# Patient Record
Sex: Male | Born: 1965 | Race: Black or African American | Hispanic: No | Marital: Single | Smoking: Never smoker
Health system: Southern US, Community
[De-identification: ages and names within clinical notes are randomized; demographics above are authoritative.]

## PROBLEM LIST (undated history)

## (undated) DIAGNOSIS — I5031 Acute diastolic (congestive) heart failure: Secondary | ICD-10-CM

## (undated) DIAGNOSIS — J449 Chronic obstructive pulmonary disease, unspecified: Secondary | ICD-10-CM

## (undated) DIAGNOSIS — J9601 Acute respiratory failure with hypoxia: Secondary | ICD-10-CM

## (undated) DIAGNOSIS — I509 Heart failure, unspecified: Secondary | ICD-10-CM

## (undated) DIAGNOSIS — I10 Essential (primary) hypertension: Secondary | ICD-10-CM

## (undated) HISTORY — PX: OTHER SURGICAL HISTORY: SHX169

---
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
## 2017-06-15 DIAGNOSIS — Z0000 Encounter for general adult medical examination without abnormal findings: Secondary | ICD-10-CM | POA: Diagnosis not present

## 2017-06-15 DIAGNOSIS — I10 Essential (primary) hypertension: Secondary | ICD-10-CM | POA: Diagnosis not present

## 2017-06-15 DIAGNOSIS — Z01118 Encounter for examination of ears and hearing with other abnormal findings: Secondary | ICD-10-CM | POA: Diagnosis not present

## 2017-06-15 DIAGNOSIS — Z9189 Other specified personal risk factors, not elsewhere classified: Secondary | ICD-10-CM | POA: Diagnosis not present

## 2017-06-15 DIAGNOSIS — Z131 Encounter for screening for diabetes mellitus: Secondary | ICD-10-CM | POA: Diagnosis not present

## 2017-06-15 DIAGNOSIS — Z136 Encounter for screening for cardiovascular disorders: Secondary | ICD-10-CM | POA: Diagnosis not present

## 2017-09-22 ENCOUNTER — Institutional Professional Consult (permissible substitution): Payer: Self-pay | Admitting: Internal Medicine

## 2018-05-17 DIAGNOSIS — R05 Cough: Secondary | ICD-10-CM | POA: Diagnosis not present

## 2018-05-17 DIAGNOSIS — R0689 Other abnormalities of breathing: Secondary | ICD-10-CM | POA: Diagnosis not present

## 2018-05-17 DIAGNOSIS — J189 Pneumonia, unspecified organism: Secondary | ICD-10-CM | POA: Diagnosis not present

## 2018-08-11 ENCOUNTER — Encounter: Payer: Self-pay | Admitting: Podiatry

## 2018-08-11 ENCOUNTER — Ambulatory Visit (INDEPENDENT_AMBULATORY_CARE_PROVIDER_SITE_OTHER): Payer: 59 | Admitting: Podiatry

## 2018-08-11 VITALS — BP 145/82 | HR 67

## 2018-08-11 DIAGNOSIS — L600 Ingrowing nail: Secondary | ICD-10-CM | POA: Diagnosis not present

## 2018-08-11 NOTE — Patient Instructions (Signed)
Soak Instructions    THE DAY AFTER THE PROCEDURE  Place 1/4 cup of epsom salts in a quart of warm tap water.  Submerge your foot or feet with outer bandage intact for the initial soak; this will allow the bandage to become moist and wet for easy lift off.  Once you remove your bandage, continue to soak in the solution for 20 minutes.  This soak should be done twice a day.  Next, remove your foot or feet from solution, blot dry the affected area and cover.  You may use a band aid large enough to cover the area or use gauze and tape.  Apply other medications to the area as directed by the doctor such as polysporin neosporin.  IF YOUR SKIN BECOMES IRRITATED WHILE USING THESE INSTRUCTIONS, IT IS OKAY TO SWITCH TO  Sapien VINEGAR AND WATER. Or you may use antibacterial soap and water to keep the toe clean  Monitor for any signs/symptoms of infection. Call the office immediately if any occur or go directly to the emergency room. Call with any questions/concerns.    Long Term Care Instructions-Post Nail Surgery  You have had your ingrown toenail and root treated with a chemical.  This chemical causes a burn that will drain and ooze like a blister.  This can drain for 6-8 weeks or longer.  It is important to keep this area clean, covered, and follow the soaking instructions dispensed at the time of your surgery.  This area will eventually dry and form a scab.  Once the scab forms you no longer need to soak or apply a dressing.  If at any time you experience an increase in pain, redness, swelling, or drainage, you should contact the office as soon as possible.  

## 2018-08-11 NOTE — Progress Notes (Signed)
Subjective:   Patient ID: Edwin Mcclain, male   DOB: 52 y.o.   MRN: [id number]   HPI Patient presents with a severely thickened painful right hallux nail that he cannot cut and is increasingly hard to wear shoe gear with.  States is been going on for a long time and is worsened over the last 6 months with gradual increase in pain and deformity.  Patient does not smoke likes to be active and is obese but does well with that with no medical problems currently   Review of Systems  All other systems reviewed and are negative.       Objective:  Physical Exam  Constitutional: He appears well-developed and well-nourished.  Cardiovascular: Intact distal pulses.  Pulmonary/Chest: Effort normal.  Musculoskeletal: Normal range of motion.  Neurological: He is alert.  Skin: Skin is warm.  Nursing note and vitals reviewed.   Neurovascular status intact muscle strength is adequate range of motion within normal limits with thickened dystrophic deformed hallux nail right that is painful when pressed with shoe gear difficult with no drainage noted or other structural pathology.  Good digital perfusion and well oriented x3     Assessment:  Damaged thickened hallux nail right with pain upon palpation     Plan:  H&P condition reviewed and recommended nail removal of a permanent variety due to the long-standing problems he is having.  Patient wants surgery on this understanding risk and I allowed him to sign a consent form.  Patient at this time had a right hallux and foot using sterile instrumentation the hallux nail was removed entirely matrix was exposed and phenol applied 5 applications 30 seconds followed by alcohol lavage sterile dressing.  Gave instructions on soaks and encouraged to call with any questions concerns he may have.  Dispensed surgical shoe with a graphite cover in order to allow him to work without having to wear closed in shoes

## 2018-08-12 ENCOUNTER — Other Ambulatory Visit: Payer: Self-pay

## 2018-08-12 ENCOUNTER — Encounter (HOSPITAL_COMMUNITY): Payer: Self-pay | Admitting: Emergency Medicine

## 2018-08-12 ENCOUNTER — Emergency Department (HOSPITAL_COMMUNITY)
Admission: EM | Admit: 2018-08-12 | Discharge: 2018-08-12 | Disposition: A | Discharge status: Home / Self Care | Payer: 59 | Attending: Emergency Medicine | Admitting: Emergency Medicine

## 2018-08-12 DIAGNOSIS — G8918 Other acute postprocedural pain: Secondary | ICD-10-CM | POA: Insufficient documentation

## 2018-08-12 DIAGNOSIS — M79674 Pain in right toe(s): Secondary | ICD-10-CM

## 2018-08-12 NOTE — ED Provider Notes (Signed)
[facility] EMERGENCY [facility]PARTMENT Provider Note  CSN: [id number] Arrival date & time: 08/12/18 [id number]  Chief Complaint(s) Foot Pain  HPI Edwin Mcclain is a 52 y.o. male who had a right great toe nail removed less than 12 hours ago presents to the emergency department with throbbing pain of the right toe following the procedure.  He denies any associated trauma.  Pain is exacerbated with ambulation and palpation.  Has tried taking over-the-counter Motrin and Tylenol which initially provided relief however over the past 6 hours the medication is not helped.  He denies any notable swelling.  No fevers or chills.  No redness or discharge.  HPI  Past Medical History History reviewed. No pertinent past medical history. There are no active problems to display for this patient.  Home Medication(s) Prior to Admission medications   Not on File                                                                                                                                    Past Surgical History History reviewed. No pertinent surgical history. Family History No family history on file.  Social History Social History   Tobacco Use  . Smoking status: Never Smoker  . Smokeless tobacco: Never Used  Substance Use Topics  . Alcohol use: Never    Frequency: Never  . Drug use: Never   Allergies Patient has no known allergies.  Review of Systems Review of Systems As noted in HPI Physical Exam Vital Signs  I have reviewed the triage vital signs BP (!) 175/73 (BP Location: Right Arm)   Pulse 94   Temp 98.5 F (36.9 C) (Oral)   Resp 18   Ht 5\' 11"  (1.803 m)   Wt (!) 158.8 kg   SpO2 95%   BMI 48.82 kg/m   Physical Exam  Constitutional: He is oriented to person, place, and time. He appears well-developed and well-nourished. No distress.  Morbidly obese.  HENT:  Head: Normocephalic and atraumatic.  Right Ear: External ear normal.  Left Ear: External ear normal.    Nose: Nose normal.  Mouth/Throat: Mucous membranes are normal. No trismus in the jaw.  Eyes: Conjunctivae and EOM are normal. No scleral icterus.  Neck: Normal range of motion and phonation normal.  Cardiovascular: Normal rate and regular rhythm.  Pulmonary/Chest: Effort normal. No stridor. No respiratory distress.  Abdominal: He exhibits no distension.  Musculoskeletal: Normal range of motion. He exhibits no edema.       Feet:  Neurological: He is alert and oriented to person, place, and time.  Skin: He is not diaphoretic.  Psychiatric: He has a normal mood and affect. His behavior is normal.  Vitals reviewed.   ED Results and Treatments Labs (all labs ordered are listed, but only abnormal results are displayed) Labs Reviewed - No data to display  EKG  EKG Interpretation  Date/Time:    Ventricular Rate:    PR Interval:    QRS Duration:   QT Interval:    QTC Calculation:   R Axis:     Text Interpretation:        Radiology No results found. Pertinent labs & imaging results that were available during my care of the patient were reviewed by me and considered in my medical decision making (see chart for details).  Medications Ordered in ED Medications - No data to display                                                                                                                                  Procedures Procedures  (including critical care time)  Medical Decision Making / ED Course I have reviewed the nursing notes for this encounter and the patient's prior records (if available in EHR or on provided paperwork).    Right great toe pain following nail plate removal.  No evidence of superimposed infection.  Patient reported that pain improved after pressure dressing was removed.  Dressing reapplied.  Recommended continued over-the-counter medication  and elevation of the right lower extremity.   The patient is safe for discharge with strict return precautions.   Final Clinical Impression(s) / ED Diagnoses Final diagnoses:  Great toe pain, right   Disposition: Discharge  Condition: Good  I have discussed the results, Dx and Tx plan with the patient who expressed understanding and agree(s) with the plan. Discharge instructions discussed at great length. The patient was given strict return precautions who verbalized understanding of the instructions. No further questions at time of discharge.    ED Discharge Orders    None       Follow Up: Podiatrist   as scheduled      This chart was dictated using voice recognition software.  Despite best efforts to proofread,  errors can occur which can change the documentation meaning.   Nira Conn, MD 08/12/18 [phone]

## 2018-08-12 NOTE — ED Triage Notes (Addendum)
Pt reports 9/10 right foot pain that got worse today after big toe nail removal. Denies any injuries. Hx of HTN and has not taken medications yet today.

## 2018-08-12 NOTE — Discharge Instructions (Signed)
Elevate your right foot above your heart as this will help decrease foot swelling and pain.

## 2018-08-15 ENCOUNTER — Encounter: Payer: Self-pay | Admitting: *Deleted

## 2018-08-15 ENCOUNTER — Telehealth: Payer: Self-pay | Admitting: Podiatry

## 2018-08-15 NOTE — Telephone Encounter (Signed)
This is [employer] in regards to a disability claim filed by Edwin Mcclain. In order to make a benefits decision we need to verify some medical information. If you could please return our call to [phone] claim# [id number].

## 2018-08-15 NOTE — Telephone Encounter (Signed)
I told pt that the choice to go back into regular work shoes was a pt's decision and was generally based on the pt's comfort in his work shoe. Pt then asked if had comfortable work shoes, then he could go back into them. I told him yes. Pt asked if he could get a note. I told him I would have it ready for his pick up today before 5:00pm.

## 2018-08-15 NOTE — Telephone Encounter (Signed)
Pts job will not let him wear the boot. They would like to know how long he will be in boot.

## 2018-08-16 ENCOUNTER — Telehealth: Payer: Self-pay | Admitting: Podiatry

## 2018-08-16 NOTE — Telephone Encounter (Signed)
Called and spoke with Thayer Ohmhris at [employer] in regards to the voicemail in regards to a disability claim for Mr. Ruppe. I gave him my phone number and the number of [phone] for Marylu LundJanet who deals with our disability paperwork. I told him she is just here on Wednesday's and Thursday's but I received the fax and will pass it along to [facility] for her to take care of it.

## 2018-08-16 NOTE — Telephone Encounter (Signed)
This is [employer] calling in regards to a disability claim that was reported by Edwin Mcclain. In order to make a benefits decision, we need to verify some medical information that was provided by the pt. Please return our call to [phone] with claim# [id number].

## 2019-02-07 ENCOUNTER — Encounter (HOSPITAL_COMMUNITY): Payer: Self-pay

## 2019-02-07 ENCOUNTER — Ambulatory Visit (INDEPENDENT_AMBULATORY_CARE_PROVIDER_SITE_OTHER)
Admission: EM | Admit: 2019-02-07 | Discharge: 2019-02-07 | Disposition: A | Discharge status: Home / Self Care | Payer: 59 | Source: Home / Self Care | Attending: Family Medicine | Admitting: Family Medicine

## 2019-02-07 ENCOUNTER — Inpatient Hospital Stay (HOSPITAL_COMMUNITY)
Admission: EM | Admit: 2019-02-07 | Discharge: 2019-02-21 | DRG: 291 | Disposition: A | Discharge status: Home / Self Care | Payer: 59 | Attending: Family Medicine | Admitting: Family Medicine

## 2019-02-07 ENCOUNTER — Ambulatory Visit (INDEPENDENT_AMBULATORY_CARE_PROVIDER_SITE_OTHER): Payer: 59

## 2019-02-07 DIAGNOSIS — I110 Hypertensive heart disease with heart failure: Principal | ICD-10-CM | POA: Diagnosis present

## 2019-02-07 DIAGNOSIS — Z9114 Patient's other noncompliance with medication regimen: Secondary | ICD-10-CM

## 2019-02-07 DIAGNOSIS — J9602 Acute respiratory failure with hypercapnia: Secondary | ICD-10-CM | POA: Diagnosis present

## 2019-02-07 DIAGNOSIS — G4733 Obstructive sleep apnea (adult) (pediatric): Secondary | ICD-10-CM | POA: Diagnosis present

## 2019-02-07 DIAGNOSIS — R05 Cough: Secondary | ICD-10-CM | POA: Diagnosis not present

## 2019-02-07 DIAGNOSIS — I10 Essential (primary) hypertension: Secondary | ICD-10-CM | POA: Diagnosis present

## 2019-02-07 DIAGNOSIS — I509 Heart failure, unspecified: Secondary | ICD-10-CM

## 2019-02-07 DIAGNOSIS — I272 Pulmonary hypertension, unspecified: Secondary | ICD-10-CM | POA: Diagnosis present

## 2019-02-07 DIAGNOSIS — J9601 Acute respiratory failure with hypoxia: Secondary | ICD-10-CM | POA: Diagnosis present

## 2019-02-07 DIAGNOSIS — E66813 Obesity, class 3: Secondary | ICD-10-CM | POA: Diagnosis present

## 2019-02-07 DIAGNOSIS — Z23 Encounter for immunization: Secondary | ICD-10-CM

## 2019-02-07 DIAGNOSIS — R059 Cough, unspecified: Secondary | ICD-10-CM

## 2019-02-07 DIAGNOSIS — E611 Iron deficiency: Secondary | ICD-10-CM | POA: Diagnosis present

## 2019-02-07 DIAGNOSIS — I5031 Acute diastolic (congestive) heart failure: Secondary | ICD-10-CM | POA: Diagnosis present

## 2019-02-07 DIAGNOSIS — I959 Hypotension, unspecified: Secondary | ICD-10-CM | POA: Diagnosis present

## 2019-02-07 DIAGNOSIS — R0602 Shortness of breath: Secondary | ICD-10-CM

## 2019-02-07 DIAGNOSIS — F17210 Nicotine dependence, cigarettes, uncomplicated: Secondary | ICD-10-CM | POA: Diagnosis present

## 2019-02-07 DIAGNOSIS — Z6841 Body Mass Index (BMI) 40.0 and over, adult: Secondary | ICD-10-CM

## 2019-02-07 DIAGNOSIS — N179 Acute kidney failure, unspecified: Secondary | ICD-10-CM | POA: Diagnosis present

## 2019-02-07 DIAGNOSIS — R079 Chest pain, unspecified: Secondary | ICD-10-CM | POA: Diagnosis not present

## 2019-02-07 DIAGNOSIS — J810 Acute pulmonary edema: Secondary | ICD-10-CM

## 2019-02-07 HISTORY — DX: Essential (primary) hypertension: I10

## 2019-02-07 HISTORY — DX: Morbid (severe) obesity due to excess calories: E66.01

## 2019-02-07 LAB — BASIC METABOLIC PANEL
ANION GAP: 7 (ref 5–15)
BUN: 8 mg/dL (ref 6–20)
CO2: 34 mmol/L — ABNORMAL HIGH (ref 22–32)
Calcium: 8.5 mg/dL — ABNORMAL LOW (ref 8.9–10.3)
Chloride: 100 mmol/L (ref 98–111)
Creatinine, Ser: 1.04 mg/dL (ref 0.61–1.24)
GFR calc Af Amer: >60 mL/min (ref >60)
GFR calc non Af Amer: >60 mL/min (ref >60)
GLUCOSE: 100 mg/dL — AB (ref 70–99)
Potassium: 4.4 mmol/L (ref 3.5–5.1)
Sodium: 141 mmol/L (ref 135–145)

## 2019-02-07 LAB — CBC
HCT: 44.1 % (ref 39.0–52.0)
Hemoglobin: 12 g/dL — ABNORMAL LOW (ref 13.0–17.0)
MCH: 19.7 pg — ABNORMAL LOW (ref 26.0–34.0)
MCHC: 27.2 g/dL — ABNORMAL LOW (ref 30.0–36.0)
MCV: 72.3 fL — ABNORMAL LOW (ref 80.0–100.0)
Platelets: 241 10*3/uL (ref 150–400)
RBC: 6.1 MIL/uL — AB (ref 4.22–5.81)
RDW: 21.8 % — ABNORMAL HIGH (ref 11.5–15.5)
WBC: 6.1 10*3/uL (ref 4.0–10.5)
nRBC: 0 % (ref 0.0–0.2)

## 2019-02-07 LAB — BRAIN NATRIURETIC PEPTIDE: B Natriuretic Peptide: 199.7 pg/mL — ABNORMAL HIGH (ref 0.0–100.0)

## 2019-02-07 MED ORDER — ALBUTEROL SULFATE (2.5 MG/3ML) 0.083% IN NEBU
5.0000 mg | INHALATION_SOLUTION | Freq: Once | RESPIRATORY_TRACT | Status: AC
  Administered 2019-02-07: 5 mg via RESPIRATORY_TRACT
  Filled 2019-02-07: qty 6

## 2019-02-07 NOTE — Discharge Instructions (Addendum)
Please go to the ER for further evaluation and management.

## 2019-02-07 NOTE — ED Provider Notes (Signed)
[facility]    CSN: [id number] Arrival date & time: 02/07/19  1424     History   Chief Complaint Chief Complaint  Patient presents with  . Facial Swelling    Since Yesterday  . URI    2 Weeks    HPI Ezreal Marshburn is a 53 y.o. male.   Patient is a 53 year old male with past medical history of hypertension.  He presents with approximate 2 weeks of congestion, cough.  He has been coughing up Gettel, clear sputum.  No associated fever, chills, night sweats.  No recent traveling or sick contacts.  He has become more short of breath with exertion and had some orthopnea.  He has had some lower extremity edema which is been mostly chronic for him.  He has not been on blood pressure medication for a long time and does not currently have a PCP.  He denies any history of CHF, PE, asthma.  Denies any current chest pain, palpitations, dizziness.   ROS per HPI      History reviewed. No pertinent past medical history.  There are no active problems to display for this patient.   History reviewed. No pertinent surgical history.     Home Medications    Prior to Admission medications   Not on File    Family History Family History  Family history unknown: Yes    Social History Social History   Tobacco Use  . Smoking status: Never Smoker  . Smokeless tobacco: Never Used  Substance Use Topics  . Alcohol use: Never    Frequency: Never  . Drug use: Never     Allergies   Patient has no known allergies.   Review of Systems Review of Systems   Physical Exam Triage Vital Signs ED Triage Vitals  Enc Vitals Group     BP 02/07/19 1505 (!) 145/93     Pulse Rate 02/07/19 1505 74     Resp 02/07/19 1505 16     Temp 02/07/19 1505 98.9 F (37.2 C)     Temp Source 02/07/19 1505 Oral     SpO2 02/07/19 1505 94 %     Weight --      Height --      Head Circumference --      Peak Flow --      Pain Score 02/07/19 1504 0     Pain Loc --      Pain Edu? --    Excl. in GC? --    No data found.  Updated Vital Signs BP (!) 145/93 (BP Location: Right Arm)   Pulse 74   Temp 98.9 F (37.2 C) (Oral)   Resp 16   SpO2 94%   Visual Acuity Right Eye Distance:   Left Eye Distance:   Bilateral Distance:    Right Eye Near:   Left Eye Near:    Bilateral Near:     Physical Exam Constitutional:      General: He is not in acute distress.    Appearance: He is obese. He is not ill-appearing, toxic-appearing or diaphoretic.  HENT:     Head: Normocephalic and atraumatic.     Right Ear: Tympanic membrane and ear canal normal.     Left Ear: Tympanic membrane and ear canal normal.     Nose: Nose normal.     Mouth/Throat:     Pharynx: Oropharynx is clear.  Eyes:     Conjunctiva/sclera: Conjunctivae normal.  Neck:     Musculoskeletal:  Normal range of motion.  Cardiovascular:     Rate and Rhythm: Normal rate and regular rhythm.  Pulmonary:     Effort: Pulmonary effort is normal.     Comments: Decreased lung sounds in all fields Musculoskeletal: Normal range of motion.     Right lower leg: Edema present.     Left lower leg: Edema present.  Skin:    General: Skin is warm and dry.  Neurological:     Mental Status: He is alert.  Psychiatric:        Mood and Affect: Mood normal.      UC Treatments / Results  Labs (all labs ordered are listed, but only abnormal results are displayed) Labs Reviewed - No data to display  EKG None  Radiology Dg Chest 2 View  Result Date: 02/07/2019 CLINICAL DATA:  Patient states that he has cough x 2 weeks, sob, center chest pain. Current social smoker. Hx of htn EXAM: CHEST - 2 VIEW COMPARISON:  None. FINDINGS: The heart is enlarged. There is marked pulmonary vascular congestion and perihilar edema. Small bilateral pleural effusions are present. No consolidations. IMPRESSION: Cardiomegaly and pulmonary edema. Electronically Signed   By: Norva Pavlov M.D.   On: 02/07/2019 16:13    Procedures Procedures  (including critical care time)  Medications Ordered in UC Medications - No data to display  Initial Impression / Assessment and Plan / UC Course  I have reviewed the triage vital signs and the nursing notes.  Pertinent labs & imaging results that were available during my care of the patient were reviewed by me and considered in my medical decision making (see chart for details).     Patient is a 53 year old male with approximate 2 weeks of cough, congestion He has had increased shortness of breath with exertion and orthopnea. He has had bilateral lower extremity edema Denies any history of heart failure, PE, asthma or COPD. He does admit to a history of hypertension but is not currently taking any medication for that He is morbidly obese X-ray revealed cardiomegaly with bilateral pleural effusions Which is consistent with congestive heart failure.  He is symptomatic. Stable, nontoxic or ill-appearing. We will go ahead and send patient to the ER for further evaluation and management with diuresis and possible cardiology consult Patient understanding and agree to plan Final Clinical Impressions(s) / UC Diagnoses   Final diagnoses:  Cough  Acute pulmonary edema [facility])     Discharge Instructions     Please go to the ER for further evaluation and management.    ED Prescriptions    None     Controlled Substance Prescriptions [place] Controlled Substance Registry consulted? Not Applicable   Janace Aris, NP 02/07/19 [phone]

## 2019-02-07 NOTE — ED Triage Notes (Signed)
Pt here from UC with XRAY showing bilateral plural effusions. Hypoxic in triage at 72% on room air.  Diminished lungs bilaterally.  No lung or heart hx.  Pt states hes had a cough for last 2 weeks.  A&Ox4. 95% on 4L O2

## 2019-02-07 NOTE — ED Triage Notes (Signed)
Pt presents with cold symptoms; congestion, nasal drainage, and cough X 2 weeks.  Pt also has complaints of facial swelling since last night that he believes is from something he ate.

## 2019-02-08 ENCOUNTER — Inpatient Hospital Stay (HOSPITAL_COMMUNITY): Payer: 59

## 2019-02-08 ENCOUNTER — Other Ambulatory Visit: Payer: Self-pay

## 2019-02-08 ENCOUNTER — Encounter (HOSPITAL_COMMUNITY): Payer: Self-pay | Admitting: Internal Medicine

## 2019-02-08 DIAGNOSIS — J9601 Acute respiratory failure with hypoxia: Secondary | ICD-10-CM

## 2019-02-08 DIAGNOSIS — I509 Heart failure, unspecified: Secondary | ICD-10-CM

## 2019-02-08 DIAGNOSIS — I110 Hypertensive heart disease with heart failure: Secondary | ICD-10-CM | POA: Diagnosis not present

## 2019-02-08 DIAGNOSIS — I10 Essential (primary) hypertension: Secondary | ICD-10-CM | POA: Diagnosis present

## 2019-02-08 DIAGNOSIS — E611 Iron deficiency: Secondary | ICD-10-CM | POA: Diagnosis present

## 2019-02-08 DIAGNOSIS — G4733 Obstructive sleep apnea (adult) (pediatric): Secondary | ICD-10-CM | POA: Diagnosis not present

## 2019-02-08 DIAGNOSIS — I272 Pulmonary hypertension, unspecified: Secondary | ICD-10-CM | POA: Diagnosis not present

## 2019-02-08 DIAGNOSIS — I5031 Acute diastolic (congestive) heart failure: Secondary | ICD-10-CM

## 2019-02-08 DIAGNOSIS — I959 Hypotension, unspecified: Secondary | ICD-10-CM | POA: Diagnosis present

## 2019-02-08 DIAGNOSIS — R0602 Shortness of breath: Secondary | ICD-10-CM

## 2019-02-08 DIAGNOSIS — Z6841 Body Mass Index (BMI) 40.0 and over, adult: Secondary | ICD-10-CM | POA: Diagnosis not present

## 2019-02-08 DIAGNOSIS — J810 Acute pulmonary edema: Secondary | ICD-10-CM | POA: Diagnosis not present

## 2019-02-08 DIAGNOSIS — Z9114 Patient's other noncompliance with medication regimen: Secondary | ICD-10-CM | POA: Diagnosis not present

## 2019-02-08 DIAGNOSIS — J9602 Acute respiratory failure with hypercapnia: Secondary | ICD-10-CM | POA: Diagnosis not present

## 2019-02-08 DIAGNOSIS — F17210 Nicotine dependence, cigarettes, uncomplicated: Secondary | ICD-10-CM | POA: Diagnosis present

## 2019-02-08 DIAGNOSIS — Z23 Encounter for immunization: Secondary | ICD-10-CM | POA: Diagnosis not present

## 2019-02-08 DIAGNOSIS — N179 Acute kidney failure, unspecified: Secondary | ICD-10-CM | POA: Diagnosis present

## 2019-02-08 HISTORY — DX: Acute respiratory failure with hypoxia: J96.01

## 2019-02-08 HISTORY — DX: Acute diastolic (congestive) heart failure: I50.31

## 2019-02-08 LAB — HIV ANTIBODY (ROUTINE TESTING W REFLEX): HIV Screen 4th Generation wRfx: NONREACTIVE

## 2019-02-08 LAB — POCT I-STAT 7, (LYTES, BLD GAS, ICA,H+H)
Acid-Base Excess: 7 mmol/L — ABNORMAL HIGH (ref 0.0–2.0)
BICARBONATE: 37.5 mmol/L — AB (ref 20.0–28.0)
Calcium, Ion: 1.19 mmol/L (ref 1.15–1.40)
HCT: 41 % (ref 39.0–52.0)
Hemoglobin: 13.9 g/dL (ref 13.0–17.0)
O2 Saturation: 91 %
PH ART: 7.238 — AB (ref 7.350–7.450)
Potassium: 4.2 mmol/L (ref 3.5–5.1)
Sodium: 139 mmol/L (ref 135–145)
TCO2: 40 mmol/L — ABNORMAL HIGH (ref 22–32)
pCO2 arterial: 87.9 mmHg (ref 32.0–48.0)
pO2, Arterial: 77 mmHg — ABNORMAL LOW (ref 83.0–108.0)

## 2019-02-08 LAB — ECHOCARDIOGRAM COMPLETE
HEIGHTINCHES: 71 in
Weight: 6480 oz

## 2019-02-08 LAB — TROPONIN I
Troponin I: <0.03 ng/mL (ref <0.03)
Troponin I: <0.03 ng/mL (ref <0.03)
Troponin I: <0.03 ng/mL (ref <0.03)

## 2019-02-08 LAB — MAGNESIUM: MAGNESIUM: 2.1 mg/dL (ref 1.7–2.4)

## 2019-02-08 MED ORDER — LISINOPRIL 5 MG PO TABS
5.0000 mg | ORAL_TABLET | Freq: Every day | ORAL | Status: DC
  Administered 2019-02-09 – 2019-02-16 (×8): 5 mg via ORAL
  Filled 2019-02-08 (×8): qty 1

## 2019-02-08 MED ORDER — ALBUTEROL SULFATE (2.5 MG/3ML) 0.083% IN NEBU
5.0000 mg | INHALATION_SOLUTION | Freq: Once | RESPIRATORY_TRACT | Status: AC
  Administered 2019-02-08: 5 mg via RESPIRATORY_TRACT
  Filled 2019-02-08: qty 6

## 2019-02-08 MED ORDER — FUROSEMIDE 10 MG/ML IJ SOLN
40.0000 mg | Freq: Once | INTRAMUSCULAR | Status: AC
  Administered 2019-02-08: 40 mg via INTRAMUSCULAR

## 2019-02-08 MED ORDER — SODIUM CHLORIDE 0.9% FLUSH
10.0000 mL | INTRAVENOUS | Status: DC | PRN
  Administered 2019-02-17: 10 mL
  Filled 2019-02-08: qty 40

## 2019-02-08 MED ORDER — ALBUTEROL SULFATE (2.5 MG/3ML) 0.083% IN NEBU: 2.5000 mg | INHALATION_SOLUTION | RESPIRATORY_TRACT | Status: DC | PRN

## 2019-02-08 MED ORDER — PERFLUTREN LIPID MICROSPHERE
1.0000 mL | INTRAVENOUS | Status: AC | PRN
  Administered 2019-02-08: 3 mL via INTRAVENOUS
  Filled 2019-02-08: qty 10

## 2019-02-08 MED ORDER — ONDANSETRON HCL 4 MG/2ML IJ SOLN: 4.0000 mg | Freq: Four times a day (QID) | INTRAMUSCULAR | Status: DC | PRN

## 2019-02-08 MED ORDER — HYDRALAZINE HCL 20 MG/ML IJ SOLN: 5.0000 mg | INTRAMUSCULAR | Status: DC | PRN

## 2019-02-08 MED ORDER — FUROSEMIDE 10 MG/ML IJ SOLN
40.0000 mg | Freq: Once | INTRAMUSCULAR | Status: DC
  Filled 2019-02-08: qty 4

## 2019-02-08 MED ORDER — DM-GUAIFENESIN ER 30-600 MG PO TB12: 1.0000 | ORAL_TABLET | Freq: Two times a day (BID) | ORAL | Status: DC | PRN

## 2019-02-08 MED ORDER — ASPIRIN EC 81 MG PO TBEC
81.0000 mg | DELAYED_RELEASE_TABLET | Freq: Every day | ORAL | Status: DC
  Administered 2019-02-09 – 2019-02-21 (×13): 81 mg via ORAL
  Filled 2019-02-08 (×13): qty 1

## 2019-02-08 MED ORDER — FUROSEMIDE 10 MG/ML IJ SOLN
40.0000 mg | Freq: Every day | INTRAMUSCULAR | Status: DC
  Administered 2019-02-08 – 2019-02-12 (×5): 40 mg via INTRAVENOUS
  Filled 2019-02-08 (×5): qty 4

## 2019-02-08 MED ORDER — SODIUM CHLORIDE 0.9% FLUSH
3.0000 mL | Freq: Two times a day (BID) | INTRAVENOUS | Status: DC
  Administered 2019-02-08 – 2019-02-13 (×12): 3 mL via INTRAVENOUS
  Administered 2019-02-13: 10 mL via INTRAVENOUS
  Administered 2019-02-14 – 2019-02-15 (×4): 3 mL via INTRAVENOUS
  Administered 2019-02-16: 10 mL via INTRAVENOUS
  Administered 2019-02-16 – 2019-02-21 (×5): 3 mL via INTRAVENOUS

## 2019-02-08 MED ORDER — ACETAMINOPHEN 325 MG PO TABS
650.0000 mg | ORAL_TABLET | ORAL | Status: DC | PRN
  Administered 2019-02-12 – 2019-02-20 (×4): 650 mg via ORAL
  Filled 2019-02-08 (×4): qty 2

## 2019-02-08 MED ORDER — SODIUM CHLORIDE 0.9 % IV SOLN: 250.0000 mL | INTRAVENOUS | Status: DC | PRN

## 2019-02-08 MED ORDER — SODIUM CHLORIDE 0.9% FLUSH: 3.0000 mL | INTRAVENOUS | Status: DC | PRN

## 2019-02-08 MED ORDER — ENOXAPARIN SODIUM 40 MG/0.4ML ~~LOC~~ SOLN
40.0000 mg | SUBCUTANEOUS | Status: DC
  Administered 2019-02-08: 40 mg via SUBCUTANEOUS
  Filled 2019-02-08 (×3): qty 0.4

## 2019-02-08 NOTE — Progress Notes (Signed)
  RT called to bedside by RN for low spo2. Pt sleeping and placed on Bipap at this time. VS within normal limits. No distress noted. RT will continue to monitor

## 2019-02-08 NOTE — ED Notes (Signed)
Patient woke up and didn't realize where he was. He removed midline catheter and took off BiPAP. Patient A&O x 4 at this time. States "I guess I just woke up and didn't realize where I was. I'm so sorry".   IV team consulted again to replace midline catheter.

## 2019-02-08 NOTE — ED Notes (Signed)
Breakfast Tray Ordered. 

## 2019-02-08 NOTE — ED Notes (Addendum)
ED TO INPATIENT HANDOFF REPORT  ED Nurse Name and Phone #:   Arline AspCindy RN [phone]  S Name/Age/Gender Edwin Mcclain 53 y.o. male Room/Bed: 025C/025C  Code Status   Code Status: Full Code  Home/SNF/Other Home Patient oriented to: Person place time situation Is this baseline? Yes   Triage Complete: Triage complete  Chief Complaint Fluid on lungs - Sent by UC  Triage Note Pt here from UC with XRAY showing bilateral plural effusions. Hypoxic in triage at 72% on room air.  Diminished lungs bilaterally.  No lung or heart hx.  Pt states hes had a cough for last 2 weeks.  A&Ox4. 95% on 4L O2   Allergies No Known Allergies  Level of Care/Admitting Diagnosis ED Disposition    ED Disposition Condition Comment   Admit  [facility] [[id number]]  Level of Care: Progressive [102]  Diagnosis: Acute CHF (congestive heart failure) [facility](HCC) [[id number]]) [[id number]]  Admitting Physician: Lorretta HarpNIU, XILIN [[id number]]  Attending Physician: Lorretta HarpNIU, XILIN [phone][[id number]]  Estimated length of stay: past midnight tomorrow  Certification:: I certify this patient will need inpatient services for at least 2 midnights  PT Class (Do Not Modify): Inpatient [101]  PT Acc Code (Do Not Modify): Private [1]       B Medical/Surgery History Past Medical History:  Diagnosis Date  . HTN (hypertension)   . Morbid obesity (HCC)    Past Surgical History:  Procedure Laterality Date  . C-spine surgery       A IV Location/Drains/Wounds Patient Lines/Drains/Airways Status   Active Line/Drains/Airways    Name:   Placement date:   Placement time:   Site:   Days:   Midline Single Lumen 02/08/19 Midline Left Cephalic 8 cm 0 cm   02/08/19    0851    Cephalic   less than 1          Intake/Output Last 24 hours  Intake/Output Summary (Last 24 hours) at 02/08/2019 1152 Last data filed at 02/08/2019 1024 Gross per 24 hour  Intake -  Output 2200 ml  Net -2200 ml    Labs/Imaging Results for orders placed or performed  during the hospital encounter of 02/07/19 (from the past 48 hour(s))  Basic metabolic panel     Status: Abnormal   Collection Time: 02/07/19  5:30 PM  Result Value Ref Range   Sodium 141 135 - 145 mmol/L   Potassium 4.4 3.5 - 5.1 mmol/L   Chloride 100 98 - 111 mmol/L   CO2 34 (H) 22 - 32 mmol/L   Glucose, Bld 100 (H) 70 - 99 mg/dL   BUN 8 6 - 20 mg/dL   Creatinine, Ser 2.721.04 0.61 - 1.24 mg/dL   Calcium 8.5 (L) 8.9 - 10.3 mg/dL   GFR calc non Af Amer >60 >60 mL/min   GFR calc Af Amer >60 >60 mL/min   Anion gap 7 5 - 15    Comment: Performed at [facility], [address] [place], [place] [address]  CBC     Status: Abnormal   Collection Time: 02/07/19  5:30 PM  Result Value Ref Range   WBC 6.1 4.0 - 10.5 K/uL   RBC 6.10 (H) 4.22 - 5.81 MIL/uL   Hemoglobin 12.0 (L) 13.0 - 17.0 g/dL   HCT 40.344.1 47.439.0 - 25.952.0 %   MCV 72.3 (L) 80.0 - 100.0 fL   MCH 19.7 (L) 26.0 - 34.0 pg   MCHC 27.2 (L) 30.0 - 36.0 g/dL   RDW  21.8 (H) 11.5 - 15.5 %   Platelets 241 150 - 400 K/uL   nRBC 0.0 0.0 - 0.2 %    Comment: Performed at [facility], [address] [place], [place] [address]  Brain natriuretic peptide     Status: Abnormal   Collection Time: 02/07/19 11:12 PM  Result Value Ref Range   B Natriuretic Peptide 199.7 (H) 0.0 - 100.0 pg/mL    Comment: Performed at [facility], [address] [place], [place] [address]  I-STAT 7, (LYTES, BLD GAS, ICA, H+H)     Status: Abnormal   Collection Time: 02/08/19  2:30 AM  Result Value Ref Range   pH, Arterial 7.238 (L) 7.350 - 7.450   pCO2 arterial 87.9 (HH) 32.0 - 48.0 mmHg   pO2, Arterial 77.0 (L) 83.0 - 108.0 mmHg   Bicarbonate 37.5 (H) 20.0 - 28.0 mmol/L   TCO2 40 (H) 22 - 32 mmol/L   O2 Saturation 91.0 %   Acid-Base Excess 7.0 (H) 0.0 - 2.0 mmol/L   Sodium 139 135 - 145 mmol/L   Potassium 4.2 3.5 - 5.1 mmol/L   Calcium, Ion 1.19 1.15 - 1.40 mmol/L   HCT 41.0 39.0 - 52.0 %   Hemoglobin 13.9 13.0 - 17.0 g/dL   Patient  temperature HIDE    Collection site RADIAL, ALLEN'S TEST ACCEPTABLE    Drawn by RT    Sample type ARTERIAL    Comment NOTIFIED PHYSICIAN   Magnesium     Status: None   Collection Time: 02/08/19  5:02 AM  Result Value Ref Range   Magnesium 2.1 1.7 - 2.4 mg/dL    Comment: Performed at [facility], [address] [place], [place] [address]  Troponin I - Now Then Q6H     Status: None   Collection Time: 02/08/19  5:02 AM  Result Value Ref Range   Troponin I <0.03 <0.03 ng/mL    Comment: Performed at [facility], [address], [place], [place] [address]  Troponin I - Now Then Q6H     Status: None   Collection Time: 02/08/19 10:36 AM  Result Value Ref Range   Troponin I <0.03 <0.03 ng/mL    Comment: Performed at [facility], [address] [place], [place] [address]   Dg Chest 2 View  Result Date: 02/07/2019 CLINICAL DATA:  Patient states that he has cough x 2 weeks, sob, center chest pain. Current social smoker. Hx of htn EXAM: CHEST - 2 VIEW COMPARISON:  None. FINDINGS: The heart is enlarged. There is marked pulmonary vascular congestion and perihilar edema. Small bilateral pleural effusions are present. No consolidations. IMPRESSION: Cardiomegaly and pulmonary edema. Electronically Signed   By: Norva Pavlov M.D.   On: 02/07/2019 16:13    Pending Labs Unresulted Labs (From admission, onward)    Start     Ordered   02/09/19 0500  Basic metabolic panel  Daily,   R     02/08/19 0331   02/08/19 0334  Troponin I - Now Then Q6H  Now then every 6 hours,   R     02/08/19 0333   02/08/19 0329  HIV antibody (Routine Testing)  Once,   R     02/08/19 0331          Vitals/Pain Today's Vitals   02/08/19 0906 02/08/19 0915 02/08/19 1026 02/08/19 1109  BP: 133/78   125/80  Pulse: 94 90  90  Resp: Marland Kitchen)  22 17  16   Temp:      TempSrc:      SpO2: 99% 96%  94%  Weight:      Height:      PainSc:   Asleep     Isolation Precautions No active  isolations  Medications Medications  sodium chloride flush (NS) 0.9 % injection 3 mL (3 mLs Intravenous Given 02/08/19 1023)  sodium chloride flush (NS) 0.9 % injection 3 mL (has no administration in time range)  0.9 %  sodium chloride infusion (has no administration in time range)  acetaminophen (TYLENOL) tablet 650 mg (has no administration in time range)  ondansetron (ZOFRAN) injection 4 mg (has no administration in time range)  enoxaparin (LOVENOX) injection 40 mg (has no administration in time range)  lisinopril (PRINIVIL,ZESTRIL) tablet 5 mg (5 mg Oral Not Given 02/08/19 1016)  aspirin EC tablet 81 mg (81 mg Oral Not Given 02/08/19 1016)  hydrALAZINE (APRESOLINE) injection 5 mg (has no administration in time range)  furosemide (LASIX) injection 40 mg (40 mg Intravenous Given 02/08/19 1023)  albuterol (PROVENTIL) (2.5 MG/3ML) 0.083% nebulizer solution 2.5 mg (has no administration in time range)  dextromethorphan-guaiFENesin (MUCINEX DM) 30-600 MG per 12 hr tablet 1 tablet (has no administration in time range)  sodium chloride flush (NS) 0.9 % injection 10-40 mL (has no administration in time range)  albuterol (PROVENTIL) (2.5 MG/3ML) 0.083% nebulizer solution 5 mg (5 mg Nebulization Given 02/07/19 1737)  albuterol (PROVENTIL) (2.5 MG/3ML) 0.083% nebulizer solution 5 mg (5 mg Nebulization Given 02/08/19 0251)  furosemide (LASIX) injection 40 mg (40 mg Intramuscular Given 02/08/19 0425)    Mobility walks with device Low fall risk   Focused Assessments Cardiac Assessment Handoff:  Cardiac Rhythm: Normal sinus rhythm Lab Results  Component Value Date   TROPONINI <0.03 02/08/2019   No results found for: DDIMER Does the Patient currently have chest pain? No     R Recommendations: See Admitting Provider Note  Report given to:   Additional Notes: Cycling Troponins IV Lasix  Possibly ECHO planned  Strict I and O Pt states he has gained 100 plus pounds in past year.

## 2019-02-08 NOTE — ED Notes (Signed)
Edwin Mcclain(SR) Lunch Tray Ordered @ 0948-per RN-called by Dickson Kostelnik 

## 2019-02-08 NOTE — Progress Notes (Signed)
  Echocardiogram 2D Echocardiogram has been performed.  Delcie Roch 02/08/2019, 3:40 PM

## 2019-02-08 NOTE — ED Provider Notes (Signed)
[facility] EMERGENCY DEPARTMENT Provider Note   CSN: [id number] Arrival date & time: 02/07/19  1647    History   Chief Complaint Chief Complaint  Patient presents with  . Shortness of Breath    HPI Edwin Mcclain is a 53 y.o. male with a history of hypertension who presents to the emergency department from UC with a chief complaint of shortness of breath.  The patient endorses constant, worsening shortness of breath over the last 3 weeks.  He reports he can barely walk 15 to 20 feet without needing to stop and rest.  He reports prior to onset of symptoms he could walk as far as needed without getting short of breath.  Shortness of breath is worse with exertion and improved with rest.  He also reports worsening bilateral lower extremity edema and swelling in his abdomen and notes that his pants have been fitting more tightly.  He does not weigh himself at home.  He sleeps with 3 pillows at night, no recent change.  He reports increased drowsiness and feeling more sleepy.  He reports that almost anytime he sits down for more than 5 minutes that he falls asleep, even when he is sitting up right, which is new over the last few weeks.  He reports he was initially seen at urgent care had a chest x-ray and was advised to come to the ER for further work-up and evaluation.  On arrival to the ER, the patient was found to be satting at 73% and was placed on 4 L of oxygen via nasal cannula.  He reports associated productive cough with clear sputum, and chest congestion.  He reports earlier he was having subjective fever and chills, but the symptoms have since resolved.  He reports that he thought he had a cold and has been taking Mucinex DM, Robitussin, and eating oranges.   He denies chest pain, abdominal pain, nausea, vomiting, diarrhea, palpitations, dizziness, lightheadedness, headache, or body aches.  He reports he recently enrolled in an exercise program through work and has been  participating in stretching for 3 hours a week.  He has also recently changed his diet, but previously with mostly only eating fast food.  He is unsure of his family's cardiac history.  He reports he was previously diagnosed with hypertension and was taking medication, but has been off the medication for a long time and does not recall which medication he was taking.  He reports that he smokes socially and may smoke 1 to 2 cigarettes/month.     The history is provided by the patient. No language interpreter was used.    Past Medical History:  Diagnosis Date  . HTN (hypertension)     Patient Active Problem List   Diagnosis Date Noted  . Acute CHF (congestive heart failure) (HCC) 02/08/2019  . Acute respiratory failure with hypoxia (HCC) 02/08/2019  . HTN (hypertension)     History reviewed. No pertinent surgical history.      Home Medications    Prior to Admission medications   Not on File    Family History Family History  Family history unknown: Yes    Social History Social History   Tobacco Use  . Smoking status: Never Smoker  . Smokeless tobacco: Never Used  Substance Use Topics  . Alcohol use: Never    Frequency: Never  . Drug use: Never     Allergies   Patient has no known allergies.   Review of Systems Review of Systems  Constitutional: Positive for chills (resolved) and fever (resolved). Negative for appetite change.  HENT: Positive for congestion. Negative for rhinorrhea and sore throat.   Eyes: Negative for visual disturbance.  Respiratory: Positive for cough, shortness of breath and wheezing. Negative for chest tightness.   Cardiovascular: Positive for leg swelling. Negative for chest pain and palpitations.  Gastrointestinal: Positive for abdominal distention. Negative for abdominal pain, diarrhea, nausea and vomiting.  Genitourinary: Negative for dysuria, hematuria and urgency.  Musculoskeletal: Negative for back pain, neck pain and neck  stiffness.  Skin: Negative for rash.  Allergic/Immunologic: Negative for immunocompromised state.  Neurological: Negative for dizziness, syncope, weakness, numbness and headaches.  Psychiatric/Behavioral: Negative for confusion.     Physical Exam Updated Vital Signs BP 111/65   Pulse 84   Temp 99.2 F (37.3 C) (Oral)   Resp 14   Ht 5\' 11"  (1.803 m)   Wt (!) 196.9 kg   SpO2 98%   BMI 60.53 kg/m   Physical Exam Vitals signs and nursing note reviewed.  Constitutional:      Appearance: He is well-developed.     Comments: Morbidly obese.  Nasal cannula is in place.  HENT:     Head: Normocephalic.  Eyes:     Conjunctiva/sclera: Conjunctivae normal.  Neck:     Musculoskeletal: Neck supple.  Cardiovascular:     Rate and Rhythm: Normal rate and regular rhythm.     Heart sounds: No murmur.  Pulmonary:     Effort: Pulmonary effort is normal.     Breath sounds: Wheezing present.     Comments: Scattered wheezes in the mid and upper lung fields bilaterally.  Lungs diminished bilaterally in all fields.    Exam is somewhat limited secondary to body habitus.  No retractions or accessory muscle use. Chest:     Chest wall: No tenderness, crepitus or edema. There is no dullness to percussion.  Abdominal:     General: There is no distension.     Palpations: Abdomen is soft.     Comments: Abdomen is distended, but soft and nontender.  Musculoskeletal:     Comments: Edema noted to the bilateral lower extremities, which appears chronic.  Skin:    General: Skin is warm and dry.     Coloration: Skin is not cyanotic.  Neurological:     Mental Status: He is alert.  Psychiatric:        Behavior: Behavior normal.      ED Treatments / Results  Labs (all labs ordered are listed, but only abnormal results are displayed) Labs Reviewed  BASIC METABOLIC PANEL - Abnormal; Notable for the following components:      Result Value   CO2 34 (*)    Glucose, Bld 100 (*)    Calcium 8.5 (*)     All other components within normal limits  CBC - Abnormal; Notable for the following components:   RBC 6.10 (*)    Hemoglobin 12.0 (*)    MCV 72.3 (*)    MCH 19.7 (*)    MCHC 27.2 (*)    RDW 21.8 (*)    All other components within normal limits  BRAIN NATRIURETIC PEPTIDE - Abnormal; Notable for the following components:   B Natriuretic Peptide 199.7 (*)    All other components within normal limits  POCT I-STAT 7, (LYTES, BLD GAS, ICA,H+H) - Abnormal; Notable for the following components:   pH, Arterial 7.238 (*)    pCO2 arterial 87.9 (*)    pO2, Arterial 77.0 (*)  Bicarbonate 37.5 (*)    TCO2 40 (*)    Acid-Base Excess 7.0 (*)    All other components within normal limits  I-STAT ARTERIAL BLOOD GAS, ED    EKG EKG Interpretation  Date/Time:  Tuesday February 07 2019 17:19:03 EST Ventricular Rate:  94 PR Interval:  140 QRS Duration: 78 QT Interval:  342 QTC Calculation: 427 R Axis:   46 Text Interpretation:  Normal sinus rhythm Anterolateral infarct , age undetermined Abnormal ECG No old tracing to compare Confirmed by Ward, Baxter Hire [phone]) on 02/08/2019 1:32:57 AM   Radiology Dg Chest 2 View  Result Date: 02/07/2019 CLINICAL DATA:  Patient states that he has cough x 2 weeks, sob, center chest pain. Current social smoker. Hx of htn EXAM: CHEST - 2 VIEW COMPARISON:  None. FINDINGS: The heart is enlarged. There is marked pulmonary vascular congestion and perihilar edema. Small bilateral pleural effusions are present. No consolidations. IMPRESSION: Cardiomegaly and pulmonary edema. Electronically Signed   By: Norva Pavlov M.D.   On: 02/07/2019 16:13    Procedures .Critical Care Performed by: Barkley Boards, PA-C Authorized by: Barkley Boards, PA-C   Critical care provider statement:    Critical care time (minutes):  50   Critical care time was exclusive of:  Separately billable procedures and treating other patients and teaching time   Critical care was necessary to  treat or prevent imminent or life-threatening deterioration of the following conditions:  Respiratory failure   Critical care was time spent personally by me on the following activities:  Obtaining history from patient or surrogate, examination of patient, evaluation of patient's response to treatment, ordering and review of laboratory studies, ordering and review of radiographic studies, ordering and performing treatments and interventions, pulse oximetry, re-evaluation of patient's condition, review of old charts and development of treatment plan with patient or surrogate   (including critical care time)  Medications Ordered in ED Medications  furosemide (LASIX) injection 40 mg (has no administration in time range)  albuterol (PROVENTIL) (2.5 MG/3ML) 0.083% nebulizer solution 5 mg (5 mg Nebulization Given 02/07/19 1737)  albuterol (PROVENTIL) (2.5 MG/3ML) 0.083% nebulizer solution 5 mg (5 mg Nebulization Given 02/08/19 0251)     Initial Impression / Assessment and Plan / ED Course  I have reviewed the triage vital signs and the nursing notes.  Pertinent labs & imaging results that were available during my care of the patient were reviewed by me and considered in my medical decision making (see chart for details).  53 year old male with a history of hypertension presenting with shortness of breath, lower extremity edema, mild orthopnea, and abdominal swelling for more than 3 weeks.  He initially had viral URI symptoms, but fever and chills resolved.  He still has a productive cough with clear sputum and chest congestion.  He was found to be hypoxic in the low 70s on arrival to the ER.  He was placed on 4 L nasal cannula and SaO2 improved to 98%.  EKG with normal sinus rhythm and questionable age-indeterminate anterolateral infarct.  Chest x-ray with cardiomegaly and pulmonary edema.  BNP is 200.  Bicarb is elevated at 34.  Hemoglobin is 12.  No previous labs for comparison.  ABG is pending as I am  concerned the patient also has hypercarbia due to worsening drowsiness in addition to hypoxia.  40 mg of Lasix given in the ED.  He has been having no chest pain despite his symptoms.  Low suspicion for ACS, pericarditis, or myocarditis.  Consult to the hospitalist team and spoke with Dr. Clyde Lundborg who will accept the patient for admission.  Clinical Course as of Feb 09 256  Wed Feb 08, 2019  0236 ABG was obtained.  pH of 7.238 and PCO2 of 87.9 mmhg; PO2 77 mmhg. Bipap ordered. Update provided to Dr. Clyde Lundborg   [MM]    Clinical Course User Index [MM] Florette Thai A, PA-C    The patient appears reasonably stabilized for admission considering the current resources, flow, and capabilities available in the ED at this time, and I doubt any other [facility] requiring further screening and/or treatment in the ED prior to admission.      Final Clinical Impressions(s) / ED Diagnoses   Final diagnoses:  Congestive heart failure, unspecified HF chronicity, unspecified heart failure type (HCC)  Acute respiratory failure with hypoxia and hypercapnia [facility])    ED Discharge Orders    None       Quantay Zaremba A, PA-C 02/08/19 0257    Ward, Layla Maw, DO 02/08/19 [phone]

## 2019-02-08 NOTE — H&P (Signed)
History and Physical    Mcihael Bach YIA:[id number] DOB: [birth year] DOA: 02/07/2019  Referring MD/NP/PA:   PCP: Patient, No Pcp Per   Patient coming from:  The patient is coming from home.  At baseline, pt is independent for most of ADL.        Chief Complaint: Shortness of breath, leg edema  HPI: Hadley Handrich is a 53 y.o. male with medical history significant of hypertension, morbid obesity, medication noncompliance, who presents with shortness breath and leg edema.  Patient states that he has been having shortness of breath for more than 2 weeks, which has been progressively getting worse.  Has dry cough, chills, but no fever.  No chest pain.  He also has bilateral lower leg edema.  Patient states that he has gained more than 100 pounds in the last year.  No nausea, vomiting, diarrhea, abdominal pain, symptoms of UTI or unilateral weakness.  Patient is not taking any blood pressure medications.  ED Course: pt was found to have BNP 199.7, WBC 6.1, electrolytes renal function okay, temperature 99.2, heart rate 80s, oxygen saturation 72% on room air, 92% on the 42 nasal cannula oxygen.  ABG (pH 7.238, CO2 87, PO2 79).  BiPAP was started.  Patient is admitted to stepdown as inpatient.  Review of Systems:   General: no fevers, has chills, has body weight gain, fatigue HEENT: no blurry vision, hearing changes or sore throat Respiratory: has dyspnea, coughing, no wheezing CV: no chest pain, no palpitations GI: no nausea, vomiting, abdominal pain, diarrhea, constipation GU: no dysuria, burning on urination, increased urinary frequency, hematuria  Ext: has leg edema Neuro: no unilateral weakness, numbness, or tingling, no vision change or hearing loss Skin: no rash, no skin tear. MSK: No muscle spasm, no deformity, no limitation of range of movement in spin Heme: No easy bruising.  Travel history: No recent long distant travel.  Allergy: No Known Allergies  Past Medical History:    Diagnosis Date  . HTN (hypertension)   . Morbid obesity (HCC)     Past Surgical History:  Procedure Laterality Date  . C-spine surgery      Social History:  reports that he has never smoked. He has never used smokeless tobacco. He reports that he does not drink alcohol or use drugs.  Family History:  Family History  Problem Relation Age of Onset  . Cancer Mother        Patient is not sure which type of cancer  . Leukemia Father      Prior to Admission medications   Not on File    Physical Exam: Vitals:   02/08/19 0145 02/08/19 0306 02/08/19 0315 02/08/19 0508  BP: 111/65   126/67  Pulse:   91 93  Resp: 14  18 16   Temp:      TempSrc:      SpO2:   97% 97%  Weight:  (!) 183.7 kg    Height:       General: Not in acute distress HEENT:       Eyes: PERRL, EOMI, no scleral icterus.       ENT: No discharge from the ears and nose, no pharynx injection, no tonsillar enlargement.        Neck: Difficult to assess JVD due to morbid obesity. no bruit, no mass felt. Heme: No neck lymph node enlargement. Cardiac: S1/S2, RRR, No murmurs, No gallops or rubs. Respiratory: No rales, wheezing, rhonchi or rubs. GI: Soft, nondistended, nontender, no rebound pain, no  organomegaly, BS present. GU: No hematuria Ext: 2+ pitting leg edema bilaterally. 2+DP/PT pulse bilaterally. Musculoskeletal: No joint deformities, No joint redness or warmth, no limitation of ROM in spin. Skin: No rashes.  Neuro: Alert, oriented X3, cranial nerves II-XII grossly intact, moves all extremities normally.  Psych: Patient is not psychotic, no suicidal or hemocidal ideation.  Labs on Admission: I have personally reviewed following labs and imaging studies  CBC: Recent Labs  Lab 02/07/19 1730 02/08/19 0230  WBC 6.1  --   HGB 12.0* 13.9  HCT 44.1 41.0  MCV 72.3*  --   PLT 241  --    Basic Metabolic Panel: Recent Labs  Lab 02/07/19 1730 02/08/19 0230  NA 141 139  K 4.4 4.2  CL 100  --   CO2 34*   --   GLUCOSE 100*  --   BUN 8  --   CREATININE 1.04  --   CALCIUM 8.5*  --    GFR: Estimated Creatinine Clearance: 141.1 mL/min (by C-G formula based on SCr of 1.04 mg/dL). Liver Function Tests: No results for input(s): AST, ALT, ALKPHOS, BILITOT, PROT, ALBUMIN in the last 168 hours. No results for input(s): LIPASE, AMYLASE in the last 168 hours. No results for input(s): AMMONIA in the last 168 hours. Coagulation Profile: No results for input(s): INR, PROTIME in the last 168 hours. Cardiac Enzymes: No results for input(s): CKTOTAL, CKMB, CKMBINDEX, TROPONINI in the last 168 hours. BNP (last 3 results) No results for input(s): PROBNP in the last 8760 hours. HbA1C: No results for input(s): HGBA1C in the last 72 hours. CBG: No results for input(s): GLUCAP in the last 168 hours. Lipid Profile: No results for input(s): CHOL, HDL, LDLCALC, TRIG, CHOLHDL, LDLDIRECT in the last 72 hours. Thyroid Function Tests: No results for input(s): TSH, T4TOTAL, FREET4, T3FREE, THYROIDAB in the last 72 hours. Anemia Panel: No results for input(s): VITAMINB12, FOLATE, FERRITIN, TIBC, IRON, RETICCTPCT in the last 72 hours. Urine analysis: No results found for: COLORURINE, APPEARANCEUR, LABSPEC, PHURINE, GLUCOSEU, HGBUR, BILIRUBINUR, KETONESUR, PROTEINUR, UROBILINOGEN, NITRITE, LEUKOCYTESUR Sepsis Labs: @LABRCNTIP (procalcitonin:4,lacticidven:4) )No results found for this or any previous visit (from the past 240 hour(s)).   Radiological Exams on Admission: Dg Chest 2 View  Result Date: 02/07/2019 CLINICAL DATA:  Patient states that he has cough x 2 weeks, sob, center chest pain. Current social smoker. Hx of htn EXAM: CHEST - 2 VIEW COMPARISON:  None. FINDINGS: The heart is enlarged. There is marked pulmonary vascular congestion and perihilar edema. Small bilateral pleural effusions are present. No consolidations. IMPRESSION: Cardiomegaly and pulmonary edema. Electronically Signed   By: Norva Pavlov  M.D.   On: 02/07/2019 16:13     EKG: Independently reviewed.  Sinus rhythm, QTC 427, low voltage, LAE, poor R wave progression, nonspecific T wave change.   Assessment/Plan Principal Problem:   Acute CHF (congestive heart failure) (HCC) Active Problems:   HTN (hypertension)   Acute respiratory failure with hypoxia (HCC)   Morbid obesity (HCC)  Acute respiratory failure with hypoxia due to acute CHF (congestive heart failure) [facility]): Patient has elevated BNP 199.7, 2+ leg edema, cardiomegaly and pulmonary edema on chest x-ray, consistent with acute CHF.  Patient has history of high blood pressure, not taking medications, likely to have diastolic CHF.  -will admit to SDU as inpt. - continue BiPAp -Lasix 40 mg daily by IV (pt is lasix nave) -trop x 3 -2d echo -start lisinopril 5 mg daily -Daily weights -strict I/O's  HTN; -started lisinopril -IV  hydralazine as needed  Morbid obesity:  -Diet and exercise.   -Encouraged to lose weight.    Inpatient status:  # Patient requires inpatient status due to high intensity of service, high risk for further deterioration and high frequency of surveillance required.  I certify that at the point of admission it is my clinical judgment that the patient will require inpatient hospital care spanning beyond 2 midnights from the point of admission.  . This patient has multiple chronic comorbidities including HTN and medication noncompliance . Now patient has presenting with acute new onset CHF . The worrisome physical exam findings include respiratory distress, bilateral leg edema . The initial radiographic and laboratory data are worrisome because of elevated BNP, cardiomegaly and vascular congestion and pulmonary edema chest x-ray . Current medical needs: please see my assessment and plan . Predictability of an adverse outcome (risk): Patient has acute onset CHF.  Patient has more than 100 pounds of weight gain in the last year, has 2+ leg  edema. Patient will need to be treated with IV Lasix for at least 2 days.      DVT ppx: SQ Lovenox Code Status: Full code Family Communication: None at bed side.     Disposition Plan:  Anticipate discharge back to previous home environment Consults called:  none Admission status: SDU/inpation       Date of Service 02/08/2019    Lorretta HarpXilin Ceanna Wareing [facility]   If 7PM-7AM, please contact night-coverage [email or web address] Password [email or web address] 02/08/2019, 6:17 AM

## 2019-02-08 NOTE — ED Notes (Signed)
IV unsuccessful at this time. MD [facility] made aware.   Called for second IV nurse.

## 2019-02-08 NOTE — Progress Notes (Signed)
Critical ABG values hand delivered to Marshia Ly, RN at 0230 on 02/08/2019

## 2019-02-08 NOTE — Progress Notes (Signed)
O2 sats dropped to 50% on 4lpm via [place].  Pt asymptomatic.  O2 increased to 5lpm via [place].  Pt sats 95% at this time.

## 2019-02-08 NOTE — ED Notes (Signed)
IV attempt x 3. I team consulted.

## 2019-02-08 NOTE — ED Notes (Signed)
Patient given cheese, crackers, and PO fluids before  BiPAP, OK per PA Mia.

## 2019-02-09 LAB — BASIC METABOLIC PANEL
Anion gap: 10 (ref 5–15)
BUN: 7 mg/dL (ref 6–20)
CO2: 37 mmol/L — AB (ref 22–32)
Calcium: 8.6 mg/dL — ABNORMAL LOW (ref 8.9–10.3)
Chloride: 95 mmol/L — ABNORMAL LOW (ref 98–111)
Creatinine, Ser: 1.01 mg/dL (ref 0.61–1.24)
GFR calc Af Amer: >60 mL/min (ref >60)
GFR calc non Af Amer: >60 mL/min (ref >60)
Glucose, Bld: 92 mg/dL (ref 70–99)
Potassium: 3.9 mmol/L (ref 3.5–5.1)
Sodium: 142 mmol/L (ref 135–145)

## 2019-02-09 MED ORDER — ENOXAPARIN SODIUM 80 MG/0.8ML ~~LOC~~ SOLN
80.0000 mg | SUBCUTANEOUS | Status: DC
  Administered 2019-02-09 – 2019-02-21 (×13): 80 mg via SUBCUTANEOUS
  Filled 2019-02-09 (×13): qty 0.8

## 2019-02-09 MED ORDER — ALUM & MAG HYDROXIDE-SIMETH 200-200-20 MG/5ML PO SUSP
30.0000 mL | Freq: Four times a day (QID) | ORAL | Status: DC | PRN
  Administered 2019-02-09: 30 mL via ORAL
  Filled 2019-02-09: qty 30

## 2019-02-09 NOTE — Progress Notes (Signed)
PROGRESS NOTE  Edwin Mcclain PHX:[id number] DOB: [birth year] DOA: 02/07/2019 PCP: Patient, No Pcp Per  Brief History   Edwin Mcclain is a 53 y.o. male with medical history significant of hypertension, morbid obesity, medication noncompliance, who presents with shortness breath and leg edema.  Patient states that he has been having shortness of breath for more than 2 weeks, which has been progressively getting worse.  Has dry cough, chills, but no fever.  No chest pain.  He also has bilateral lower leg edema.  Patient states that he has gained more than 100 pounds in the last year.  No nausea, vomiting, diarrhea, abdominal pain, symptoms of UTI or unilateral weakness.  Patient is not taking any blood pressure medications.  In the ED the pt was found to have BNP 199.7, WBC 6.1, electrolytes renal function okay, temperature 99.2, heart rate 80s, oxygen saturation 72% on room air, 92% on the 42 nasal cannula oxygen.  ABG (pH 7.238, CO2 87, PO2 79).  BiPAP was started. Patient is admitted to stepdown as inpatient.  He is receiving diuresis, supplemental O2. He is being monitored on telemetry. He is on a fluid restriction. He is on the heart failure clinical pathway. His volume status and electrolytes are being carefully monitored.  A & P   Assessment/Plan Principal Problem:   Acute CHF (congestive heart failure) (HCC) Active Problems:   HTN (hypertension)   Acute respiratory failure with hypoxia (HCC)   Morbid obesity (HCC)  Acute respiratory failure with hypoxia due to acute CHF (congestive heart failure) (HCC): Blood pressures are much improved. The patient has not been compliant with his medications at home. He has a negative fluid balance. BIPAP has been removed and he is currently saturating 97% on 1L by nasal cannula. 2D echocardiogram demonstrates an EF of 60 - 65% with normal diastolic function of the left ventricle. He has been started on lisinopril 5 mg daily.   HTN: Improved with  diuresis, lisinopril, and as needed hydralazine.  Morbid obesity: Complicates all cares. Recommend sensible weight loss with diet and exercise as managed by the patient's PCP.  DVT prophylaxis: Lovenox Code Status: Full Code Family Communication: No emergency contact listed. No family at bedside. Disposition Plan: Home  Pa Tennant, DO [facility] Direct contact: see [email or web address]  7PM-7AM contact night coverage as above 02/09/2019, 3:30 PM  LOS: 1 day   Interval History/Subjective  The patient is awake, alert, and oriented x 3. No acute distress. He is feeling better.  Objective   Vitals:  Vitals:   02/09/19 1033 02/09/19 1141  BP: 120/61 127/74  Pulse:  83  Resp:  (!) 22  Temp:  99.4 F (37.4 C)  SpO2:  97%    Exam:  Constitutional:  . The patient is awake, alert, and oriented x 3. No acute distress. Respiratory:  . No wheezes, rales, or rhonchi. . No increased work of breathing. No wheezes, rales, or rhonchi.  Cardiovascular:  . Regular rate and rhythm. No murmurs, ectopy, or gallups. . 2-3+ pitting edema of lower extremities bilaterally. . Normal pedal pulses Abdomen:  . Abdomen is soft, non-tender, non-distended.  . Normoactive bowel sounds. . No hernias, masses, or organomegaly. Musculoskeletal:  . No cyanosis or clubbing. 2-3+ pitting edema of lower extremities bilaterally. Skin:  . No rashes, lesions, ulcers . palpation of skin: no induration or nodules Neurologic:  . CN 2-12 intact . Patient is moving all extremities. Psychiatric:  . Mental status o Mood, affect appropriate o Orientation to person,  place, time  . judgment and insight appear intact   I have personally reviewed the following:   Today's Data  . Vitals, CBC, Chemistry, I's and O's, and echocardiogram.   Scheduled Meds: . aspirin EC  81 mg Oral Daily  . enoxaparin (LOVENOX) injection  80 mg Subcutaneous Q24H  . furosemide  40 mg Intravenous Daily  . lisinopril  5 mg Oral  Daily  . sodium chloride flush  3 mL Intravenous Q12H   Continuous Infusions: . sodium chloride      Principal Problem:   Acute CHF (congestive heart failure) (HCC) Active Problems:   HTN (hypertension)   Acute respiratory failure with hypoxia (HCC)   Morbid obesity (HCC)   LOS: 1 day

## 2019-02-09 NOTE — Evaluation (Signed)
Occupational Therapy Evaluation Patient Details Name: Edwin Mcclain MRN: [id number] DOB: Apr 10, 1966 Today's Date: 02/09/2019    History of Present Illness Patient is 53 y/o male presenting to hospital with SOB and bilateral leg edema. Patient with hypoxic respiratory failure secondary to acute CHF. PMH includes HTN and morbid obesity    Clinical Impression   Pt PTA: living alone and independent with ADL and mobility prior. Pt currently wearing 2L O2 and desatting to 89% with exertion, but quickly recovers. Pt currently ambulating with no AD supervisionA for wires to modified independence. Pt sitting EOB for LB ADL and standing at sink for grooming. Pt with no weakness or LOB episodes noted. Pt does not require continued OT skilled services. OT signing off. Thank you.  O2 89%-96% on 2L O2; HR 90s upon exertion.     Follow Up Recommendations  No OT follow up;Supervision - Intermittent    Equipment Recommendations       Recommendations for Other Services       Precautions / Restrictions Precautions Precautions: Fall Restrictions Weight Bearing Restrictions: No      Mobility Bed Mobility Overal bed mobility: Modified Independent Bed Mobility: Supine to Sit     Supine to sit: Supervision     General bed mobility comments: rail for mobility  Transfers Overall transfer level: Modified independent Equipment used: None Transfers: Sit to/from Stand Sit to Stand: Supervision         General transfer comment: Modified independent    Balance Overall balance assessment: Needs assistance Sitting-balance support: Feet supported;No upper extremity supported Sitting balance-Leahy Scale: Good     Standing balance support: No upper extremity supported Standing balance-Leahy Scale: Good Standing balance comment: No LOB during gait tasks                 Standardized Balance Assessment Standardized Balance Assessment : Dynamic Gait Index   Dynamic Gait Index Level  Surface: Normal Gait with Horizontal Head Turns: Normal Gait with Vertical Head Turns: Normal Step Over Obstacle: Normal     ADL either performed or assessed with clinical judgement   ADL Overall ADL's : At baseline                                       General ADL Comments: Pt requires increased time for tasks, but able to perform UB and LB ADL with set-upA     Vision Baseline Vision/History: No visual deficits       Perception     Praxis      Pertinent Vitals/Pain Pain Assessment: 0-10 Pain Score: 2  Pain Location: discomfort in belly     Hand Dominance     Extremity/Trunk Assessment Upper Extremity Assessment Upper Extremity Assessment: Overall WFL for tasks assessed   Lower Extremity Assessment Lower Extremity Assessment: Generalized weakness   Cervical / Trunk Assessment Cervical / Trunk Assessment: Normal   Communication Communication Communication: No difficulties   Cognition Arousal/Alertness: Awake/alert Behavior During Therapy: WFL for tasks assessed/performed Overall Cognitive Status: Within Functional Limits for tasks assessed                                     General Comments  O2 levels desat to 89% on 2L O2 after activity. pt quickly recovers >90% within 10 sesc of rest.    Exercises  Shoulder Instructions      Home Living Family/patient expects to be discharged to:: Private residence Living Arrangements: Other relatives Available Help at Discharge: Friend(s);Available 24 hours/day Type of Home: House Home Access: Stairs to enter [employer] of Steps: 5 Entrance Stairs-Rails: Left Home Layout: One level     Bathroom Shower/Tub: Chief Strategy Officer: Standard     Home Equipment: None          Prior Functioning/Environment Level of Independence: Independent                 OT Problem List:        OT Treatment/Interventions:      OT Goals(Current goals  can be found in the care plan section) Acute Rehab OT Goals Patient Stated Goal: go home  OT Frequency:     Barriers to D/C:            Co-evaluation              AM-PAC OT "6 Clicks" Daily Activity     Outcome Measure Help from another person eating meals?: None Help from another person taking care of personal grooming?: None Help from another person toileting, which includes using toliet, bedpan, or urinal?: None Help from another person bathing (including washing, rinsing, drying)?: None Help from another person to put on and taking off regular upper body clothing?: None Help from another person to put on and taking off regular lower body clothing?: None 6 Click Score: 24   End of Session Nurse Communication: Mobility status  Activity Tolerance: Patient tolerated treatment well Patient left: in bed;with call bell/phone within reach  OT Visit Diagnosis: Other abnormalities of gait and mobility (R26.89)                Time: 1510-1540 OT Time Calculation (min): 30 min Charges:  OT General Charges $OT Visit: 1 Visit OT Evaluation $OT Eval Moderate Complexity: 1 Mod OT Treatments $Self Care/Home Management : 8-22 mins  Edwin Mcclain) Glendell Docker OTR/L Acute Rehabilitation Services Pager: [phone] Office: [phone]  Sandrea Hughs 02/09/2019, 4:07 PM

## 2019-02-09 NOTE — Progress Notes (Signed)
PROGRESS NOTE  Edwin Mcclain SVX:[id number] DOB: [birth year] DOA: 02/07/2019 PCP: Patient, No Pcp Per  Brief History   Edwin Mcclain is a 53 y.o. male with medical history significant of hypertension, morbid obesity, medication noncompliance, who presents with shortness breath and leg edema.  Patient states that he has been having shortness of breath for more than 2 weeks, which has been progressively getting worse.  Has dry cough, chills, but no fever.  No chest pain.  He also has bilateral lower leg edema.  Patient states that he has gained more than 100 pounds in the last year.  No nausea, vomiting, diarrhea, abdominal pain, symptoms of UTI or unilateral weakness.  Patient is not taking any blood pressure medications.  In the ED the pt was found to have BNP 199.7, WBC 6.1, electrolytes renal function okay, temperature 99.2, heart rate 80s, oxygen saturation 72% on room air, 92% on the 42 nasal cannula oxygen.  ABG (pH 7.238, CO2 87, PO2 79).  BiPAP was started. Patient is admitted to stepdown as inpatient.  He is receiving diuresis, supplemental O2. He is being monitored on telemetry. He is on a fluid restriction. He is on the heart failure clinical pathway. His volume status and electrolytes are being carefully monitored.  A & P   Assessment/Plan Principal Problem:   Acute CHF (congestive heart failure) (HCC) Active Problems:   HTN (hypertension)   Acute respiratory failure with hypoxia (HCC)   Morbid obesity (HCC)  Acute respiratory failure with hypoxia due to acute CHF (congestive heart failure) (HCC): Blood pressures are much improved. The patient has not been compliant with his medications at home. He has a negative fluid balance. He is currently saturating in th elow to mid 90's on BIPAP 2D echocardiogram demonstrates an EF of 60 - 65% with normal diastolic function of the left ventricle. He has been started on lisinopril 5 mg daily.   HTN: Improved with diuresis, lisinopril, and  as needed hydralazine.  Morbid obesity: Complicates all cares. Recommend sensible weight loss with diet and exercise as managed by the patient's PCP.   DVT prophylaxis: Lovenox Code Status: Full Code Family Communication: No emergency contact listed. No family at bedside. Disposition Plan: Home   Edwin Doria, DO [facility] Direct contact: see [email or web address]  7PM-7AM contact night coverage as above 02/09/2019, 3:14 PM  LOS: 1 day   Interval History/Subjective  The patient is awake, alert, and oriented x 3. No acute distress.  Objective   Vitals:  Vitals:   02/09/19 1033 02/09/19 1141  BP: 120/61 127/74  Pulse:  83  Resp:  (!) 22  Temp:  99.4 F (37.4 C)  SpO2:  97%    Exam:  Constitutional:  . The patient is awake, alert, and oriented x 3. No acute distress. Respiratory:  . No wheezes, rales, or rhonchi. . No increased work of breathing. No wheezes, rales, or rhonchi.  Cardiovascular:  . Regular rate and rhythm. No murmurs, ectopy, or gallups. . 3+ pitting edema of lower extremities bilaterally. . Normal pedal pulses Abdomen:  . Abdomen is soft, non-tender, non-distended.  . Normoactive bowel sounds. . No hernias, masses, or organomegaly. Musculoskeletal:  . No cyanosis or clubbing. 2-3+ pitting edema of lower extremities bilaterally. Skin:  . No rashes, lesions, ulcers . palpation of skin: no induration or nodules Neurologic:  . CN 2-12 intact . Patient is moving all extremities. Psychiatric:  . Mental status o Mood, affect appropriate o Orientation to person, place, time  . judgment  and insight appear intact   I have personally reviewed the following:   Today's Data  . Vitals, CBC, Chemistry, I's and O's, and echocardiogram.   Scheduled Meds: . aspirin EC  81 mg Oral Daily  . enoxaparin (LOVENOX) injection  80 mg Subcutaneous Q24H  . furosemide  40 mg Intravenous Daily  . lisinopril  5 mg Oral Daily  . sodium chloride flush  3 mL  Intravenous Q12H   Continuous Infusions: . sodium chloride      Principal Problem:   Acute CHF (congestive heart failure) (HCC) Active Problems:   HTN (hypertension)   Acute respiratory failure with hypoxia (HCC)   Morbid obesity (HCC)   LOS: 1 day

## 2019-02-09 NOTE — Progress Notes (Signed)
Physical Therapy Evaluation Patient Details Name: Edwin Mcclain MRN: [id number] DOB: 1966/02/24 Today's Date: 02/09/2019   History of Present Illness  Patient is 53 y/o male presenting to hospital with SOB and bilateral leg edema. Patient with hypoxic respiratory failure secondary to acute CHF. PMH includes HTN and morbid obesity   Clinical Impression  Patient admitted to hospital secondary to problems above and with deficits below. Patient required supervision to min guard to ambulate without AD. Patient ambulates with decreased gait speed however reports this is his baseline. Patient will benefit from acute physical therapy to maximize independence and safety with functional mobility.     Follow Up Recommendations No PT follow up    Equipment Recommendations  None recommended by PT    Recommendations for Other Services       Precautions / Restrictions Precautions Precautions: Fall Restrictions Weight Bearing Restrictions: No      Mobility  Bed Mobility Overal bed mobility: Needs Assistance Bed Mobility: Supine to Sit     Supine to sit: Supervision     General bed mobility comments: Patient required supervision for bed mobility for safety  Transfers Overall transfer level: Needs assistance Equipment used: None Transfers: Sit to/from Stand Sit to Stand: Supervision         General transfer comment: Patient required supervision to stand for safety. Verbal cues to push up from bed when standing.   Ambulation/Gait Ambulation/Gait assistance: Min guard;Supervision Gait Distance (Feet): 150 Feet Assistive device: None Gait Pattern/deviations: Step-through pattern;Decreased step length - right;Decreased step length - left;Decreased stride length Gait velocity: decreased Gait velocity interpretation: 1.31 - 2.62 ft/sec, indicative of limited community ambulator General Gait Details: Patient ambulated with supervision to min guard for safety. Patient ambulating with  decreased gait speed but states he always takes his time. Oxygen decreased to 84% on 1L briefly however unsure of accuracy due to oxygen quickly returned to [facility] with standing rest.   Stairs            Wheelchair Mobility    Modified Rankin (Stroke Patients Only)       Balance Overall balance assessment: Needs assistance Sitting-balance support: Feet supported;No upper extremity supported Sitting balance-Leahy Scale: Good     Standing balance support: No upper extremity supported Standing balance-Leahy Scale: Good Standing balance comment: No LOB during gait tasks                 Standardized Balance Assessment Standardized Balance Assessment : Dynamic Gait Index   Dynamic Gait Index Level Surface: Normal Gait with Horizontal Head Turns: Normal Gait with Vertical Head Turns: Normal Step Over Obstacle: Normal       Pertinent Vitals/Pain Pain Assessment: No/denies pain    Home Living Family/patient expects to be discharged to:: Private residence Living Arrangements: Other relatives(cousin) Available Help at Discharge: Friend(s);Available 24 hours/day Type of Home: House Home Access: Stairs to enter Entrance Stairs-Rails: Left Entrance Stairs-Number of Steps: 5 Home Layout: One level Home Equipment: None      Prior Function Level of Independence: Independent               Hand Dominance        Extremity/Trunk Assessment   Upper Extremity Assessment Upper Extremity Assessment: Overall WFL for tasks assessed    Lower Extremity Assessment Lower Extremity Assessment: Generalized weakness    Cervical / Trunk Assessment Cervical / Trunk Assessment: Normal  Communication   Communication: No difficulties  Cognition Arousal/Alertness: Awake/alert Behavior During Therapy: WFL for tasks assessed/performed Overall  Cognitive Status: Within Functional Limits for tasks assessed                                        General  Comments      Exercises     Assessment/Plan    PT Assessment Patient needs continued PT services  PT Problem List Decreased strength;Decreased activity tolerance;Decreased balance;Decreased mobility;Cardiopulmonary status limiting activity       PT Treatment Interventions Gait training;Stair training;Functional mobility training;Therapeutic activities;Therapeutic exercise;Balance training;Patient/family education    PT Goals (Current goals can be found in the Care Plan section)  Acute Rehab PT Goals Patient Stated Goal: go home PT Goal Formulation: With patient Time For Goal Achievement: 02/23/19 Potential to Achieve Goals: Good    Frequency Min 3X/week   Barriers to discharge        Co-evaluation               AM-PAC PT "6 Clicks" Mobility  Outcome Measure Help needed turning from your back to your side while in a flat bed without using bedrails?: None Help needed moving from lying on your back to sitting on the side of a flat bed without using bedrails?: None Help needed moving to and from a bed to a chair (including a wheelchair)?: None Help needed standing up from a chair using your arms (e.g., wheelchair or bedside chair)?: None Help needed to walk in hospital room?: A Little Help needed climbing 3-5 steps with a railing? : A Little 6 Click Score: 22    End of Session Equipment Utilized During Treatment: Gait belt;Oxygen(1L) Activity Tolerance: Patient tolerated treatment well Patient left: in chair;with call bell/phone within reach;with chair alarm set Nurse Communication: Mobility status PT Visit Diagnosis: Other abnormalities of gait and mobility (R26.89);Muscle weakness (generalized) (M62.81)    Time: 2248-2500 PT Time Calculation (min) (ACUTE ONLY): 25 min   Charges:   PT Evaluation $PT Eval Low Complexity: 1 Low PT Treatments $Gait Training: 8-22 mins        Vanessa Ralphs, SPT  Vanessa Ralphs 02/09/2019, 3:04 PM

## 2019-02-09 NOTE — Progress Notes (Signed)
RT NOTE: RT removed patient from bipap and placed on 4L [place]. Patient is alert and oriented to time and place. BBS are clear and diminished. Vitals are stable. RT will continue to monitor.

## 2019-02-10 LAB — CBC WITH DIFFERENTIAL/PLATELET
Abs Immature Granulocytes: 0.02 10*3/uL (ref 0.00–0.07)
Basophils Absolute: 0 10*3/uL (ref 0.0–0.1)
Basophils Relative: 0 %
Eosinophils Absolute: 0.1 10*3/uL (ref 0.0–0.5)
Eosinophils Relative: 1 %
HCT: 44.7 % (ref 39.0–52.0)
Hemoglobin: 12.5 g/dL — ABNORMAL LOW (ref 13.0–17.0)
Immature Granulocytes: 0 %
LYMPHS PCT: 21 %
Lymphs Abs: 1.2 10*3/uL (ref 0.7–4.0)
MCH: 20.1 pg — ABNORMAL LOW (ref 26.0–34.0)
MCHC: 28 g/dL — ABNORMAL LOW (ref 30.0–36.0)
MCV: 71.7 fL — ABNORMAL LOW (ref 80.0–100.0)
MONOS PCT: 10 %
Monocytes Absolute: 0.6 10*3/uL (ref 0.1–1.0)
Neutro Abs: 3.8 10*3/uL (ref 1.7–7.7)
Neutrophils Relative %: 68 %
Platelets: 245 10*3/uL (ref 150–400)
RBC: 6.23 MIL/uL — ABNORMAL HIGH (ref 4.22–5.81)
RDW: 21.7 % — AB (ref 11.5–15.5)
WBC: 5.6 10*3/uL (ref 4.0–10.5)
nRBC: 0 % (ref 0.0–0.2)

## 2019-02-10 LAB — COMPREHENSIVE METABOLIC PANEL
ALT: 40 U/L (ref 0–44)
AST: 27 U/L (ref 15–41)
Albumin: 2.8 g/dL — ABNORMAL LOW (ref 3.5–5.0)
Alkaline Phosphatase: 72 U/L (ref 38–126)
Anion gap: 9 (ref 5–15)
BUN: 9 mg/dL (ref 6–20)
CO2: 36 mmol/L — ABNORMAL HIGH (ref 22–32)
Calcium: 8.7 mg/dL — ABNORMAL LOW (ref 8.9–10.3)
Chloride: 95 mmol/L — ABNORMAL LOW (ref 98–111)
Creatinine, Ser: 0.99 mg/dL (ref 0.61–1.24)
GFR calc non Af Amer: >60 mL/min (ref >60)
Glucose, Bld: 100 mg/dL — ABNORMAL HIGH (ref 70–99)
POTASSIUM: 3.4 mmol/L — AB (ref 3.5–5.1)
Sodium: 140 mmol/L (ref 135–145)
Total Bilirubin: 0.9 mg/dL (ref 0.3–1.2)
Total Protein: 7 g/dL (ref 6.5–8.1)

## 2019-02-10 MED ORDER — POTASSIUM CHLORIDE CRYS ER 20 MEQ PO TBCR
20.0000 meq | EXTENDED_RELEASE_TABLET | Freq: Once | ORAL | Status: AC
  Administered 2019-02-10: 20 meq via ORAL
  Filled 2019-02-10: qty 1

## 2019-02-10 NOTE — Progress Notes (Signed)
PROGRESS NOTE  Edwin Mcclain LTR:[id number] DOB: [birth year] DOA: 02/07/2019 PCP: Patient, No Pcp Per  Brief History   Edwin Mcclain is a 53 y.o. male with medical history significant of hypertension, morbid obesity, medication noncompliance, who presents with shortness breath and leg edema.  Patient states that he has been having shortness of breath for more than 2 weeks, which has been progressively getting worse.  Has dry cough, chills, but no fever.  No chest pain.  He also has bilateral lower leg edema.  Patient states that he has gained more than 100 pounds in the last year.  No nausea, vomiting, diarrhea, abdominal pain, symptoms of UTI or unilateral weakness.  Patient is not taking any blood pressure medications.  In the ED the pt was found to have BNP 199.7, WBC 6.1, electrolytes renal function okay, temperature 99.2, heart rate 80s, oxygen saturation 72% on room air, 92% on the 42 nasal cannula oxygen.  ABG (pH 7.238, CO2 87, PO2 79).  BiPAP was started. Patient is admitted to stepdown as inpatient.  He is receiving diuresis, supplemental O2. He is being monitored on telemetry. He is on a fluid restriction. He is on the heart failure clinical pathway. His volume status and electrolytes are being carefully monitored.  A & P   Assessment/Plan Principal Problem:   Acute CHF (congestive heart failure) (HCC) Active Problems:   HTN (hypertension)   Acute respiratory failure with hypoxia (HCC)   Morbid obesity (HCC)  Acute respiratory failure with hypoxia due to acute CHF (congestive heart failure) (HCC): Blood pressures are much improved. The patient has not been compliant with his medications at home. He has a negative fluid balance. BIPAP has been removed and he is currently saturating 97% on 1L by nasal cannula. 2D echocardiogram demonstrates an EF of 60 - 65% with normal diastolic function of the left ventricle. He has been started on lisinopril 5 mg daily.   HTN: Improved with  diuresis, lisinopril, and as needed hydralazine.  Morbid obesity: Complicates all cares. Recommend sensible weight loss with diet and exercise as managed by the patient's PCP.  DVT prophylaxis: Lovenox Code Status: Full Code Family Communication: No emergency contact listed. No family at bedside. Disposition Plan: Home  Edwin Stalling, DO [facility] Direct contact: see [email or web address]  7PM-7AM contact night coverage as above 02/10/2019, 4:13 PM  LOS: 2 days   Interval History/Subjective  The patient is awake, alert, and oriented x 3. No acute distress. He is feeling better.  Objective   Vitals:  Vitals:   02/10/19 0751 02/10/19 1547  BP: 110/71 117/64  Pulse: 87 84  Resp: 16 (!) 21  Temp: 98.1 F (36.7 C) 98.8 F (37.1 C)  SpO2: 97% 94%    Exam:  Constitutional:  . The patient is awake, alert, and oriented x 3. No acute distress. Respiratory:  . No wheezes, rales, or rhonchi. . No increased work of breathing. No wheezes, rales, or rhonchi.  Cardiovascular:  . Regular rate and rhythm. No murmurs, ectopy, or gallups. . 2-3+ pitting edema of lower extremities bilaterally. . Normal pedal pulses Abdomen:  . Abdomen is soft, non-tender, non-distended.  . Normoactive bowel sounds. . No hernias, masses, or organomegaly. Musculoskeletal:  . No cyanosis or clubbing. 2-3+ pitting edema of lower extremities bilaterally. Skin:  . No rashes, lesions, ulcers . palpation of skin: no induration or nodules Neurologic:  . CN 2-12 intact . Patient is moving all extremities. Psychiatric:  . Mental status o Mood, affect appropriate o  Orientation to person, place, time  . judgment and insight appear intact   I have personally reviewed the following:   Today's Data  . Vitals, CBC, Chemistry, I's and O's, and echocardiogram.   Scheduled Meds: . aspirin EC  81 mg Oral Daily  . enoxaparin (LOVENOX) injection  80 mg Subcutaneous Q24H  . furosemide  40 mg Intravenous Daily    . lisinopril  5 mg Oral Daily  . sodium chloride flush  3 mL Intravenous Q12H   Continuous Infusions: . sodium chloride      Principal Problem:   Acute CHF (congestive heart failure) (HCC) Active Problems:   HTN (hypertension)   Acute respiratory failure with hypoxia (HCC)   Morbid obesity (HCC)   LOS: 2 days

## 2019-02-10 NOTE — Progress Notes (Signed)
Called by RN due to patient dropping sats into the 70's due to periods of apnea. Placed patient on BiPAP at this time. Vitals stable.

## 2019-02-10 NOTE — Progress Notes (Signed)
Physical Therapy Treatment Patient Details Name: Edwin Mcclain MRN: [id number] DOB: 06-13-1966 Today's Date: 02/10/2019    History of Present Illness Patient is 53 y/o male presenting to hospital with SOB and bilateral leg edema. Patient with hypoxic respiratory failure secondary to acute CHF. PMH includes HTN and morbid obesity     PT Comments    Pt was able to walk with PT on stairs with 2L today and able to control his balance.  Follow up with acute therapy and will progress his mobility as tolerated.  Stairs went well, with O2 sats not below 93% on 2L.  Follow acutely for progression of stretches and strengthening, balance and endurance as able.  Follow Up Recommendations  No PT follow up     Equipment Recommendations  None recommended by PT    Recommendations for Other Services       Precautions / Restrictions Precautions Precautions: Fall Restrictions Weight Bearing Restrictions: No    Mobility  Bed Mobility Overal bed mobility: Modified Independent Bed Mobility: Supine to Sit;Sit to Supine              Transfers Overall transfer level: Modified independent Equipment used: None Transfers: Sit to/from Stand           General transfer comment: remembers to use hand placement on the bed  Ambulation/Gait Ambulation/Gait assistance: Min guard Gait Distance (Feet): 150 Feet Assistive device: None Gait Pattern/deviations: Step-through pattern;Decreased step length - right;Decreased step length - left;Decreased stride length Gait velocity: decreased Gait velocity interpretation: <1.31 ft/sec, indicative of household ambulator General Gait Details: widens base to manage steps   Stairs Stairs: Yes Stairs assistance: Min guard Stair Management: One rail Left Number of Stairs: 4 General stair comments: step through pattern up and step to down   Wheelchair Mobility    Modified Rankin (Stroke Patients Only)       Balance Overall balance assessment:  Needs assistance Sitting-balance support: Feet supported Sitting balance-Leahy Scale: Good     Standing balance support: No upper extremity supported Standing balance-Leahy Scale: Good Standing balance comment: monitored his balance on the hall with wide base and no LOB                            Cognition Arousal/Alertness: Awake/alert Behavior During Therapy: WFL for tasks assessed/performed Overall Cognitive Status: Within Functional Limits for tasks assessed                                        Exercises General Exercises - Lower Extremity Ankle Circles/Pumps: AROM;Both;5 reps Gluteal Sets: AROM;Both;5 reps Hip ABduction/ADduction: AROM;Both;10 reps    General Comments General comments (skin integrity, edema, etc.): O2 was 93% briefly on 2L but 97% by end of gait      Pertinent Vitals/Pain Pain Assessment: No/denies pain    Home Living                      Prior Function            PT Goals (current goals can now be found in the care plan section) Acute Rehab PT Goals Patient Stated Goal: get home Progress towards PT goals: Progressing toward goals    Frequency    Min 3X/week      PT Plan Current plan remains appropriate    Co-evaluation  AM-PAC PT "6 Clicks" Mobility   Outcome Measure  Help needed turning from your back to your side while in a flat bed without using bedrails?: None Help needed moving from lying on your back to sitting on the side of a flat bed without using bedrails?: None Help needed moving to and from a bed to a chair (including a wheelchair)?: None Help needed standing up from a chair using your arms (e.g., wheelchair or bedside chair)?: None Help needed to walk in hospital room?: A Little Help needed climbing 3-5 steps with a railing? : A Little 6 Click Score: 22    End of Session Equipment Utilized During Treatment: Gait belt;Oxygen(2L O2) Activity Tolerance: Patient  tolerated treatment well Patient left: in chair;with call bell/phone within reach;with chair alarm set Nurse Communication: Mobility status PT Visit Diagnosis: Other abnormalities of gait and mobility (R26.89);Muscle weakness (generalized) (M62.81)     Time: 7096-2836 PT Time Calculation (min) (ACUTE ONLY): 25 min  Charges:  $Gait Training: 8-22 mins $Therapeutic Exercise: 8-22 mins                     Ivar Drape 02/10/2019, 1:49 PM  Samul Dada, PT MS Acute Rehab Dept. Number: [facility] [id number] and [facility] [phone]

## 2019-02-10 NOTE — Progress Notes (Signed)
Patient refusing BiPAP for the night.  RT encouraged patient to at least wear for a couple of hours.  Patient still declined.  RT will continue to monitor.  Patient vitals stable.

## 2019-02-11 DIAGNOSIS — G4733 Obstructive sleep apnea (adult) (pediatric): Secondary | ICD-10-CM

## 2019-02-11 LAB — BASIC METABOLIC PANEL
Anion gap: 12 (ref 5–15)
Anion gap: 11 (ref 5–15)
BUN: 10 mg/dL (ref 6–20)
BUN: 10 mg/dL (ref 6–20)
CO2: 38 mmol/L — ABNORMAL HIGH (ref 22–32)
CO2: 37 mmol/L — ABNORMAL HIGH (ref 22–32)
CREATININE: 0.99 mg/dL (ref 0.61–1.24)
CREATININE: 0.98 mg/dL (ref 0.61–1.24)
Calcium: 8.6 mg/dL — ABNORMAL LOW (ref 8.9–10.3)
Calcium: 8.4 mg/dL — ABNORMAL LOW (ref 8.9–10.3)
Chloride: 92 mmol/L — ABNORMAL LOW (ref 98–111)
Chloride: 93 mmol/L — ABNORMAL LOW (ref 98–111)
GFR calc Af Amer: >60 mL/min (ref >60)
GFR calc Af Amer: >60 mL/min (ref >60)
GFR calc non Af Amer: >60 mL/min (ref >60)
GFR calc non Af Amer: >60 mL/min (ref >60)
Glucose, Bld: 85 mg/dL (ref 70–99)
Glucose, Bld: 93 mg/dL (ref 70–99)
Potassium: 3.9 mmol/L (ref 3.5–5.1)
Potassium: 4 mmol/L (ref 3.5–5.1)
Sodium: 142 mmol/L (ref 135–145)
Sodium: 141 mmol/L (ref 135–145)

## 2019-02-11 LAB — CBC WITH DIFFERENTIAL/PLATELET
Abs Immature Granulocytes: 0.01 10*3/uL (ref 0.00–0.07)
Basophils Absolute: 0 10*3/uL (ref 0.0–0.1)
Basophils Relative: 0 %
EOS PCT: 3 %
Eosinophils Absolute: 0.1 10*3/uL (ref 0.0–0.5)
HCT: 42.2 % (ref 39.0–52.0)
Hemoglobin: 11.8 g/dL — ABNORMAL LOW (ref 13.0–17.0)
Immature Granulocytes: 0 %
LYMPHS PCT: 33 %
Lymphs Abs: 1.2 10*3/uL (ref 0.7–4.0)
MCH: 19.8 pg — ABNORMAL LOW (ref 26.0–34.0)
MCHC: 28 g/dL — ABNORMAL LOW (ref 30.0–36.0)
MCV: 70.9 fL — ABNORMAL LOW (ref 80.0–100.0)
Monocytes Absolute: 0.4 10*3/uL (ref 0.1–1.0)
Monocytes Relative: 11 %
Neutro Abs: 2 10*3/uL (ref 1.7–7.7)
Neutrophils Relative %: 53 %
Platelets: 235 10*3/uL (ref 150–400)
RBC: 5.95 MIL/uL — ABNORMAL HIGH (ref 4.22–5.81)
RDW: 21.1 % — ABNORMAL HIGH (ref 11.5–15.5)
WBC: 3.7 10*3/uL — ABNORMAL LOW (ref 4.0–10.5)
nRBC: 0 % (ref 0.0–0.2)

## 2019-02-11 NOTE — Plan of Care (Signed)
  Problem: Health Behavior/Discharge Planning: Goal: Ability to manage health-related needs will improve Outcome: Progressing   Problem: Clinical Measurements: Goal: Ability to maintain clinical measurements within normal limits will improve Outcome: Progressing   Problem: Clinical Measurements: Goal: Respiratory complications will improve Outcome: Progressing   

## 2019-02-11 NOTE — Progress Notes (Signed)
RN tried to wean pt of oxygen, pt desat in the 49s. Currently on 2L [place]

## 2019-02-11 NOTE — Progress Notes (Signed)
PROGRESS NOTE  Edwin Mcclain FAO:[id number] DOB: 04-23-1966 DOA: 02/07/2019 PCP: Patient, No Pcp Per  Brief History   Edwin Mcclain is a 52 y.o. male with medical history significant of hypertension, morbid obesity, medication noncompliance, who presents with shortness breath and leg edema.  Patient states that he has been having shortness of breath for more than 2 weeks, which has been progressively getting worse.  Has dry cough, chills, but no fever.  No chest pain.  He also has bilateral lower leg edema.  Patient states that he has gained more than 100 pounds in the last year.  No nausea, vomiting, diarrhea, abdominal pain, symptoms of UTI or unilateral weakness.  Patient is not taking any blood pressure medications.  In the ED the pt was found to have BNP 199.7, WBC 6.1, electrolytes renal function okay, temperature 99.2, heart rate 80s, oxygen saturation 72% on room air, 92% on the 42 nasal cannula oxygen.  ABG (pH 7.238, CO2 87, PO2 79).  BiPAP was started. Patient is admitted to stepdown as inpatient.  He is receiving diuresis, supplemental O2. He is being monitored on telemetry. He is on a fluid restriction. He is on the heart failure clinical pathway. His volume status and electrolytes are being carefully monitored.  The patient is noted to be apneic with sleep. He refused BIPAP last night. I have persuaded him to use it.   A & P   Assessment/Plan Principal Problem:   Acute CHF (congestive heart failure) (HCC) Active Problems:   HTN (hypertension)   Acute respiratory failure with hypoxia (HCC)   Morbid obesity (HCC)  Acute respiratory failure with hypoxia due to acute CHF (congestive heart failure) (HCC): Blood pressures are much improved. The patient has not been compliant with his medications at home. He has a negative fluid balance. BIPAP has been removed and he is currently saturating 97% on 1L by nasal cannula. 2D echocardiogram demonstrates an EF of 60 - 65% with normal  diastolic function of the left ventricle. He has been started on lisinopril 5 mg daily.   HTN: Improved with diuresis, lisinopril, and as needed hydralazine.  Morbid obesity: Complicates all cares. Recommend sensible weight loss with diet and exercise as managed by the patient's PCP.  Obstructive Sleep Apnea: Pt needs CPAP/BIPAP for sleep. He needs a sleep eval as outpatient.  DVT prophylaxis: Lovenox Code Status: Full Code Family Communication: No emergency contact listed. No family at bedside. Disposition Plan: Home  Edwin Vanhorn, DO [facility] Direct contact: see [email or web address]  7PM-7AM contact night coverage as above 02/11/2019, 4:38 PM  LOS: 3 days   Interval History/Subjective  The patient is awake, alert, and oriented x 3. No acute distress. He is feeling better.  Objective   Vitals:  Vitals:   02/11/19 0800 02/11/19 1545  BP: 129/78 127/76  Pulse: 78 87  Resp: 14 19  Temp: (!) 97.5 F (36.4 C) 97.9 F (36.6 C)  SpO2: 98% 95%    Exam:  Constitutional:  . The patient is awake, alert, and oriented x 3. No acute distress. Respiratory:  . No wheezes, rales, or rhonchi. . No increased work of breathing. No wheezes, rales, or rhonchi.  Cardiovascular:  . Regular rate and rhythm. No murmurs, ectopy, or gallups. . 2-3+ pitting edema of lower extremities bilaterally. . Normal pedal pulses Abdomen:  . Abdomen is soft, non-tender, non-distended.  . Normoactive bowel sounds. . No hernias, masses, or organomegaly. Musculoskeletal:  . No cyanosis or clubbing. 2-3+ pitting edema of lower  extremities bilaterally. Skin:  . No rashes, lesions, ulcers . palpation of skin: no induration or nodules Neurologic:  . CN 2-12 intact . Patient is moving all extremities. Psychiatric:  . Mental status o Mood, affect appropriate o Orientation to person, place, time  . judgment and insight appear intact   I have personally reviewed the following:   Today's Data   . Vitals, CBC, Chemistry, I's and O's, and echocardiogram.   Scheduled Meds: . aspirin EC  81 mg Oral Daily  . enoxaparin (LOVENOX) injection  80 mg Subcutaneous Q24H  . furosemide  40 mg Intravenous Daily  . lisinopril  5 mg Oral Daily  . sodium chloride flush  3 mL Intravenous Q12H   Continuous Infusions: . sodium chloride      Principal Problem:   Acute CHF (congestive heart failure) (HCC) Active Problems:   HTN (hypertension)   Acute respiratory failure with hypoxia (HCC)   Morbid obesity (HCC) OSA  LOS: 3 days

## 2019-02-11 NOTE — Progress Notes (Signed)
RN called RT and stated that patient had changed his mind about wearing BiPAP.  RT placed patient on BiPAP.  Patient stable resting well.

## 2019-02-12 LAB — BASIC METABOLIC PANEL
Anion gap: 12 (ref 5–15)
BUN: 13 mg/dL (ref 6–20)
CHLORIDE: 93 mmol/L — AB (ref 98–111)
CO2: 33 mmol/L — ABNORMAL HIGH (ref 22–32)
Calcium: 8.7 mg/dL — ABNORMAL LOW (ref 8.9–10.3)
Creatinine, Ser: 1.04 mg/dL (ref 0.61–1.24)
GFR calc Af Amer: >60 mL/min (ref >60)
GFR calc non Af Amer: >60 mL/min (ref >60)
Glucose, Bld: 89 mg/dL (ref 70–99)
Potassium: 3.6 mmol/L (ref 3.5–5.1)
Sodium: 138 mmol/L (ref 135–145)

## 2019-02-12 MED ORDER — FUROSEMIDE 10 MG/ML IJ SOLN
60.0000 mg | Freq: Two times a day (BID) | INTRAMUSCULAR | Status: DC
  Administered 2019-02-12 – 2019-02-15 (×6): 60 mg via INTRAVENOUS
  Filled 2019-02-12 (×6): qty 6

## 2019-02-12 NOTE — Progress Notes (Signed)
PROGRESS NOTE  Varick Zoto MKL:[id number] DOB: 1966/06/24 DOA: 02/07/2019 PCP: Patient, No Pcp Per  Brief History   Hermon Fierstein is a 53 y.o. male with medical history significant of hypertension, morbid obesity, medication noncompliance, who presents with shortness breath and leg edema.  Patient states that he has been having shortness of breath for more than 2 weeks, which has been progressively getting worse.  Has dry cough, chills, but no fever.  No chest pain.  He also has bilateral lower leg edema.  Patient states that he has gained more than 100 pounds in the last year.  No nausea, vomiting, diarrhea, abdominal pain, symptoms of UTI or unilateral weakness.  Patient is not taking any blood pressure medications.  In the ED the pt was found to have BNP 199.7, WBC 6.1, electrolytes renal function okay, temperature 99.2, heart rate 80s, oxygen saturation 72% on room air, 92% on the 42 nasal cannula oxygen.  ABG (pH 7.238, CO2 87, PO2 79).  BiPAP was started. Patient is admitted to stepdown as inpatient.  He is receiving diuresis, supplemental O2. He is being monitored on telemetry. He is on a fluid restriction. He is on the heart failure clinical pathway. His volume status and electrolytes are being carefully monitored.  The patient is noted to be apneic with sleep. He refused BIPAP last night. I have persuaded him to use it. He will need to have a sleep study on discharge.  A & P   Assessment/Plan Principal Problem:   Acute CHF (congestive heart failure) (HCC) Active Problems:   HTN (hypertension)   Acute respiratory failure with hypoxia (HCC)   Morbid obesity (HCC)  Acute respiratory failure with hypoxia due to acute CHF (congestive heart failure) (HCC): Blood pressures are much improved. The patient has not been compliant with his medications at home. He has a negative fluid balance. BIPAP has been removed and he is currently saturating 97% on 1L by nasal cannula. 2D echocardiogram  demonstrates an EF of 60 - 65% with normal diastolic function of the left ventricle. He has been started on lisinopril 5 mg daily. I have increased his lasix to 60 mg bid.  HTN: Improved with diuresis, lisinopril, and as needed hydralazine.  Morbid obesity: Complicates all cares. Recommend sensible weight loss with diet and exercise as managed by the patient's PCP.  Obstructive Sleep Apnea: Pt needs CPAP/BIPAP for sleep. He needs a sleep eval as outpatient.  DVT prophylaxis: Lovenox Code Status: Full Code Family Communication: No emergency contact listed. No family at bedside. Disposition Plan: Home  Cheyanna Strick, DO [facility] Direct contact: see [email or web address]  7PM-7AM contact night coverage as above 02/12/2019, 3:42 PM  LOS: 4 days   Interval History/Subjective  The patient is awake, alert, and oriented x 3. No acute distress. He is feeling better.  Objective   Vitals:  Vitals:   02/12/19 0814 02/12/19 0854  BP: 115/78 109/71  Pulse: 77   Resp: 16   Temp: 98.2 F (36.8 C)   SpO2: 100%     Exam:  Constitutional:  . The patient is awake, alert, and oriented x 3. No acute distress. Respiratory:  . No wheezes, rales, or rhonchi. . No increased work of breathing. No wheezes, rales, or rhonchi.  Cardiovascular:  . Regular rate and rhythm. No murmurs, ectopy, or gallups. . 2-3+ pitting edema of lower extremities bilaterally. . Normal pedal pulses Abdomen:  . Abdomen is soft, non-tender, non-distended.  . Normoactive bowel sounds. . No hernias, masses, or  organomegaly. Musculoskeletal:  . No cyanosis or clubbing. 2-3+ pitting edema of lower extremities bilaterally. Skin:  . No rashes, lesions, ulcers . palpation of skin: no induration or nodules Neurologic:  . CN 2-12 intact . Patient is moving all extremities. Psychiatric:  . Mental status o Mood, affect appropriate o Orientation to person, place, time  . judgment and insight appear intact   I have  personally reviewed the following:   Today's Data  . Vitals, CBC, Chemistry, I's and O's, and echocardiogram.   Scheduled Meds: . aspirin EC  81 mg Oral Daily  . enoxaparin (LOVENOX) injection  80 mg Subcutaneous Q24H  . furosemide  60 mg Intravenous BID  . lisinopril  5 mg Oral Daily  . sodium chloride flush  3 mL Intravenous Q12H   Continuous Infusions: . sodium chloride      Principal Problem:   Acute CHF (congestive heart failure) (HCC) Active Problems:   HTN (hypertension)   Acute respiratory failure with hypoxia (HCC)   Morbid obesity (HCC) OSA  LOS: 4 days

## 2019-02-13 DIAGNOSIS — J810 Acute pulmonary edema: Secondary | ICD-10-CM

## 2019-02-13 DIAGNOSIS — I272 Pulmonary hypertension, unspecified: Secondary | ICD-10-CM

## 2019-02-13 LAB — BASIC METABOLIC PANEL
Anion gap: 9 (ref 5–15)
BUN: 12 mg/dL (ref 6–20)
CO2: 36 mmol/L — ABNORMAL HIGH (ref 22–32)
Calcium: 8.7 mg/dL — ABNORMAL LOW (ref 8.9–10.3)
Chloride: 94 mmol/L — ABNORMAL LOW (ref 98–111)
Creatinine, Ser: 0.99 mg/dL (ref 0.61–1.24)
GFR calc Af Amer: >60 mL/min (ref >60)
GFR calc non Af Amer: >60 mL/min (ref >60)
Glucose, Bld: 94 mg/dL (ref 70–99)
Potassium: 3.6 mmol/L (ref 3.5–5.1)
Sodium: 139 mmol/L (ref 135–145)

## 2019-02-13 MED ORDER — SENNOSIDES-DOCUSATE SODIUM 8.6-50 MG PO TABS
1.0000 | ORAL_TABLET | Freq: Two times a day (BID) | ORAL | Status: DC
  Administered 2019-02-14 – 2019-02-21 (×8): 1 via ORAL
  Filled 2019-02-13 (×8): qty 1

## 2019-02-13 MED ORDER — INFLUENZA VAC SPLIT QUAD 0.5 ML IM SUSY
0.5000 mL | PREFILLED_SYRINGE | INTRAMUSCULAR | Status: AC
  Administered 2019-02-14: 0.5 mL via INTRAMUSCULAR
  Filled 2019-02-13: qty 0.5

## 2019-02-13 MED ORDER — ALBUTEROL SULFATE (2.5 MG/3ML) 0.083% IN NEBU: 2.5000 mg | INHALATION_SOLUTION | RESPIRATORY_TRACT | Status: DC | PRN

## 2019-02-13 MED ORDER — PNEUMOCOCCAL VAC POLYVALENT 25 MCG/0.5ML IJ INJ
0.5000 mL | INJECTION | INTRAMUSCULAR | Status: AC
  Administered 2019-02-14: 0.5 mL via INTRAMUSCULAR
  Filled 2019-02-13: qty 0.5

## 2019-02-13 NOTE — Progress Notes (Signed)
Patient placed on BiPap using the dreamstation for HS.  18/8, 3LPM bleed in.  Patient is familiar with equipment and procedure and tolerated well.  Sats 93-94%

## 2019-02-13 NOTE — Progress Notes (Signed)
[place] TEAM 1 - Stepdown/ICU TEAM  Edwin Mcclain  TUU:[id number] DOB: [birth year] DOA: 02/07/2019 PCP: Patient, No Pcp Per    Brief Narrative:  52yo w/ a hx of hypertension, morbid obesity, and medication noncompliance, who presented with shortness breath and leg edema.  In the ED he was found to have an oxygen saturation of 72% on RA.   Subjective: The patient states he is beginning to feel better.  He is getting up and out of bed some now and is less short of breath with exertion.  He denies chest pain nausea vomiting or abdominal pain.  Assessment & Plan:  Acute hypoxic respiratory failure Due to pulmonary edema - does not require home O2 at baseline - cont to diurese and wean to RA as able   Pulmonary edema - Pulmonary HTN TTE noted EF 60 - 65% with normal diastolic function of the left ventricle and no apparent RV failure - RVSP elevated at 45.2 - cont to diurese as able   [employer]   02/11/19 0438 02/12/19 0458 02/13/19 0300  Weight: (!) 172.9 kg (!) 172.6 kg (!) 173.2 kg    HTN Improved with diuresis and lisinopril - follow   Morbid obesity - Body mass index is 53.26 kg/m.  Obstructive Sleep Apnea Appears he needs CPAP/BIPAP for sleep - will have to have a sleep eval as outpatient  Microcytic anemia A worrisome finding - guaiac stools - check anemia panel  DVT prophylaxis: lovenox  Code Status: FULL CODE Family Communication: no family present at time of exam  Disposition Plan: transfer to tele bed - PT/OT - wean O2 - possible home ~48hrs  Consultants:  none  Antimicrobials:  none   Objective: Blood pressure 114/66, pulse 76, temperature 98.3 F (36.8 C), temperature source Axillary, resp. rate 13, height 5\' 11"  (1.803 m), weight (!) 173.2 kg, SpO2 96 %.  Intake/Output Summary (Last 24 hours) at 02/13/2019 1551 Last data filed at 02/13/2019 1300 Gross per 24 hour  Intake 238 ml  Output 2625 ml  Net -2387 ml   Filed Weights   02/11/19 0438  02/12/19 0458 02/13/19 0300  Weight: (!) 172.9 kg (!) 172.6 kg (!) 173.2 kg    Examination: General: No acute respiratory distress at rest on O2 Lungs: Distant breath sounds in all fields with mild bibasilar crackles Cardiovascular: Very distant heart sounds but regular rate and rhythm without appreciable murmur Abdomen: Morbidly obese, soft, bowel sounds positive, no rebound Extremities: 2+ chronic appearing bilateral lower extremity edema with change of chronic venous stasis dermatitis  CBC: Recent Labs  Lab 02/07/19 1730 02/08/19 0230 02/10/19 0652 02/11/19 0759  WBC 6.1  --  5.6 3.7*  NEUTROABS  --   --  3.8 2.0  HGB 12.0* 13.9 12.5* 11.8*  HCT 44.1 41.0 44.7 42.2  MCV 72.3*  --  71.7* 70.9*  PLT 241  --  245 235   Basic Metabolic Panel: Recent Labs  Lab 02/08/19 0502  02/11/19 0759 02/12/19 0409 02/13/19 0415  NA  --    < > 141 138 139  K  --    < > 4.0 3.6 3.6  CL  --    < > 93* 93* 94*  CO2  --    < > 37* 33* 36*  GLUCOSE  --    < > 93 89 94  BUN  --    < > 10 13 12   CREATININE  --    < > 0.98 1.04 0.99  CALCIUM  --    < > 8.4* 8.7* 8.7*  MG 2.1  --   --   --   --    < > = values in this interval not displayed.   GFR: Estimated Creatinine Clearance: 143 mL/min (by C-G formula based on SCr of 0.99 mg/dL).  Liver Function Tests: Recent Labs  Lab 02/10/19 0652  AST 27  ALT 40  ALKPHOS 72  BILITOT 0.9  PROT 7.0  ALBUMIN 2.8*    Cardiac Enzymes: Recent Labs  Lab 02/08/19 0502 02/08/19 1036 02/08/19 1830  TROPONINI <0.03 <0.03 <0.03     Scheduled Meds: . aspirin EC  81 mg Oral Daily  . enoxaparin (LOVENOX) injection  80 mg Subcutaneous Q24H  . furosemide  60 mg Intravenous BID  . lisinopril  5 mg Oral Daily  . sodium chloride flush  3 mL Intravenous Q12H     LOS: 5 days   Lonia Blood, MD [facility] Office  [phone] Pager - Text Page per Amion  If 7PM-7AM, please contact night-coverage per Amion 02/13/2019, 3:51  PM

## 2019-02-13 NOTE — Care Management Note (Addendum)
Case Management Note  Patient Details  Name: Edwin Mcclain MRN: [id number] Date of Birth: 03-Jan-1966  Subjective/Objective:  Pt presented for Acute CHF- PTA from home with cousin. Plan to return home once stable. Patient states he will not need HH Services at this time. Patient curretnly  Wearing 02- plan to wean prior to transition home. Pt currently without PCP-Agreeable to appointment to be made at Primary Care at [facility]. Unable to get anyone to answer the phone. Patient would like Rx's to be sent to [place].                 Action/Plan: CM will continue to follow for DME 02 needs. No further needs at this time.   Expected Discharge Date:                  Expected Discharge Plan:  Home/Self Care  In-House Referral:  NA  Discharge planning Services  CM Consult, Follow Up appt scheduled.   Post Acute Care Choice:  Durable Medical Equipment Choice offered to:  Patient  DME Arranged:  Oxygen DME Agency:   [facility]   HH Arranged:  NA HH Agency:  NA  Status of Service:  Completed  If discussed at [place] of Stay Meetings, dates discussed:    Additional Comments: 6283 02-21-19 Jacqlyn Krauss, RN,BSN [phone] Patient's 02 will b delivered to room from [facility]. No further needs from CM at this time.    1035 02-20-19 Jacqlyn Krauss, RN,BSN [phone] CM did send a message to MD stating that split night sleep study will need to be completed before patient can obtain BIPAP. Patient cannot afford out of pocket. CM will assist patient with 02 needs if needed once he is stable to transition home.     1028 02-15-19 Jacqlyn Krauss, RN,BSN Case Manager [phone] CM was trying to see if patient meets for Trilogy for home. ABG met qualifications, however patient does not have qualifying diagnosis for Trilogy. Patient will need a Sleep study and he may be eligible for Bipap post sleep study so insurance can assist with payment.  Leaving from hospital with BIPAP without sleep study will cost the patient $480.00 up front and patient not able to afford the cost. Patient will need to follow up with BIPAP post sleep study. CM will continue to monitor for additional transition of care needs.   3818 02-14-19 Jacqlyn Krauss, RN,BSN [phone] CM was able to make hospital follow up appointment at Primary Care at [facility]. CM will continue to monitor for oxygen needs.  Bethena Roys, RN 02/13/2019, 4:13 PM

## 2019-02-14 LAB — COMPREHENSIVE METABOLIC PANEL
ALT: 30 U/L (ref 0–44)
AST: 25 U/L (ref 15–41)
Albumin: 2.9 g/dL — ABNORMAL LOW (ref 3.5–5.0)
Alkaline Phosphatase: 66 U/L (ref 38–126)
Anion gap: 13 (ref 5–15)
BUN: 13 mg/dL (ref 6–20)
CO2: 37 mmol/L — ABNORMAL HIGH (ref 22–32)
Calcium: 8.8 mg/dL — ABNORMAL LOW (ref 8.9–10.3)
Chloride: 91 mmol/L — ABNORMAL LOW (ref 98–111)
Creatinine, Ser: 1.07 mg/dL (ref 0.61–1.24)
GFR calc non Af Amer: >60 mL/min (ref >60)
Glucose, Bld: 104 mg/dL — ABNORMAL HIGH (ref 70–99)
POTASSIUM: 3.6 mmol/L (ref 3.5–5.1)
Sodium: 141 mmol/L (ref 135–145)
TOTAL PROTEIN: 7.4 g/dL (ref 6.5–8.1)
Total Bilirubin: 0.5 mg/dL (ref 0.3–1.2)

## 2019-02-14 LAB — IRON AND TIBC
Iron: 67 ug/dL (ref 45–182)
SATURATION RATIOS: 18 % (ref 17.9–39.5)
TIBC: 379 ug/dL (ref 250–450)
UIBC: 312 ug/dL

## 2019-02-14 LAB — CBC
HCT: 44.1 % (ref 39.0–52.0)
Hemoglobin: 12.8 g/dL — ABNORMAL LOW (ref 13.0–17.0)
MCH: 20.3 pg — ABNORMAL LOW (ref 26.0–34.0)
MCHC: 29 g/dL — ABNORMAL LOW (ref 30.0–36.0)
MCV: 69.9 fL — ABNORMAL LOW (ref 80.0–100.0)
Platelets: 228 10*3/uL (ref 150–400)
RBC: 6.31 MIL/uL — ABNORMAL HIGH (ref 4.22–5.81)
RDW: 21.3 % — ABNORMAL HIGH (ref 11.5–15.5)
WBC: 3.7 10*3/uL — ABNORMAL LOW (ref 4.0–10.5)
nRBC: 0 % (ref 0.0–0.2)

## 2019-02-14 LAB — RETICULOCYTES
Immature Retic Fract: 9.1 % (ref 2.3–15.9)
RBC.: 6.31 MIL/uL — ABNORMAL HIGH (ref 4.22–5.81)
Retic Count, Absolute: 51.1 10*3/uL (ref 19.0–186.0)
Retic Ct Pct: 0.8 % (ref 0.4–3.1)

## 2019-02-14 LAB — FOLATE: Folate: 6.7 ng/mL (ref >5.9)

## 2019-02-14 LAB — FERRITIN: Ferritin: 24 ng/mL (ref 24–336)

## 2019-02-14 LAB — VITAMIN B12: VITAMIN B 12: 393 pg/mL (ref 180–914)

## 2019-02-14 NOTE — Progress Notes (Signed)
SATURATION QUALIFICATIONS: (This note is used to comply with regulatory documentation for home oxygen)  Patient Saturations on Room Air at Rest = 94%  Patient Saturations on Room Air while Ambulating = 80%  Patient Saturations on 3 Liters of oxygen while Ambulating = 93%  Please briefly explain why patient needs home oxygen: Pt required supplemental oxygen to maintain SpO2 >/88% with mobility.  Ina Homes, PT, DPT Acute Rehabilitation Services  Pager [phone] Office [phone]

## 2019-02-14 NOTE — Progress Notes (Signed)
OT Cancellation Note and Discharge  Patient Details Name: Edwin Mcclain MRN: [id number] DOB: 10/05/1966   Cancelled Treatment:    Reason Eval/Treat Not Completed: Other (comment). Pt was evaled on 02/09/2019 by OT without any further needs. In speaking with PT today pt is functioning well. We will not re-eval.   Ignacia Palma, OTR/L Acute Rehab Services Pager [phone] Office [phone]     Edwin Mcclain 02/14/2019, 2:04 PM

## 2019-02-14 NOTE — Progress Notes (Signed)
PROGRESS NOTE    Edwin Mcclain  JSE:[id number] DOB: 02-19-1966 DOA: 02/07/2019 PCP: Patient, No Pcp Per   Brief Narrative: Edwin Mcclain is a 53 y.o. male with a history of hypertension, morbid obesity, and medication noncompliance. Patient presented secondary to shortness of breath and leg edema and found to have acute pulmonary edema. He has been on IV lasix.   Assessment & Plan:   Principal Problem:   Acute CHF (congestive heart failure) (HCC) Active Problems:   HTN (hypertension)   Acute respiratory failure with hypoxia (HCC)   Morbid obesity (HCC)   Acute pulmonary edema Pulmonary hypertension Transthoracic Echocardiogram significant for an EF of 60-65% with mild RV dilation; RSVP of 45 Diastolic function normal. Weight in 2019 of 350 lbs. Currently down to 370 lbs.  -Continue IV lasix -Daily weights/strict in and out  Acute respiratory failure with hypoxia Secondary to above. Also exacerbated by OSA and likely OHS. May need Trilogy on discharge if no resolution with diuresis  Essential hypertension Normotensive. -Continue lisinopril  Morbid obesity Body mass index is 51.6 kg/m.  Microcytic anemia Hemoglobin stable  OSA -Continue CPAP/BIPAP at night -Outpatient sleep study  DVT prophylaxis: Lovenox Code Status:   Code Status: Full Code Family Communication: None at bedside Disposition Plan: Discharge once closer to possible dry weight   Consultants:   None  Procedures:   Transthoracic Echocardiogram (02/08/2019) IMPRESSIONS    1. The left ventricle has normal systolic function with an ejection fraction of 60-65%. The cavity size was normal. Left ventricular diastolic parameters were normal.  2. The right ventricle has normal systolic function. The cavity was mildly enlarged. There is no increase in right ventricular wall thickness.  3. The mitral valve is normal in structure.  4. The tricuspid valve is normal in structure.  5. The aortic valve was  not well visualized.  6. The pulmonic valve was normal in structure.  7. The interatrial septum was not well visualized.   Antimicrobials:  None    Subjective: No shortness of breath currently. No chest pain  Objective: Vitals:   02/14/19 0503 02/14/19 0816 02/14/19 0843 02/14/19 1155  BP: 121/83 122/76 122/76 128/85  Pulse: 85     Resp: 17 17    Temp: (!) 97.4 F (36.3 C) 97.9 F (36.6 C)  97.7 F (36.5 C)  TempSrc: Axillary Axillary  Oral  SpO2: 100%     Weight: (!) 167.8 kg     Height:        Intake/Output Summary (Last 24 hours) at 02/14/2019 1216 Last data filed at 02/14/2019 1103 Gross per 24 hour  Intake 10 ml  Output 2025 ml  Net -2015 ml   Filed Weights   02/12/19 0458 02/13/19 0300 02/14/19 0503  Weight: (!) 172.6 kg (!) 173.2 kg (!) 167.8 kg    Examination:  General exam: Appears calm and comfortable Respiratory system: Clear to auscultation but significantly diminished likely secondary to body habitus. Respiratory effort normal. Cardiovascular system: S1 & S2 heard, RRR. No murmurs, rubs, gallops or clicks. Gastrointestinal system: Abdomen is nondistended, soft and nontender. No organomegaly or masses felt. Normal bowel sounds heard. Central nervous system: Alert and oriented. No focal neurological deficits. Extremities: non-pitting edema vs adipose. No calf tenderness Skin: No cyanosis. No rashes Psychiatry: Judgement and insight appear normal. Mood & affect appropriate.     Data Reviewed: I have personally reviewed following labs and imaging studies  CBC: Recent Labs  Lab 02/07/19 1730 02/08/19 0230 02/10/19 0737 02/11/19 1062  02/14/19 0403  WBC 6.1  --  5.6 3.7* 3.7*  NEUTROABS  --   --  3.8 2.0  --   HGB 12.0* 13.9 12.5* 11.8* 12.8*  HCT 44.1 41.0 44.7 42.2 44.1  MCV 72.3*  --  71.7* 70.9* 69.9*  PLT 241  --  245 235 228   Basic Metabolic Panel: Recent Labs  Lab 02/08/19 0502  02/11/19 0635 02/11/19 0759 02/12/19 0409  02/13/19 0415 02/14/19 0403  NA  --    < > 142 141 138 139 141  K  --    < > 3.9 4.0 3.6 3.6 3.6  CL  --    < > 92* 93* 93* 94* 91*  CO2  --    < > 38* 37* 33* 36* 37*  GLUCOSE  --    < > 85 93 89 94 104*  BUN  --    < > 10 10 13 12 13   CREATININE  --    < > 0.99 0.98 1.04 0.99 1.07  CALCIUM  --    < > 8.6* 8.4* 8.7* 8.7* 8.8*  MG 2.1  --   --   --   --   --   --    < > = values in this interval not displayed.   GFR: Estimated Creatinine Clearance: 129.7 mL/min (by C-G formula based on SCr of 1.07 mg/dL). Liver Function Tests: Recent Labs  Lab 02/10/19 0652 02/14/19 0403  AST 27 25  ALT 40 30  ALKPHOS 72 66  BILITOT 0.9 0.5  PROT 7.0 7.4  ALBUMIN 2.8* 2.9*   No results for input(s): LIPASE, AMYLASE in the last 168 hours. No results for input(s): AMMONIA in the last 168 hours. Coagulation Profile: No results for input(s): INR, PROTIME in the last 168 hours. Cardiac Enzymes: Recent Labs  Lab 02/08/19 0502 02/08/19 1036 02/08/19 1830  TROPONINI <0.03 <0.03 <0.03   BNP (last 3 results) No results for input(s): PROBNP in the last 8760 hours. HbA1C: No results for input(s): HGBA1C in the last 72 hours. CBG: No results for input(s): GLUCAP in the last 168 hours. Lipid Profile: No results for input(s): CHOL, HDL, LDLCALC, TRIG, CHOLHDL, LDLDIRECT in the last 72 hours. Thyroid Function Tests: No results for input(s): TSH, T4TOTAL, FREET4, T3FREE, THYROIDAB in the last 72 hours. Anemia Panel: Recent Labs    02/14/19 0403  VITAMINB12 393  FOLATE 6.7  FERRITIN 24  TIBC 379  IRON 67  RETICCTPCT 0.8   Sepsis Labs: No results for input(s): PROCALCITON, LATICACIDVEN in the last 168 hours.  No results found for this or any previous visit (from the past 240 hour(s)).       Radiology Studies: No results found.      Scheduled Meds: . aspirin EC  81 mg Oral Daily  . enoxaparin (LOVENOX) injection  80 mg Subcutaneous Q24H  . furosemide  60 mg Intravenous BID   . lisinopril  5 mg Oral Daily  . senna-docusate  1 tablet Oral BID  . sodium chloride flush  3 mL Intravenous Q12H   Continuous Infusions: . sodium chloride       LOS: 6 days     Jacquelin Hawking, MD [facility] 02/14/2019, 12:16 PM  If 7PM-7AM, please contact night-coverage [email or web address]

## 2019-02-14 NOTE — Progress Notes (Signed)
Physical Therapy Treatment & Discharge Patient Details Name: Edwin Mcclain MRN: [id number] DOB: 1966/09/16 Today's Date: 02/14/2019    History of Present Illness Pt is 53 y.o. male admitted 02/07/19 with SOB and bilateral leg edema. Patient with hypoxic respiratory failure secondary to acute CHF. PMH includes HTN, morbid obesity.    PT Comments    Pt progressing well with mobility. Indep with ambulation and stairs; 2x standing rest break secondary to DOE, althoguh pt reports breathing has improved since admission. SpO2 88-100% on RA during hallway ambulation, down to 80% upon return to room, returning to 94% on 3L O2 [place] with deep breathing (RN aware). Educ on energy conservation and activity recommendations with CHF. Pt has met short-term acute PT goals. Encouraged continued ambulation during admission. Will d/c PT.   Follow Up Recommendations  No PT follow up     Equipment Recommendations  None recommended by PT    Recommendations for Other Services       Precautions / Restrictions Precautions Precautions: Other (comment) Precaution Comments: Watch SpO2 Restrictions Weight Bearing Restrictions: No    Mobility  Bed Mobility Overal bed mobility: Modified Independent Bed Mobility: Supine to Sit;Sit to Supine     Supine to sit: Modified independent (Device/Increase time);HOB elevated        Transfers Overall transfer level: Independent Equipment used: None Transfers: Sit to/from Stand              Ambulation/Gait Ambulation/Gait assistance: Modified independent (Device/Increase time) Gait Distance (Feet): 500 Feet Assistive device: None Gait Pattern/deviations: Step-through pattern;Decreased stride length;Wide base of support Gait velocity: Decreased Gait velocity interpretation: 1.31 - 2.62 ft/sec, indicative of limited community ambulator General Gait Details: Mod indep secondary to slowed gait speed. SpO2 88-100% on RA while walking, down to 80% on RA upon  return to room   Stairs Stairs: Yes Stairs assistance: Modified independent (Device/Increase time) Stair Management: One rail Left;Step to pattern;Alternating pattern;Forwards Number of Stairs: 6 General stair comments: Descended 6 steps with single UE support on rail, step-to pattern; ascended with alternating pattern.    Wheelchair Mobility    Modified Rankin (Stroke Patients Only)       Balance Overall balance assessment: Needs assistance Sitting-balance support: Feet supported Sitting balance-Leahy Scale: Good       Standing balance-Leahy Scale: Good                              Cognition Arousal/Alertness: Awake/alert Behavior During Therapy: WFL for tasks assessed/performed Overall Cognitive Status: Within Functional Limits for tasks assessed                                        Exercises      General Comments        Pertinent Vitals/Pain Pain Assessment: No/denies pain    Home Living                      Prior Function            PT Goals (current goals can now be found in the care plan section) Acute Rehab PT Goals Patient Stated Goal: get home PT Goal Formulation: With patient Time For Goal Achievement: 02/23/19 Potential to Achieve Goals: Good Progress towards PT goals: Goals met/education completed, patient discharged from PT    Frequency    Min 3X/week  PT Plan Current plan remains appropriate    Co-evaluation              AM-PAC PT "6 Clicks" Mobility   Outcome Measure  Help needed turning from your back to your side while in a flat bed without using bedrails?: None Help needed moving from lying on your back to sitting on the side of a flat bed without using bedrails?: None Help needed moving to and from a bed to a chair (including a wheelchair)?: None Help needed standing up from a chair using your arms (e.g., wheelchair or bedside chair)?: None Help needed to walk in hospital  room?: None Help needed climbing 3-5 steps with a railing? : None 6 Click Score: 24    End of Session Equipment Utilized During Treatment: Gait belt;Oxygen Activity Tolerance: Patient tolerated treatment well Patient left: in bed;with call bell/phone within reach;with nursing/sitter in room Nurse Communication: Mobility status PT Visit Diagnosis: Other abnormalities of gait and mobility (R26.89);Muscle weakness (generalized) (M62.81)     Time: 2091-9802 PT Time Calculation (min) (ACUTE ONLY): 24 min  Charges:  $Gait Training: 8-22 mins $Self Care/Home Management: [place], PT, DPT Acute Rehabilitation Services  Pager [phone] Office [place] 02/14/2019, 1:35 PM

## 2019-02-14 NOTE — Progress Notes (Signed)
Pt noted to desat as low as 57% on dreamstation with O2 at  4L with sleep.  Once awakened, pt sat returns to 100%.  Pt denies any c/o.  Periods of apnea observed with sleep.  Pt lungs to auscultation:  bibasilar crackles, otherwise clear, diminished.  RT notified to exchange for bipap.  Will continue to monitor pt closely.

## 2019-02-15 MED ORDER — FUROSEMIDE 10 MG/ML IJ SOLN
80.0000 mg | Freq: Two times a day (BID) | INTRAMUSCULAR | Status: DC
  Administered 2019-02-15: 80 mg via INTRAVENOUS
  Administered 2019-02-16: 40 mg via INTRAVENOUS
  Administered 2019-02-16: 80 mg via INTRAVENOUS
  Filled 2019-02-15 (×2): qty 8

## 2019-02-15 MED ORDER — METOLAZONE 5 MG PO TABS
2.5000 mg | ORAL_TABLET | Freq: Once | ORAL | Status: AC
  Administered 2019-02-16: 2.5 mg via ORAL
  Filled 2019-02-15: qty 0.5
  Filled 2019-02-15: qty 1

## 2019-02-15 NOTE — Plan of Care (Signed)
  Problem: Education: Goal: Knowledge of General Education information will improve Description Including pain rating scale, medication(s)/side effects and non-pharmacologic comfort measures Outcome: Progressing   Problem: Health Behavior/Discharge Planning: Goal: Ability to manage health-related needs will improve Outcome: Progressing   

## 2019-02-15 NOTE — Progress Notes (Signed)
[facility] PROGRESS NOTE  Edwin Mcclain [id number] DOB: 1966-11-25 DOA: 02/07/2019 PCP: Patient, No Pcp Per   Narrative: 53 year old male Known history HTN not on meds prior to admission BMI 51 Endorses 2-week history of shortness of breath without chest pain bilateral edema and has gained >100 pounds in the past year Came to emergency room 2/18 and returned 2/19 with worsening shortness of breath and pulmonary edema    A & Plan Acute pulmonary edema, pulmonary hypertension Diuresing with Lasix 60 twice daily increased top 80 bid IV--might add metolazone in am as has pr9obable gut edema Will need diuresis until dry weight up about 350 pounds achieved Biggest risk factors blood pressure See below  Uncontrolled hypertension, diastolic heart failure Both related Continue ACE for cardiac remodeling prevention, Adding in addition low-dose BiDil Eventually will add low-dose metoprolol probably around time of discharge Blood pressure is moderately controlled  OHSS/OSA Using CPAP here CM to set-up DME  patient will probably need a sleep study  BMI >50 Super morbid obesity with comorbidities-likely would benefit from counseling for GOP procedure versus other restrictive surgery He has joined a fitness program at work at [employer]  Anemia-mild-monitor Significantly microcytic so may have a component of iron deficiency Will need Hemoccult and screening as an outpatient if not done previously  DVT lovenox  Code Status:  full  Communication:  No family  Disposition Plan: inpatient    Edwin Magnussen, MD  [facility] Via [employer] app OR [email or web address] 7PM-7AM contact night coverage as above 02/15/2019, 1:12 PM  LOS: 7 days   Consultants:  n  Procedures:  n  Antimicrobials:  n  Interval history/Subjective: Awake alert despondent at being in hopsital "I wanna go home" No cp no fever no chills   Objective:  Vitals:  Vitals:   02/15/19 0556 02/15/19 0852   BP: 122/78 128/72  Pulse: 71   Resp: 15   Temp:    SpO2: 100%     Exam:   eomi ncat s s 2no m/r/g abd soft nt nd obese no HSM no other organomegally 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 aspirin EC  81 mg Oral Daily  . enoxaparin (LOVENOX) injection  80 mg Subcutaneous Q24H  . furosemide  60 mg Intravenous BID  . lisinopril  5 mg Oral Daily  . senna-docusate  1 tablet Oral BID  . sodium chloride flush  3 mL Intravenous Q12H   Continuous Infusions: . sodium chloride      Principal Problem:   Acute CHF (congestive heart failure) (HCC) Active Problems:   HTN (hypertension)   Acute respiratory failure with hypoxia (HCC)   Morbid obesity (HCC)   LOS: 7 days

## 2019-02-16 LAB — RENAL FUNCTION PANEL
ALBUMIN: 2.9 g/dL — AB (ref 3.5–5.0)
Anion gap: 10 (ref 5–15)
BUN: 18 mg/dL (ref 6–20)
CO2: 38 mmol/L — ABNORMAL HIGH (ref 22–32)
Calcium: 8.9 mg/dL (ref 8.9–10.3)
Chloride: 91 mmol/L — ABNORMAL LOW (ref 98–111)
Creatinine, Ser: 1.28 mg/dL — ABNORMAL HIGH (ref 0.61–1.24)
GFR calc Af Amer: >60 mL/min (ref >60)
GFR calc non Af Amer: >60 mL/min (ref >60)
Glucose, Bld: 99 mg/dL (ref 70–99)
PHOSPHORUS: 5.3 mg/dL — AB (ref 2.5–4.6)
Potassium: 3.5 mmol/L (ref 3.5–5.1)
Sodium: 139 mmol/L (ref 135–145)

## 2019-02-16 LAB — MAGNESIUM: Magnesium: 2.3 mg/dL (ref 1.7–2.4)

## 2019-02-16 MED ORDER — FUROSEMIDE 10 MG/ML IJ SOLN
60.0000 mg | Freq: Two times a day (BID) | INTRAMUSCULAR | Status: DC
  Administered 2019-02-16: 60 mg via INTRAVENOUS
  Filled 2019-02-16: qty 6

## 2019-02-16 NOTE — Plan of Care (Signed)

## 2019-02-16 NOTE — Progress Notes (Signed)
[facility] PROGRESS NOTE  Edwin Mcclain [id number] DOB: 07-Sep-1966 DOA: 02/07/2019 PCP: Patient, No Pcp Per   Narrative: 53 year old male Known history HTN not on meds prior to admission BMI 51 Endorses 2-week history of shortness of breath without chest pain bilateral edema and has gained >100 pounds in the past year Came to emergency room 2/18 and returned 2/19 with worsening shortness of breath and pulmonary edema    A & Plan Acute pulmonary edema, pulmonary hypertension Diuretics readjusted to 60 IV twice daily Continue slower diuresis 2/27 Biggest risk factors blood pressure See below  Uncontrolled hypertension, diastolic heart failure Both related Continue ACE for cardiac remodeling prevention, Adding in addition low-dose BiDil Eventually will add low-dose metoprolol probably around time of discharge Blood pressure is moderately controlled  OHSS/OSA Using CPAP here CM to set-up DME  patient will probably need a sleep study  BMI >50 Super morbid obesity with comorbidities-likely would benefit from counseling for GOP procedure versus other restrictive surgery He has joined a fitness program at work at [employer]  Anemia-mild-monitor Significantly microcytic so may have a component of iron deficiency Will need Hemoccult and screening as an outpatient if not done previously  DVT lovenox  Code Status:  full  Communication:  No family  Disposition Plan: inpatient    Edwin Patman, MD  [facility] Via [employer] app OR [email or web address] 7PM-7AM contact night coverage as above 02/16/2019, 12:44 PM  LOS: 8 days   Consultants:  n  Procedures:  n  Antimicrobials:  n  Interval history/Subjective:  Fair lying in bed no complaints eating drinking Does not feel more or less swollen No fever No chills   Objective:  Vitals:  Vitals:   02/16/19 0700 02/16/19 0939  BP: 102/72 105/78  Pulse: 76   Resp: 13   Temp: 97.8 F (36.6 C)   SpO2: 100%      Exam:  eomi ncat S1-S2 no murmur rub or gallop abd soft nt nd obese no HSM no other organomegally 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 aspirin EC  81 mg Oral Daily  . enoxaparin (LOVENOX) injection  80 mg Subcutaneous Q24H  . furosemide  80 mg Intravenous BID  . lisinopril  5 mg Oral Daily  . senna-docusate  1 tablet Oral BID  . sodium chloride flush  3 mL Intravenous Q12H   Continuous Infusions: . sodium chloride      Principal Problem:   Acute CHF (congestive heart failure) (HCC) Active Problems:   HTN (hypertension)   Acute respiratory failure with hypoxia (HCC)   Morbid obesity (HCC)   LOS: 8 days

## 2019-02-17 LAB — RENAL FUNCTION PANEL
Albumin: 3.1 g/dL — ABNORMAL LOW (ref 3.5–5.0)
Anion gap: 10 (ref 5–15)
BUN: 31 mg/dL — ABNORMAL HIGH (ref 6–20)
CO2: 37 mmol/L — ABNORMAL HIGH (ref 22–32)
CREATININE: 3.1 mg/dL — AB (ref 0.61–1.24)
Calcium: 9.5 mg/dL (ref 8.9–10.3)
Chloride: 91 mmol/L — ABNORMAL LOW (ref 98–111)
GFR calc Af Amer: 26 mL/min — ABNORMAL LOW (ref >60)
GFR calc non Af Amer: 22 mL/min — ABNORMAL LOW (ref >60)
Glucose, Bld: 114 mg/dL — ABNORMAL HIGH (ref 70–99)
Phosphorus: 6.4 mg/dL — ABNORMAL HIGH (ref 2.5–4.6)
Potassium: 3.5 mmol/L (ref 3.5–5.1)
Sodium: 138 mmol/L (ref 135–145)

## 2019-02-17 MED ORDER — SODIUM CHLORIDE 0.9 % IV BOLUS
250.0000 mL | Freq: Once | INTRAVENOUS | Status: AC
  Administered 2019-02-17: 250 mL via INTRAVENOUS

## 2019-02-17 NOTE — Progress Notes (Signed)
[facility] PROGRESS NOTE  Edwin Mcclain [id number] DOB: [birth year] DOA: 02/07/2019 PCP: Patient, No Pcp Per   Narrative: 53 year old male Known history HTN not on meds prior to admission BMI 51 Endorses 2-week history of shortness of breath without chest pain bilateral edema and has gained >100 pounds in the past year Came to emergency room 2/18 and returned 2/19 with worsening shortness of breath and pulmonary edema    A & Plan Acute pulmonary edema, pulmonary hypertension Diuretics on hold secondary to AKI Biggest risk factors blood pressure See below  AKI Diuresed with increasing doses of Lasix given 1 dose of metolazone but creatinine is now elevated-could also be drop in blood pressure from increasing blood pressure meds Hold diuretics at this time  Uncontrolled hypertension, diastolic heart failure Both related Holding lisinopril temporarily Blood pressure is low normal which could be etiology for AKI  OHSS/OSA Using CPAP here CM to set-up DME  patient will probably need a sleep study  BMI >50 Super morbid obesity with comorbidities-likely would benefit from counseling for GOP procedure versus other restrictive surgery He has joined a fitness program at work at [employer]  Anemia-mild-monitor Significantly microcytic so may have a component of iron deficiency Will need Hemoccult and screening as an outpatient if not done previously  DVT lovenox  Code Status:  full  Communication:  No family  Disposition Plan: inpatient    Abdoul Encinas, MD  [facility] Via [employer] app OR -[email or web address] 7PM-7AM contact night coverage as above 02/17/2019, 10:20 AM  LOS: 9 days   Consultants:  n  Procedures:  n  Antimicrobials:  n  Interval history/Subjective:   tells me he wants to go home-I have explained to him this is not possible and not a good idea medically   Objective:  Vitals:  Vitals:   02/17/19 0004 02/17/19 0545  BP:  95/63  Pulse: 79 73   Resp: (!) 24 14  Temp:  99 F (37.2 C)  SpO2: 100% 100%    Exam:  Alert pleasant African-American thick neck no icterus no pallor S1-S2 no murmur rub or gallop Abdomen soft nontender No organomegaly No lower extremity edema although stasis changes Neurologically intact   I have personally reviewed the following:  DATA   Labs:  Creatinine 13/1.07 to 18/1.2--->31/3.1  I/o --6.4, weight 173-->165 and stable  Imaging studies:  n  Medical tests:  n   Test discussed with performing physician:  n  Decision to obtain old records:  n  Review and summation of old records:  n  Scheduled Meds: . aspirin EC  81 mg Oral Daily  . enoxaparin (LOVENOX) injection  80 mg Subcutaneous Q24H  . senna-docusate  1 tablet Oral BID  . sodium chloride flush  3 mL Intravenous Q12H   Continuous Infusions: . sodium chloride      Principal Problem:   Acute CHF (congestive heart failure) (HCC) Active Problems:   HTN (hypertension)   Acute respiratory failure with hypoxia (HCC)   Morbid obesity (HCC)   LOS: 9 days

## 2019-02-17 NOTE — Progress Notes (Signed)
   02/17/19 1140  Vitals  BP (!) 91/59  MAP (mmHg) 69  Pulse Rate 88  ECG Heart Rate 88  Oxygen Therapy  SpO2 (!) 89 %  O2 Device Nasal Cannula  O2 Flow Rate (L/min) 2 L/min   Notified MD Samtani about soft BP. I will continue to monitor the patient closely.  Sheppard Evens RN

## 2019-02-18 LAB — CBC
HCT: 44.8 % (ref 39.0–52.0)
Hemoglobin: 13.1 g/dL (ref 13.0–17.0)
MCH: 20.5 pg — ABNORMAL LOW (ref 26.0–34.0)
MCHC: 29.2 g/dL — ABNORMAL LOW (ref 30.0–36.0)
MCV: 70.2 fL — ABNORMAL LOW (ref 80.0–100.0)
Platelets: 223 10*3/uL (ref 150–400)
RBC: 6.38 MIL/uL — ABNORMAL HIGH (ref 4.22–5.81)
RDW: 21.3 % — ABNORMAL HIGH (ref 11.5–15.5)
WBC: 3.7 10*3/uL — ABNORMAL LOW (ref 4.0–10.5)
nRBC: 0 % (ref 0.0–0.2)

## 2019-02-18 LAB — BASIC METABOLIC PANEL
Anion gap: 8 (ref 5–15)
BUN: 34 mg/dL — ABNORMAL HIGH (ref 6–20)
CO2: 39 mmol/L — ABNORMAL HIGH (ref 22–32)
Calcium: 9.2 mg/dL (ref 8.9–10.3)
Chloride: 93 mmol/L — ABNORMAL LOW (ref 98–111)
Creatinine, Ser: 2.03 mg/dL — ABNORMAL HIGH (ref 0.61–1.24)
GFR calc Af Amer: 43 mL/min — ABNORMAL LOW (ref >60)
GFR calc non Af Amer: 37 mL/min — ABNORMAL LOW (ref >60)
Glucose, Bld: 112 mg/dL — ABNORMAL HIGH (ref 70–99)
Potassium: 3.5 mmol/L (ref 3.5–5.1)
Sodium: 140 mmol/L (ref 135–145)

## 2019-02-18 NOTE — Progress Notes (Signed)
[facility] PROGRESS NOTE  Magnum Fluck [id number] DOB: 07/02/1966 DOA: 02/07/2019 PCP: Patient, No Pcp Per   Narrative: 53 year old male Known history HTN not on meds prior to admission BMI 51 Endorses 2-week history of shortness of breath without chest pain bilateral edema and has gained >100 pounds in the past year Came to emergency room 2/18 and returned 2/19 with worsening shortness of breath and pulmonary edema    A & Plan Acute pulmonary edema, pulmonary hypertension Diuretics on hold secondary to AKI Biggest risk factors blood pressure  AKI, mulifactorial-lasix, hyptension from BP meds Slightly better Rpt labs am and decide on continued lasix  Uncontrolled hypertension, diastolic heart failure Both related Holding lisinopril temporarily as above  OHSS/OSA Using CPAP here CM to set-up DME patient will probably need a sleep study  BMI >50 Super morbid obesity with comorbidities-likely would benefit from counseling for GOP procedure versus other restrictive surgery He has joined a fitness program at work at [employer]  Anemia-mild-monitor Significantly microcytic so may have a component of iron deficiency Will need Hemoccult and screening as an outpatient if not done previously  DVT lovenox  Code Status:  full  Communication:  No family  Disposition Plan: inpatient    Bobby Barton, MD  [facility] [employer] app OR -[email or web address] 7PM-7AM contact night coverage as above 02/18/2019, 10:50 AM  LOS: 10 days   Consultants:  n  Procedures:  n  Antimicrobials:  n  Interval history/Subjective:  Asking when he can go understands explanation regarding AKI No pain no fever no chills no rigor no sob looks abotut he same   Objective:  Vitals:  Vitals:   02/18/19 0355 02/18/19 0500  BP:  124/63  Pulse: 73 78  Resp: 18 18  Temp:  97.9 F (36.6 C)  SpO2: 100% 100%    Exam:   African-American thick neck no icterus no pallor S1-S2 no  murmur rub or gallop Abdomen soft nontender No organomegaly No lower extremity edema although stasis changes Neurologically intact   I have personally reviewed the following:  DATA   Labs:  Creatinine 13/1.07 to 18/1.2--->31/3.1-->34/2.03  I/o -7.28, total out 11.52, weight 173-->165 and stable   Scheduled Meds: . aspirin EC  81 mg Oral Daily  . enoxaparin (LOVENOX) injection  80 mg Subcutaneous Q24H  . senna-docusate  1 tablet Oral BID  . sodium chloride flush  3 mL Intravenous Q12H   Continuous Infusions: . sodium chloride      Principal Problem:   Acute CHF (congestive heart failure) (HCC) Active Problems:   HTN (hypertension)   Acute respiratory failure with hypoxia (HCC)   Morbid obesity (HCC)   LOS: 10 days

## 2019-02-18 NOTE — Progress Notes (Signed)
RT placed patient on BIPAP HS. Patient tolerating well at this time.  

## 2019-02-19 LAB — RENAL FUNCTION PANEL
ANION GAP: 10 (ref 5–15)
Albumin: 3 g/dL — ABNORMAL LOW (ref 3.5–5.0)
BUN: 20 mg/dL (ref 6–20)
CO2: 37 mmol/L — ABNORMAL HIGH (ref 22–32)
Calcium: 9.4 mg/dL (ref 8.9–10.3)
Chloride: 94 mmol/L — ABNORMAL LOW (ref 98–111)
Creatinine, Ser: 1.26 mg/dL — ABNORMAL HIGH (ref 0.61–1.24)
GFR calc Af Amer: >60 mL/min (ref >60)
GFR calc non Af Amer: >60 mL/min (ref >60)
Glucose, Bld: 105 mg/dL — ABNORMAL HIGH (ref 70–99)
Phosphorus: 3.9 mg/dL (ref 2.5–4.6)
Potassium: 3.8 mmol/L (ref 3.5–5.1)
Sodium: 141 mmol/L (ref 135–145)

## 2019-02-19 MED ORDER — TORSEMIDE 20 MG PO TABS
20.0000 mg | ORAL_TABLET | Freq: Every day | ORAL | Status: DC
  Administered 2019-02-19: 20 mg via ORAL
  Filled 2019-02-19: qty 1

## 2019-02-19 NOTE — Progress Notes (Signed)
RT placed patient on BIPAP HS. Patient tolerating well at this time.  

## 2019-02-19 NOTE — Progress Notes (Signed)
[facility] PROGRESS NOTE  Randarius Piekarski [id number] DOB: [birth year] DOA: 02/07/2019 PCP: Patient, No Pcp Per   Narrative: 53 year old male Known history HTN not on meds prior to admission BMI 51 Endorses 2-week history of shortness of breath without chest pain bilateral edema and has gained >100 pounds in the past year Came to emergency room 2/18 and returned 2/19 with worsening shortness of breath and pulmonary edema    A & Plan Acute pulmonary edema, pulmonary hypertension secondary to morbid obesity Diuresed aggressively earlier in hospital stay but bump in creatinine on 2/28 necessitated cutting back dose Starting low-dose torsemide 20 mg and titrate depending on output  AKI, mulifactorial-lasix, hypotension from BP meds Slightly better-improved after IV fluid bolus earlier in admission on 2/29 Steadily improving-C will trend  Uncontrolled hypertension, diastolic heart failure Cut back on blood pressure meds earlier in admission Will need to reimplement slowly and carefully may be amlodipine or ACE inhibitor depending on renal function  OHSS/OSA Using BiPAP as failed CPAP trial-care management order placed for BiPAP set up as an outpatient  BMI >50 Super morbid obesity with comorbidities-likely would benefit from counseling for GOP procedure versus other restrictive surgery He has joined a fitness program at work at [employer]  Anemia-mild-monitor Significantly microcytic so may have a component of iron deficiency Will need Hemoccult and screening as an outpatient if not done previously  DVT lovenox  Code Status:  full  Communication:  No family  Disposition Plan: inpatient    Kennley Schwandt, MD  [facility] [employer] app OR -[email or web address] 7PM-7AM contact night coverage as above 02/19/2019, 10:09 AM  LOS: 11 days   Consultants:  n  Procedures:  n  Antimicrobials:  n  Interval history/Subjective:  Sitting at the bedside no distress Shortness of  breath no fever no swelling again asking about home but seems to understand   Objective:  Vitals:  Vitals:   02/19/19 0000 02/19/19 0445  BP:  125/77  Pulse: 76 76  Resp:  20  Temp:  98.1 F (36.7 C)  SpO2: 100% 100%    Exam:  Awake alert pleasant no distress EOMI NCAT Neck thick soft supple Mallampati 4 S1-S2 no murmur Chest clear without added sound Abdomen obese Stasis dermatitis noted   I have personally reviewed the following:  DATA   Labs:  Creatinine 13/1.07 to 18/1.2--->31/3.1-->34/2.03-->20/1.2  I/o - 4.607, total out -6.47, weight 173--> 164 kg   Scheduled Meds: . aspirin EC  81 mg Oral Daily  . enoxaparin (LOVENOX) injection  80 mg Subcutaneous Q24H  . senna-docusate  1 tablet Oral BID  . sodium chloride flush  3 mL Intravenous Q12H   Continuous Infusions: . sodium chloride      Principal Problem:   Acute CHF (congestive heart failure) (HCC) Active Problems:   HTN (hypertension)   Acute respiratory failure with hypoxia (HCC)   Morbid obesity (HCC)   LOS: 11 days

## 2019-02-20 LAB — RENAL FUNCTION PANEL
Albumin: 3 g/dL — ABNORMAL LOW (ref 3.5–5.0)
Anion gap: 9 (ref 5–15)
BUN: 21 mg/dL — AB (ref 6–20)
CO2: 37 mmol/L — ABNORMAL HIGH (ref 22–32)
Calcium: 9.3 mg/dL (ref 8.9–10.3)
Chloride: 96 mmol/L — ABNORMAL LOW (ref 98–111)
Creatinine, Ser: 1.48 mg/dL — ABNORMAL HIGH (ref 0.61–1.24)
GFR calc Af Amer: >60 mL/min (ref >60)
GFR calc non Af Amer: 54 mL/min — ABNORMAL LOW (ref >60)
Glucose, Bld: 120 mg/dL — ABNORMAL HIGH (ref 70–99)
Phosphorus: 4.2 mg/dL (ref 2.5–4.6)
Potassium: 3.6 mmol/L (ref 3.5–5.1)
Sodium: 142 mmol/L (ref 135–145)

## 2019-02-20 LAB — MAGNESIUM: Magnesium: 2.2 mg/dL (ref 1.7–2.4)

## 2019-02-20 MED ORDER — TORSEMIDE 20 MG PO TABS
10.0000 mg | ORAL_TABLET | Freq: Every day | ORAL | Status: DC
  Administered 2019-02-20 – 2019-02-21 (×2): 10 mg via ORAL
  Filled 2019-02-20 (×2): qty 1

## 2019-02-20 NOTE — Progress Notes (Signed)
[facility] PROGRESS NOTE  Edwin Mcclain [id number] DOB: 03/15/1966 DOA: 02/07/2019 PCP: Patient, No Pcp Per   Narrative: 53 year old male Known history HTN not on meds prior to admission BMI 51 Endorses 2-week history of shortness of breath without chest pain bilateral edema and has gained >100 pounds in the past year Came to emergency room 2/18 and returned 2/19 with worsening shortness of breath and pulmonary edema    A & Plan Acute pulmonary edema, pulmonary hypertension secondary to morbid obesity Diuresed aggressively earlier in hospital stay but bump in creatinine on 2/28 necessitated cutting back dose Nearing d/c weight--continue torsemide 10 daily on d/c AKI, mulifactorial-lasix, hypotension from BP meds improved after med adjustment-continue only Torsemide at this time OP re-eval to use ACe, BB which have been held since AKI 2/28 Uncontrolled hypertension, diastolic heart failure Cut back on blood pressure meds earlier in admission-as above OHSS/OSA Using BiPAP as failed CPAP trial-will d/c on home oxygen 2l qhs-cannot at this time get bipap 2/2 affordability BMI >50 Super morbid obesity with comorbidities-likely would benefit from counseling for GOP procedure versus other restrictive surgery He has joined a fitness program at work at [employer] Anemia-mild-monitor Significantly microcytic so may have a component of iron deficiency Will need Hemoccult and screening as an outpatient if not done previously  DVT lovenox  Code Status:  full  Communication:  No family  Disposition Plan: inpatient    Keyston Ardolino, MD  [facility] Via [employer] app OR -[email or web address] 7PM-7AM contact night coverage as above 02/20/2019, 12:39 PM  LOS: 12 days   Consultants:  n  Procedures:  n  Antimicrobials:  n  Interval history/Subjective:  Overall well no distress no cp no fever  Objective:  Vitals:  Vitals:   02/19/19 2250 02/20/19 0525  BP:  124/78  Pulse: 89  75  Resp: 16 12  Temp:  98.4 F (36.9 C)  SpO2: 97% 100%    Exam:  Awake alert pleasant no distress EOMI NCAT Neck thick soft supple Mallampati 4 S1-S2 no murmur Chest clear without added sound Abdomen obese Stasis dermatitis noted   I have personally reviewed the following:  DATA   Labs:  Creatinine 13/1.07 to 18/1.2--->31/3.1-->34/2.03-->20/1.2-->21/1.4  I/o -5.2, total out --7.9, weight 173--> 164 kg-->163 k   Scheduled Meds: . aspirin EC  81 mg Oral Daily  . enoxaparin (LOVENOX) injection  80 mg Subcutaneous Q24H  . senna-docusate  1 tablet Oral BID  . sodium chloride flush  3 mL Intravenous Q12H  . torsemide  10 mg Oral Daily   Continuous Infusions: . sodium chloride      Principal Problem:   Acute CHF (congestive heart failure) (HCC) Active Problems:   HTN (hypertension)   Acute respiratory failure with hypoxia (HCC)   Morbid obesity (HCC)   LOS: 12 days

## 2019-02-21 ENCOUNTER — Encounter: Payer: Self-pay | Admitting: Family Medicine

## 2019-02-21 LAB — RENAL FUNCTION PANEL
Albumin: 2.9 g/dL — ABNORMAL LOW (ref 3.5–5.0)
Anion gap: 9 (ref 5–15)
BUN: 24 mg/dL — AB (ref 6–20)
CO2: 36 mmol/L — ABNORMAL HIGH (ref 22–32)
Calcium: 9.6 mg/dL (ref 8.9–10.3)
Chloride: 97 mmol/L — ABNORMAL LOW (ref 98–111)
Creatinine, Ser: 1.52 mg/dL — ABNORMAL HIGH (ref 0.61–1.24)
GFR calc Af Amer: >60 mL/min (ref >60)
GFR calc non Af Amer: 52 mL/min — ABNORMAL LOW (ref >60)
Glucose, Bld: 119 mg/dL — ABNORMAL HIGH (ref 70–99)
Phosphorus: 5.1 mg/dL — ABNORMAL HIGH (ref 2.5–4.6)
Potassium: 5.1 mmol/L (ref 3.5–5.1)
Sodium: 142 mmol/L (ref 135–145)

## 2019-02-21 LAB — BASIC METABOLIC PANEL
Anion gap: 10 (ref 5–15)
BUN: 21 mg/dL — AB (ref 6–20)
CO2: 34 mmol/L — ABNORMAL HIGH (ref 22–32)
Calcium: 9.6 mg/dL (ref 8.9–10.3)
Chloride: 96 mmol/L — ABNORMAL LOW (ref 98–111)
Creatinine, Ser: 1.38 mg/dL — ABNORMAL HIGH (ref 0.61–1.24)
GFR calc Af Amer: >60 mL/min (ref >60)
GFR calc non Af Amer: 59 mL/min — ABNORMAL LOW (ref >60)
Glucose, Bld: 129 mg/dL — ABNORMAL HIGH (ref 70–99)
Potassium: 3.9 mmol/L (ref 3.5–5.1)
Sodium: 140 mmol/L (ref 135–145)

## 2019-02-21 MED ORDER — TORSEMIDE 10 MG PO TABS: 10.0000 mg | ORAL_TABLET | Freq: Every day | ORAL | 0 refills | Status: DC

## 2019-02-21 MED ORDER — ASPIRIN 81 MG PO TBEC: 81.0000 mg | DELAYED_RELEASE_TABLET | Freq: Every day | ORAL | Status: DC

## 2019-02-21 NOTE — Progress Notes (Signed)
Attempted to call patient's new primary care office to have physician set up sleep study for patient, but no answer. Attempted call 3 times with no answer. Will make note on patient's discharge instructions for him to have Primary set this up when he goes for hospital follow-up on Tuesday, March 17th.

## 2019-02-21 NOTE — Progress Notes (Signed)
SATURATION QUALIFICATIONS: (This note is used to comply with regulatory documentation for home oxygen)  Patient Saturations on Room Air at Rest = 88%  Patient Saturations on Room Air while Ambulating = 85%  Patient Saturations on 2 Liters of oxygen while Ambulating = 93%  Please briefly explain why patient needs home oxygen: The patient needed oxygen to ambulate to keep stats above 90%

## 2019-02-21 NOTE — Discharge Summary (Signed)
Physician Discharge Summary  Edwin Mcclain XLK:[id number] DOB: 1965/12/25 DOA: 02/07/2019  PCP: Patient, No Pcp Per  Admit date: 02/07/2019 Discharge date: 02/21/2019  Time spent: 37 minutes  Recommendations for Outpatient Follow-up:  1. Needs outpatient labs in addition to magnesium level in about 1 to 2 weeks 2. Aim for dry weight of 350 pounds 3. New medications torsemide, aspirin 4. Suggest initiation of outpatient beta-blocker/lisinopril if blood pressure will tolerate 5. Discharging on oxygen 2 L at rest-will need outpatient split level test for BiPAP 6. Please consider setting up for outpatient colonoscopy  Discharge Diagnoses:  Principal Problem:   Acute CHF (congestive heart failure) (HCC) Active Problems:   HTN (hypertension)   Acute respiratory failure with hypoxia (HCC)   Morbid obesity (HCC)   Discharge Condition: Improved  Diet recommendation: Heart healthy low-salt fluid restricted  Filed Weights   02/19/19 0445 02/20/19 0525 02/21/19 0357  Weight: (!) 164.7 kg (!) 163.7 kg (!) 163.7 kg    History of present illness:  53 year old male Known history HTN not on meds prior to admission BMI 51 Endorses 2-week history of shortness of breath without chest pain bilateral edema and has gained >100 pounds in the past year Came to emergency room 2/18 and returned 2/19 with worsening shortness of breath and pulmonary edema  Hospital Course:  Acute pulmonary edema, pulmonary hypertension secondary to morbid obesity Diuresed aggressively earlier in hospital stay but bump in creatinine on 2/28 necessitated cutting back dose Nearing d/c weight--continue torsemide 10 daily on d/c AKI, mulifactorial-lasix, hypotension from BP meds improved after med adjustment-continue only Torsemide at this time OP re-eval to use ACe, BB which have been held since AKI On discharge BUN/creatinine was 21/1.3 Uncontrolled hypertension, diastolic heart failure Cut back on blood pressure meds  earlier in admission-as above OHSS/OSA Using BiPAP as failed CPAP trial-will d/c on home oxygen 2l qhs-cannot at this time get bipap 2/2 affordability BMI >50 Super morbid obesity with comorbidities-likely would benefit from counseling for GOP procedure versus other restrictive surgery He has joined a fitness program at work at [employer] Anemia-mild-monitor Significantly microcytic so may have a component of iron deficiency Will need Hemoccult and screening as an outpatient if not done previously  Procedures:  Echo 2/19 Sonographer Comments: Patient is morbidly obese. Image acquisition challenging due to patient body habitus. IMPRESSIONS    1. The left ventricle has normal systolic function with an ejection fraction of 60-65%. The cavity size was normal. Left ventricular diastolic parameters were normal.  2. The right ventricle has normal systolic function. The cavity was mildly enlarged. There is no increase in right ventricular wall thickness.  3. The mitral valve is normal in structure.  4. The tricuspid valve is normal in structure.  5. The aortic valve was not well visualized.  6. The pulmonic valve was normal in structure.  7. The interatrial septum was not well visualized.   Consultations:  no  Discharge Exam: Vitals:   02/20/19 2313 02/21/19 0357  BP:  121/77  Pulse: 83 81  Resp: 15   Temp:  98.9 F (37.2 C)  SpO2: 100% 100%    Awake alert pleasant coherent no distress EOMI NCAT thick neck Chest clear no added sound no rales no rhonchi S1-S2 no murmur sinus rhythm Abdomen soft no rebound no guarding obese No lower extremity edema   Discharge Instructions   Discharge Instructions    Diet - low sodium heart healthy   Complete by:  As directed    Discharge  instructions   Complete by:  As directed    Please use fluid medication in addition to aspirin You will need labs in about 1 to 2 weeks and you will need to control your fluid intake to less than  1500 cc a day Please follow-up with primary care We will order oxygen for you   Increase activity slowly   Complete by:  As directed      Allergies as of 02/21/2019   No Known Allergies     Medication List    TAKE these medications   aspirin 81 MG EC tablet Take 1 tablet (81 mg total) by mouth daily. Start taking on:  February 22, 2019   torsemide 10 MG tablet Commonly known as:  DEMADEX Take 1 tablet (10 mg total) by mouth daily. Start taking on:  February 22, 2019            Durable Medical Equipment  (From admission, onward)         Start     Ordered   02/21/19 1354  DME Oxygen  Once    Question Answer Comment  Mode or (Route) Nasal cannula   Liters per Minute 2   Oxygen delivery system Gas      02/21/19 1353         No Known Allergies Follow-up Information    Bing Neighbors, FNP Follow up on 03/07/2019.   Specialty:  Family Medicine Why:  @ 2:50 pm for hospital follow up appointment. If you can not make this scheduled follow up appointment please call to reschedule.  Contact information: [address] [place] [address] [phone]            The results of significant diagnostics from this hospitalization (including imaging, microbiology, ancillary and laboratory) are listed below for reference.    Significant Diagnostic Studies: Dg Chest 2 View  Result Date: 02/07/2019 CLINICAL DATA:  Patient states that he has cough x 2 weeks, sob, center chest pain. Current social smoker. Hx of htn EXAM: CHEST - 2 VIEW COMPARISON:  None. FINDINGS: The heart is enlarged. There is marked pulmonary vascular congestion and perihilar edema. Small bilateral pleural effusions are present. No consolidations. IMPRESSION: Cardiomegaly and pulmonary edema. Electronically Signed   By: Norva Pavlov M.D.   On: 02/07/2019 16:13    Microbiology: No results found for this or any previous visit (from the past 240 hour(s)).   Labs: Basic Metabolic  Panel: Recent Labs  Lab 02/16/19 0324 02/17/19 0415 02/18/19 0424 02/19/19 0646 02/20/19 0653 02/21/19 0348 02/21/19 1148  NA 139 138 140 141 142 142 140  K 3.5 3.5 3.5 3.8 3.6 5.1 3.9  CL 91* 91* 93* 94* 96* 97* 96*  CO2 38* 37* 39* 37* 37* 36* 34*  GLUCOSE 99 114* 112* 105* 120* 119* 129*  BUN 18 31* 34* 20 21* 24* 21*  CREATININE 1.28* 3.10* 2.03* 1.26* 1.48* 1.52* 1.38*  CALCIUM 8.9 9.5 9.2 9.4 9.3 9.6 9.6  MG 2.3  --   --   --  2.2  --   --   PHOS 5.3* 6.4*  --  3.9 4.2 5.1*  --    Liver Function Tests: Recent Labs  Lab 02/16/19 0324 02/17/19 0415 02/19/19 0646 02/20/19 0653 02/21/19 0348  ALBUMIN 2.9* 3.1* 3.0* 3.0* 2.9*   No results for input(s): LIPASE, AMYLASE in the last 168 hours. No results for input(s): AMMONIA in the last 168 hours. CBC: Recent Labs  Lab 02/18/19  0424  WBC 3.7*  HGB 13.1  HCT 44.8  MCV 70.2*  PLT 223   Cardiac Enzymes: No results for input(s): CKTOTAL, CKMB, CKMBINDEX, TROPONINI in the last 168 hours. BNP: BNP (last 3 results) Recent Labs    02/07/19 2312  BNP 199.7*    ProBNP (last 3 results) No results for input(s): PROBNP in the last 8760 hours.  CBG: No results for input(s): GLUCAP in the last 168 hours.     Signed:  Rhetta Mura MD   [facility] 02/21/2019, 1:54 PM

## 2019-02-21 NOTE — Discharge Instructions (Signed)
Please have Primary Care Physician schedule a Sleep Study for you during your hospital follow-up on Tuesday, March 17th.

## 2019-03-02 DIAGNOSIS — J9601 Acute respiratory failure with hypoxia: Secondary | ICD-10-CM | POA: Diagnosis not present

## 2019-03-07 ENCOUNTER — Encounter: Payer: Self-pay | Admitting: Family Medicine

## 2019-03-07 ENCOUNTER — Other Ambulatory Visit: Payer: Self-pay

## 2019-03-07 ENCOUNTER — Ambulatory Visit (INDEPENDENT_AMBULATORY_CARE_PROVIDER_SITE_OTHER): Payer: 59 | Admitting: Family Medicine

## 2019-03-07 VITALS — BP 149/89 | HR 93 | Temp 98.0°F | Resp 18 | Ht 70.0 in | Wt 370.4 lb

## 2019-03-07 DIAGNOSIS — I509 Heart failure, unspecified: Secondary | ICD-10-CM

## 2019-03-07 DIAGNOSIS — R609 Edema, unspecified: Secondary | ICD-10-CM

## 2019-03-07 DIAGNOSIS — G4730 Sleep apnea, unspecified: Secondary | ICD-10-CM | POA: Diagnosis not present

## 2019-03-07 DIAGNOSIS — Z131 Encounter for screening for diabetes mellitus: Secondary | ICD-10-CM | POA: Diagnosis not present

## 2019-03-07 DIAGNOSIS — Z7689 Persons encountering health services in other specified circumstances: Secondary | ICD-10-CM

## 2019-03-07 DIAGNOSIS — Z713 Dietary counseling and surveillance: Secondary | ICD-10-CM

## 2019-03-07 DIAGNOSIS — I10 Essential (primary) hypertension: Secondary | ICD-10-CM | POA: Diagnosis not present

## 2019-03-07 DIAGNOSIS — R238 Other skin changes: Secondary | ICD-10-CM

## 2019-03-07 DIAGNOSIS — Z6841 Body Mass Index (BMI) 40.0 and over, adult: Secondary | ICD-10-CM

## 2019-03-07 MED ORDER — CARVEDILOL 12.5 MG PO TABS: 12.5000 mg | ORAL_TABLET | Freq: Two times a day (BID) | ORAL | 3 refills | Status: DC

## 2019-03-07 MED ORDER — TORSEMIDE 20 MG PO TABS: 20.0000 mg | ORAL_TABLET | Freq: Every day | ORAL | 3 refills | Status: DC

## 2019-03-07 MED ORDER — TORSEMIDE 10 MG PO TABS: 10.0000 mg | ORAL_TABLET | Freq: Every day | ORAL | 3 refills | Status: DC

## 2019-03-07 MED ORDER — TRIAMCINOLONE ACETONIDE 0.5 % EX OINT: 1.0000 "application " | TOPICAL_OINTMENT | Freq: Two times a day (BID) | CUTANEOUS | 1 refills | Status: DC | PRN

## 2019-03-07 NOTE — Patient Instructions (Addendum)
Thank you for choosing Primary Care at [facility] to be your medical home!    Edwin Mcclain was seen by Joaquin Courts, FNP today.   [name]'s primary care provider is Bing Neighbors, FNP.   For the best care possible, you should try to see Joaquin Courts, FNP-C whenever you come to the clinic.   We look forward to seeing you again soon!  If you have any questions about your visit today, please call us at [phone] or feel free to reach your primary care provider via MyChart.     Increased Demadex 20 mg once daily to improve swelling. Continue oxygen every night and during day as needed.  New medication added for heart failure management and Blood pressure- carvedilol 12.5 mg twice daily  Topical steroid sent for treatment of injection site skin swelling      Heart Failure  Heart failure means your heart has trouble pumping blood. This makes it hard for your body to work well. Heart failure is usually a long-term (chronic) condition. You must take good care of yourself and follow your treatment plan from your doctor. Follow these instructions at home: Medicines  Take over-the-counter and prescription medicines only as told by your doctor. ? Do not stop taking your medicine unless your doctor told you to do that. ? Do not skip any doses. ? Refill your prescriptions before you run out of medicine. You need your medicines every day. Eating and drinking   Eat heart-healthy foods. Talk with a diet and nutrition specialist (dietitian) to make an eating plan.  Choose foods that: ? Have no trans fat. ? Are low in saturated fat and cholesterol.  Choose healthy foods, like: ? Fresh or frozen fruits and vegetables. ? Fish. ? Low-fat (lean) meats. ? Legumes (like beans, peas, and lentils). ? Fat-free or low-fat dairy products. ? Whole-grain foods. ? High-fiber foods.  Limit salt (sodium) if told by your doctor. Ask your nutrition specialist to recommend  heart-healthy seasonings.  Cook in healthy ways instead of frying. Healthy ways of cooking include: ? Roasting. ? Grilling. ? Broiling. ? Baking. ? Poaching. ? Steaming. ? Stir-frying.  Limit how much fluid you drink, if told by your doctor. Lifestyle  Do not smoke or use chewing tobacco. Do not use nicotine gum or patches before talking to your doctor.  Limit alcohol intake to no more than 1 drink a day for non-pregnant women and 2 drinks a day for men. One drink equals 12 oz of beer, 5 oz of wine, or 1 oz of hard liquor. ? Tell your doctor if you drink alcohol many times a week. ? Talk with your doctor about whether any alcohol is safe for you. ? You should stop drinking alcohol: ? If your heart has been damaged by alcohol. ? You have very bad heart failure.  Do not use illegal drugs.  Lose weight if told by your doctor.  Do moderate physical activity if told by your doctor. Ask your doctor what activities are safe for you if: ? You are of older age (elderly). ? You have very bad heart failure. Keep track of important information  Weigh yourself every day. ? Weigh yourself every morning after you pee (urinate) and before breakfast. ? Wear the same amount of clothing each time. ? Write down your daily weight. Give your record to your doctor.  Check and write down your blood pressure as told by your doctor.  Check your pulse as told by your  doctor. Dealing with heat and cold  If the weather is very hot: ? Avoid activity that takes a lot of energy. ? Use air conditioning or fans, or find a cooler place. ? Avoid caffeine. ? Avoid alcohol. ? Wear clothing that is loose-fitting, lightweight, and light-colored.  If the weather is very cold: ? Avoid activity that takes a lot of energy. ? Layer your clothes. ? Wear mittens or gloves, a hat, and a scarf when you go outside. ? Avoid alcohol. General instructions  Manage other conditions that you have as told by your  doctor.  Learn to manage stress. If you need help, ask your doctor.  Plan rest periods for when you get tired.  Get education and support as needed.  Get rehab (rehabilitation) to help you stay independent and to help with everyday tasks.  Stay up to date with shots (immunizations), especially pneumococcal and flu (influenza) shots.  Keep all follow-up visits as told by your doctor. This is important. Contact a doctor if:  You gain weight quickly.  You are more short of breath than normal.  You cannot do your normal activities.  You tire easily.  You cough more than normal, especially with activity.  You have any or more puffiness (swelling) in areas such as your hands, feet, ankles, or belly (abdomen).  You cannot sleep because it is hard to breathe.  You feel like your heart is beating fast (palpitations).  You get dizzy or light-headed when you stand up. Get help right away if:  You have trouble breathing.  You or someone else notices a change in your awareness. This could be trouble staying awake or trouble concentrating.  You have chest pain or discomfort.  You pass out (faint). Summary  Heart failure means your heart has trouble pumping blood.  Make sure you refill your prescriptions before you run out of medicine. You need your medicines every day.  Keep records of your weight and blood pressure to give to your doctor.  Contact a doctor if you gain weight quickly. This information is not intended to replace advice given to you by your health care provider. Make sure you discuss any questions you have with your health care provider. Document Released: 09/15/2008 Document Revised: 08/31/2018 Document Reviewed: 12/29/2016 Elsevier Interactive Patient Education  2019 [employer]    Sleep Apnea Sleep apnea affects breathing during sleep. It causes breathing to stop for a short time or to become shallow. It can also increase the risk of:  Heart  attack.  Stroke.  Being very overweight (obese).  Diabetes.  Heart failure.  Irregular heartbeat. The goal of treatment is to help you breathe normally again. What are the causes? There are three kinds of sleep apnea:  Obstructive sleep apnea. This is caused by a blocked or collapsed airway.  Central sleep apnea. This happens when the brain does not send the right signals to the muscles that control breathing.  Mixed sleep apnea. This is a combination of obstructive and central sleep apnea. The most common cause of this condition is a collapsed or blocked airway. This can happen if:  Your throat muscles are too relaxed.  Your tongue and tonsils are too large.  You are overweight.  Your airway is too small. What increases the risk?  Being overweight.  Smoking.  Having a small airway.  Being older.  Being male.  Drinking alcohol.  Taking medicines to calm yourself (sedatives or tranquilizers).  Having family members with the condition. What  are the signs or symptoms?  Trouble staying asleep.  Being sleepy or tired during the day.  Getting angry a lot.  Loud snoring.  Headaches in the morning.  Not being able to focus your mind (concentrate).  Forgetting things.  Less interest in sex.  Mood swings.  Personality changes.  Feelings of sadness (depression).  Waking up a lot during the night to pee (urinate).  Dry mouth.  Sore throat. How is this diagnosed?  Your medical history.  A physical exam.  A test that is done when you are sleeping (sleep study). The test is most often done in a sleep lab but may also be done at home. How is this treated?   Sleeping on your side.  Using a medicine to get rid of mucus in your nose (decongestant).  Avoiding the use of alcohol, medicines to help you relax, or certain pain medicines (narcotics).  Losing weight, if needed.  Changing your diet.  Not smoking.  Using a machine to open your  airway while you sleep, such as: ? An oral appliance. This is a mouthpiece that shifts your lower jaw forward. ? A CPAP device. This device blows air through a mask when you breathe out (exhale). ? An EPAP device. This has valves that you put in each nostril. ? A BPAP device. This device blows air through a mask when you breathe in (inhale) and breathe out.  Having surgery if other treatments do not work. It is important to get treatment for sleep apnea. Without treatment, it can lead to:  High blood pressure.  Coronary artery disease.  In men, not being able to have an erection (impotence).  Reduced thinking ability. Follow these instructions at home: Lifestyle  Make changes that your doctor recommends.  Eat a healthy diet.  Lose weight if needed.  Avoid alcohol, medicines to help you relax, and some pain medicines.  Do not use any products that contain nicotine or tobacco, such as cigarettes, e-cigarettes, and chewing tobacco. If you need help quitting, ask your doctor. General instructions  Take over-the-counter and prescription medicines only as told by your doctor.  If you were given a machine to use while you sleep, use it only as told by your doctor.  If you are having surgery, make sure to tell your doctor you have sleep apnea. You may need to bring your device with you.  Keep all follow-up visits as told by your doctor. This is important. Contact a doctor if:  The machine that you were given to use during sleep bothers you or does not seem to be working.  You do not get better.  You get worse. Get help right away if:  Your chest hurts.  You have trouble breathing in enough air.  You have an uncomfortable feeling in your back, arms, or stomach.  You have trouble talking.  One side of your body feels weak.  A part of your face is hanging down. These symptoms may be an emergency. Do not wait to see if the symptoms will go away. Get medical help right  away. Call your local emergency services (911 in the [place] Do not drive yourself to the hospital. Summary  This condition affects breathing during sleep.  The most common cause is a collapsed or blocked airway.  The goal of treatment is to help you breathe normally while you sleep. This information is not intended to replace advice given to you by your health care provider. Make sure you discuss  any questions you have with your health care provider. Document Released: 09/15/2008 Document Revised: 08/02/2018 Document Reviewed: 08/02/2018 Elsevier Interactive Patient Education  [employer].

## 2019-03-07 NOTE — Progress Notes (Signed)
Did 6 minute walk with patient on room air. Patient's O2 was at 94% on room air. O2 dropped to 89%. Patient didn't have any stops but did slow pace twice.

## 2019-03-07 NOTE — Progress Notes (Signed)
Edwin Mcclain, is a 53 y.o. male  WUJ:[id number]  NFA:[id number]  DOB - 1966/12/05  CC:  Chief Complaint  Patient presents with  . Establish Care  . Hospitalization Follow-up    ED->Hosp 2/18-3/3: acute CHF, acute respiratory failure w/hypoxia       HPI: Edwin Mcclain is a 53 y.o. male is here today to establish care.   Edwin Mcclain has HTN (hypertension); Acute CHF (congestive heart failure) (HCC); Acute respiratory failure with hypoxia (HCC); and Morbid obesity (HCC) on their problem list.   Hospital follow-up Patient initially presented to urgent care on 02/07/19 with a complaint of shortness of breath and cough. Chest x-ray performed at urgent care revealed bilateral pleural effusions and cardiomegaly.  Urgent care provider recommended ER follow-up for patient.  Upon presenting to the ER, patient was admitted for congestive heart failure acute respiratory failure. Placed on BiPAP and received multiple rounds of Lasix diuresis.  He was resumed on hypertension medications while impatient. Normal EF 60-65%. He remained dependent 2 L of oxygen while in hospital.  He was subsequently weaned of daytime oxygen and discharge on 2 L of oxygen at bedtime and PRN.   Patient reports that he has returned to work and denies any dyspnea with exertion. He reports his occupation requires lots of physical activity and he has been able to tolerate activity. His oxygen level on arrival was 92% usually however with rest improved to 94% on room air.  He is currently prescribed torsemide for fluid management. He was started on Lisinopril which was discontinued during hospitalization due to AKI. He was discharged without medication for blood pressure control. BP elevated today. He doesn't monitor BP at home therefore uncertain if readings have been elevated since discharge. He is making efforts to restrict sodium. Patient denies SOB. He reports compliance with bedtime oxygen and doesn't use oxygen during the day. No daily  weights at home as he doesn't have scale. His discharge weight 360.8 lbs and his weight is increase 370 lbs.  Current medications: Current Outpatient Medications:  .  aspirin EC 81 MG EC tablet, Take 1 tablet (81 mg total) by mouth daily., Disp: , Rfl:  .  torsemide (DEMADEX) 10 MG tablet, Take 1 tablet (10 mg total) by mouth daily., Disp: 30 tablet, Rfl: 3 .  carvedilol (COREG) 12.5 MG tablet, Take 1 tablet (12.5 mg total) by mouth 2 (two) times daily with a meal., Disp: 60 tablet, Rfl: 3   Pertinent family medical history: family history includes Cancer in his mother; Leukemia in his father.   No Known Allergies  Social History   Socioeconomic History  . Marital status: Single    Spouse name: Not on file  . Number of children: Not on file  . Years of education: Not on file  . Highest education level: Not on file  Occupational History  . Not on file  Social Needs  . Financial resource strain: Not on file  . Food insecurity:    Worry: Not on file    Inability: Not on file  . Transportation needs:    Medical: Not on file    Non-medical: Not on file  Tobacco Use  . Smoking status: Never Smoker  . Smokeless tobacco: Never Used  Substance and Sexual Activity  . Alcohol use: Never    Frequency: Never  . Drug use: Never  . Sexual activity: Not on file  Lifestyle  . Physical activity:    Days per week: Not on file  Minutes per session: Not on file  . Stress: Not on file  Relationships  . Social connections:    Talks on phone: Not on file    Gets together: Not on file    Attends religious service: Not on file    Active member of club or organization: Not on file    Attends meetings of clubs or organizations: Not on file    Relationship status: Not on file  . Intimate partner violence:    Fear of current or ex partner: Not on file    Emotionally abused: Not on file    Physically abused: Not on file    Forced sexual activity: Not on file  Other Topics Concern  . Not  on file  Social History Narrative  . Not on file    Review of Systems: Pertinent negatives listed in HPI Objective:   Vitals:   03/07/19 1505  BP: (!) 149/89  Pulse: 93  Resp: 18  Temp: 98 F (36.7 C)  SpO2: 92%    BP Readings from Last 3 Encounters:  03/07/19 (!) 149/89  02/21/19 121/77  02/07/19 (!) 145/93    Filed Weights   03/07/19 1505  Weight: (!) 370 lb 6.4 oz (168 kg)     Physical Exam Constitutional: Obese, cooperative, no distress HENT: Normocephalic, atraumatic, External right and left ear normal.  Eyes: Conjunctivae and EOM are normal. PERRLA, no scleral icterus. Neck: Normal ROM. Neck supple. No JVD. No tracheal deviation. No thyromegaly. CVS: RRR, S1/S2 +, no murmurs, no gallops, no carotid bruit.  Pulmonary: Diminished breath sounds, increased respiratory effort noted with ambulation.  No wheezes or rales.  Abdominal: Soft. BS +, no distension, tenderness, rebound or guarding.  Musculoskeletal: Bilateral +3 pitting edema Neuro: Alert. Normal reflexes, muscle tone coordination. No cranial nerve deficit. Skin: Skin is warm and dry. No rash noted. Not diaphoretic. No erythema. No pallor. Psychiatric: Normal mood and affect. Behavior, judgment, thought content normal. Lab Results (prior encounters)  Lab Results  Component Value Date   WBC 3.7 (L) 02/18/2019   HGB 13.1 02/18/2019   HCT 44.8 02/18/2019   MCV 70.2 (L) 02/18/2019   PLT 223 02/18/2019   Lab Results  Component Value Date   CREATININE 1.38 (H) 02/21/2019   BUN 21 (H) 02/21/2019   NA 140 02/21/2019   K 3.9 02/21/2019   CL 96 (L) 02/21/2019   CO2 34 (H) 02/21/2019       Assessment and plan:  1. Encounter to establish care 2. Acute heart failure, unspecified heart failure type (HCC) Weight increased x 10 lbs. Increasing Torsemide 20 mg once daily  - Ambulatory referral to Cardiology, HF clinic   3. Essential hypertension -Start carvedilol 12.5 mg twice daily   - Ambulatory  referral to Cardiology - Comprehensive metabolic panel  4. Hypomagnesemia - Magnesium  5. Sleep apnea, unspecified type Continue 2 liters of night-time oxygen  - Split night study; Future  6. Morbid obesity (HCC) Encouraged efforts to reduce weight include engaging in physical activity as tolerated with goal of 150 minutes per week. Improve dietary choices and eat a meal regimen consistent with a Mediterranean or DASH diet. Reduce simple carbohydrates. Do not skip meals and eat healthy snacks throughout the day to avoid over-eating at dinner. Set a goal weight loss that is achievable for you. - Ordered Split night study; Future   Return in about 2 months (around 05/07/2019).   The patient was given clear instructions to go to ER  or return to medical center if symptoms don't improve, worsen or new problems develop. The patient verbalized understanding. The patient was advised  to call and obtain lab results if they haven't heard anything from out office within 7-10 business days.  Joaquin Courts, FNP Primary Care at [facility] [address] [place] [address] [phone]: [phone]    This note has been created with Dragon speech recognition software and Paediatric nurse. Any transcriptional errors are unintentional.

## 2019-03-08 LAB — COMPREHENSIVE METABOLIC PANEL
ALT: 19 IU/L (ref 0–44)
AST: 15 IU/L (ref 0–40)
Albumin/Globulin Ratio: 1.5 (ref 1.2–2.2)
Albumin: 4.2 g/dL (ref 3.8–4.9)
Alkaline Phosphatase: 91 IU/L (ref 39–117)
BUN/Creatinine Ratio: 10 (ref 9–20)
BUN: 10 mg/dL (ref 6–24)
Bilirubin Total: <0.2 mg/dL (ref 0.0–1.2)
CALCIUM: 9.4 mg/dL (ref 8.7–10.2)
CO2: 25 mmol/L (ref 20–29)
CREATININE: 1.03 mg/dL (ref 0.76–1.27)
Chloride: 100 mmol/L (ref 96–106)
GFR calc non Af Amer: 84 mL/min/{1.73_m2} (ref >59)
GFR, EST AFRICAN AMERICAN: 97 mL/min/{1.73_m2} (ref >59)
Globulin, Total: 2.8 g/dL (ref 1.5–4.5)
Glucose: 83 mg/dL (ref 65–99)
Potassium: 4.2 mmol/L (ref 3.5–5.2)
Sodium: 141 mmol/L (ref 134–144)
Total Protein: 7 g/dL (ref 6.0–8.5)

## 2019-03-08 LAB — HEMOGLOBIN A1C
Est. average glucose Bld gHb Est-mCnc: 154 mg/dL
Hgb A1c MFr Bld: 7 % — ABNORMAL HIGH (ref 4.8–5.6)

## 2019-03-08 LAB — MAGNESIUM: Magnesium: 2 mg/dL (ref 1.6–2.3)

## 2019-03-09 ENCOUNTER — Telehealth: Payer: Self-pay | Admitting: Family Medicine

## 2019-03-10 MED ORDER — BLOOD GLUCOSE METER KIT: PACK | 0 refills | Status: DC

## 2019-03-10 MED ORDER — METFORMIN HCL 500 MG PO TABS: 500.0000 mg | ORAL_TABLET | Freq: Two times a day (BID) | ORAL | 3 refills | Status: DC

## 2019-03-10 NOTE — Telephone Encounter (Signed)
Notify patient recent labs were significant for an A1c of 7.0 which is significant for type 2 diabetes.  I would like to start him on metformin 500 mg twice daily.  I am sending medication over to pharmacy.  I am also prescribing him a glucometer to check his blood sugars at least once a day.  I would put instructions with a glucometer for the pharmacist to demonstrate use.

## 2019-03-10 NOTE — Telephone Encounter (Signed)
Pt name and DOB verified. Patient aware of results and result note per Joaquin Courts. Pt aware that medication were sent to pharmacy: [facility] [address]. Pt verbalized understanding.

## 2019-03-14 ENCOUNTER — Telehealth: Payer: Self-pay | Admitting: Family Medicine

## 2019-03-14 NOTE — Telephone Encounter (Signed)
Patient stopped by the office to sign his portion of the paperwork. Faxed paperwork to [employer]. Awaiting confirmation.

## 2019-03-14 NOTE — Telephone Encounter (Signed)
Contact patient and request the date he returned to work. Patient also did not sign the section of FMLA documentation, permitting me to provide medical information to his employer. He will need to come by office to sign. I will bring documents to you to hold on to until patient returns to office to sign.

## 2019-03-14 NOTE — Telephone Encounter (Signed)
Called patient. He states that he returned to work on 02/26/2019. Aware that he will need to come by the office to sign the paperwork prior to [place] being able to fax to [phone]).

## 2019-03-20 ENCOUNTER — Telehealth: Payer: Self-pay | Admitting: Family Medicine

## 2019-03-20 NOTE — Telephone Encounter (Signed)
Patient called stating that he was having left thigh pain, patient stated that we he is laying down it doesn't hurt as bad but there is still constant pain and he is unsure what to do. Patient is aware we are only seeing urgent sick visits at the office at the moment but would like some advice. Please follow up.

## 2019-03-20 NOTE — Telephone Encounter (Signed)
Patient advised to be evaluated at the urgent care.

## 2019-03-21 ENCOUNTER — Ambulatory Visit (HOSPITAL_COMMUNITY)
Admission: EM | Admit: 2019-03-21 | Discharge: 2019-03-21 | Disposition: A | Discharge status: Home / Self Care | Payer: 59 | Attending: Family Medicine | Admitting: Family Medicine

## 2019-03-21 ENCOUNTER — Encounter (HOSPITAL_COMMUNITY): Payer: Self-pay | Admitting: Emergency Medicine

## 2019-03-21 ENCOUNTER — Other Ambulatory Visit: Payer: Self-pay

## 2019-03-21 DIAGNOSIS — M7062 Trochanteric bursitis, left hip: Secondary | ICD-10-CM | POA: Diagnosis not present

## 2019-03-21 MED ORDER — METHYLPREDNISOLONE ACETATE 40 MG/ML IJ SUSP
INTRAMUSCULAR | Status: AC
  Filled 2019-03-21: qty 1

## 2019-03-21 NOTE — ED Provider Notes (Signed)
[facility]    CSN: [id number] Arrival date & time: 03/21/19  1430     History   Chief Complaint Chief Complaint  Patient presents with  . Leg Pain    HPI Liliana Brentlinger is a 53 y.o. male.   HPI  Patient is here for left hip and thigh pain.  Is been present for 3 days now.  He states it is worse when he walks his dog is better when he rests.  If he rolls over at night as it will wake him up.  No numbness, no weakness.  No history of back or hip problems.  No fall or injury.  No change in activity.  No change in shoewear.  He called his primary care doctor.  They recommend that he be seen.  No numbness. Patient is compliant with his medical care.  He is seeing his primary care doctor.  His congestive heart failure is much improved on medication.  He has an appointment upcoming with cardiology.  Denies chest pain or shortness of breath.  His edema is improved.  Past Medical History:  Diagnosis Date  . HTN (hypertension)   . Morbid obesity [facility])     Patient Active Problem List   Diagnosis Date Noted  . Acute CHF (congestive heart failure) ([place]) 02/08/2019  . Acute respiratory failure with hypoxia ([place]) 02/08/2019  . HTN (hypertension)   . Morbid obesity ([place])     Past Surgical History:  Procedure Laterality Date  . C-spine surgery         Home Medications    Prior to Admission medications   Medication Sig Start Date End Date Taking? Authorizing Provider  aspirin EC 81 MG EC tablet Take 1 tablet (81 mg total) by mouth daily. 02/22/19   Nita Sells, MD  blood glucose meter kit and supplies Dispense based on patient and insurance preference. Check sugar once daily or prn. (FOR ICD-10 E10.9, E11.9). 03/10/19   Scot Jun, FNP  carvedilol (COREG) 12.5 MG tablet Take 1 tablet (12.5 mg total) by mouth 2 (two) times daily with a meal. 03/07/19   Scot Jun, FNP  metFORMIN (GLUCOPHAGE) 500 MG tablet Take 1 tablet (500 mg total) by mouth 2 (two)  times daily with a meal. 03/10/19   Scot Jun, FNP  torsemide (DEMADEX) 20 MG tablet Take 1 tablet (20 mg total) by mouth daily. 03/07/19   Scot Jun, FNP  triamcinolone ointment (KENALOG) 0.5 % Apply 1 application topically 2 (two) times daily as needed. 03/07/19   Scot Jun, FNP    Family History Family History  Problem Relation Age of Onset  . Cancer Mother        Patient is not sure which type of cancer  . Leukemia Father     Social History Social History   Tobacco Use  . Smoking status: Never Smoker  . Smokeless tobacco: Never Used  Substance Use Topics  . Alcohol use: Never    Frequency: Never  . Drug use: Never     Allergies   Patient has no known allergies.   Review of Systems Review of Systems  Constitutional: Negative for chills and fever.  HENT: Negative for ear pain and sore throat.   Eyes: Negative for pain and visual disturbance.  Respiratory: Negative for cough and shortness of breath.   Cardiovascular: Negative for chest pain and palpitations.  Gastrointestinal: Negative for abdominal pain and vomiting.  Genitourinary: Negative for dysuria and hematuria.  Musculoskeletal: Positive for gait problem. Negative for arthralgias and back pain.  Skin: Negative for color change and rash.  Neurological: Negative for seizures and syncope.  All other systems reviewed and are negative.    Physical Exam Triage Vital Signs ED Triage Vitals  Enc Vitals Group     BP 03/21/19 1454 137/85     Pulse Rate 03/21/19 1454 85     Resp 03/21/19 1454 20     Temp 03/21/19 1454 98.4 F (36.9 C)     Temp Source 03/21/19 1454 Oral     SpO2 03/21/19 1454 96 %     Weight --      Height --      Head Circumference --      Peak Flow --      Pain Score 03/21/19 1451 7     Pain Loc --      Pain Edu? --      Excl. in [place]? --    No data found.  Updated Vital Signs BP 137/85 (BP Location: Left Arm)   Pulse 85   Temp 98.4 F (36.9 C) (Oral)    Resp 20   SpO2 96%      Physical Exam Constitutional:      General: He is not in acute distress.    Appearance: He is well-developed. He is obese.     Comments: Antalgic gait  HENT:     Head: Normocephalic and atraumatic.  Eyes:     Conjunctiva/sclera: Conjunctivae normal.     Pupils: Pupils are equal, round, and reactive to light.  Neck:     Musculoskeletal: Normal range of motion.  Cardiovascular:     Rate and Rhythm: Normal rate and regular rhythm.     Heart sounds: Normal heart sounds.  Pulmonary:     Effort: Pulmonary effort is normal. No respiratory distress.     Breath sounds: Normal breath sounds. No rales.  Abdominal:     General: There is no distension.     Palpations: Abdomen is soft.  Musculoskeletal: Normal range of motion.     Right lower leg: Edema present.     Left lower leg: Edema present.     Comments: Lumbar spine is straight and symmetric.  Nontender.  No pain with range of motion of lumbar spine.  No pain with range of motion of either hip.  Straight leg raise is negative bilaterally.  Reflexes symmetric in both lower extremities.  Patient has very rough thickened skin in both ankles with moderate pedal edema.  Discussed skin care.  Patient has point tenderness over the left greater trochanter.  Skin:    General: Skin is warm and dry.  Neurological:     Mental Status: He is alert.  Psychiatric:        Mood and Affect: Mood normal.        Behavior: Behavior normal.   Procedure   Maximum tenderness is identified over lateral trochanter prominence.  Areas marked.  Skin is frozen after appropriate cleansing.  1/2 cc of Depo-Medrol with 2 cc of lidocaine is injected overlying the painful area for 20 milligrams.  Patient tolerated well   UC Treatments / Results  Labs (all labs ordered are listed, but only abnormal results are displayed) Labs Reviewed - No data to display  EKG None  Radiology No results found.  Procedures Procedures (including  critical care time)  Medications Ordered in UC Medications - No data to display  Initial Impression / Assessment and  Plan / UC Course  I have reviewed the triage vital signs and the nursing notes.  Pertinent labs & imaging results that were available during my care of the patient were reviewed by me and considered in my medical decision making (see chart for details).      Final Clinical Impressions(s) / UC Diagnoses   Final diagnoses:  Trochanteric bursitis of left hip     Discharge Instructions     Activity as tolerated Ice to area 20 min every couple of hours Expect improvement in a couple of days   ED Prescriptions    None     Controlled Substance Prescriptions [place] Controlled Substance Registry consulted? Not Applicable   Raylene Everts, MD 03/21/19 2032

## 2019-03-21 NOTE — ED Triage Notes (Signed)
While walking on Sunday, noticed pain in lateral left thigh.  Pain is constant regardless of position or activity.  Sitting or lying is most comfortable, but still has pain.  Denies any history of this prior to now

## 2019-03-21 NOTE — Discharge Instructions (Addendum)
Activity as tolerated Ice to area 20 min every couple of hours Expect improvement in a couple of days

## 2019-03-28 ENCOUNTER — Ambulatory Visit
Admission: EM | Admit: 2019-03-28 | Discharge: 2019-03-28 | Disposition: A | Discharge status: Home / Self Care | Payer: 59 | Attending: Physician Assistant | Admitting: Physician Assistant

## 2019-03-28 DIAGNOSIS — M25552 Pain in left hip: Secondary | ICD-10-CM | POA: Diagnosis not present

## 2019-03-28 MED ORDER — MELOXICAM 7.5 MG PO TABS: 7.5000 mg | ORAL_TABLET | Freq: Every day | ORAL | 0 refills | Status: DC

## 2019-03-28 NOTE — ED Triage Notes (Signed)
Pt c/o pt to left upper outer thigh x2wks. Denies injury, states seen and tx'd at [facility] with a shot and now its worse.

## 2019-03-28 NOTE — Discharge Instructions (Signed)
Start Mobic. Do not take ibuprofen (motrin/advil)/ naproxen (aleve) while on mobic. Continue ice compress, rest. Follow up with PCP if symptoms still not improving.

## 2019-03-28 NOTE — ED Provider Notes (Signed)
Edwin Mcclain    CSN: [id number] Arrival date & time: 03/28/19  1502     History   Chief Complaint Chief Complaint  Patient presents with  . Leg Pain    HPI Edwin Mcclain is a 53 y.o. male.   53 year old male comes in for continued left hip/thigh pain. He was seen 1 week ago for similar symptoms, was diagnosed with trochanteric bursitis of the left hip and had steroid injection to the bursa. States no obvious symptomatic relief post injection and symptoms has been worse since then. Pain is now constant, with worsening during movement. Denies radiation of pain. Denies numbness, tingling, back pain, loss of bladder or bowel control. He has not been taking any medicine after being seen last week.    HPI from 03/21/2019: Patient is here for left hip and thigh pain.  Is been present for 3 days now.  He states it is worse when he walks his dog is better when he rests.  If he rolls over at night as it will wake him up.  No numbness, no weakness.  No history of back or hip problems.  No fall or injury.  No change in activity.  No change in shoewear.  He called his primary Mcclain doctor.  They recommend that he be seen.  No numbness. Patient is compliant with his medical Mcclain.  He is seeing his primary Mcclain doctor.  His congestive heart failure is much improved on medication.  He has an appointment upcoming with cardiology.  Denies chest pain or shortness of breath.  His edema is improved.     Past Medical History:  Diagnosis Date  . HTN (hypertension)   . Morbid obesity [facility])     Patient Active Problem List   Diagnosis Date Noted  . Acute CHF (congestive heart failure) ([place]) 02/08/2019  . Acute respiratory failure with hypoxia ([place]) 02/08/2019  . HTN (hypertension)   . Morbid obesity ([place])     Past Surgical History:  Procedure Laterality Date  . C-spine surgery         Home Medications    Prior to Admission medications   Medication Sig Start Date End Date Taking?  Authorizing Provider  aspirin EC 81 MG EC tablet Take 1 tablet (81 mg total) by mouth daily. 02/22/19   Nita Sells, MD  blood glucose meter kit and supplies Dispense based on patient and insurance preference. Check sugar once daily or prn. (FOR ICD-10 E10.9, E11.9). 03/10/19   Scot Jun, FNP  carvedilol (COREG) 12.5 MG tablet Take 1 tablet (12.5 mg total) by mouth 2 (two) times daily with a meal. 03/07/19   Scot Jun, FNP  meloxicam (MOBIC) 7.5 MG tablet Take 1 tablet (7.5 mg total) by mouth daily. 03/28/19   Tasia Catchings, Amy V, PA-C  metFORMIN (GLUCOPHAGE) 500 MG tablet Take 1 tablet (500 mg total) by mouth 2 (two) times daily with a meal. 03/10/19   Scot Jun, FNP  torsemide (DEMADEX) 20 MG tablet Take 1 tablet (20 mg total) by mouth daily. 03/07/19   Scot Jun, FNP  triamcinolone ointment (KENALOG) 0.5 % Apply 1 application topically 2 (two) times daily as needed. 03/07/19   Scot Jun, FNP    Family History Family History  Problem Relation Age of Onset  . Cancer Mother        Patient is not sure which type of cancer  . Leukemia Father     Social History Social History  Tobacco Use  . Smoking status: Never Smoker  . Smokeless tobacco: Never Used  Substance Use Topics  . Alcohol use: Never    Frequency: Never  . Drug use: Never     Allergies   Patient has no known allergies.   Review of Systems Review of Systems  Reason unable to perform ROS: See HPI as above.     Physical Exam Triage Vital Signs ED Triage Vitals [03/28/19 1510]  Enc Vitals Group     BP 129/88     Pulse Rate 80     Resp 20     Temp 98.4 F (36.9 C)     Temp src      SpO2 94 %     Weight      Height      Head Circumference      Peak Flow      Pain Score 7     Pain Loc      Pain Edu?      Excl. in [place]?    No data found.  Updated Vital Signs BP 129/88 (BP Location: Left Arm)   Pulse 80   Temp 98.4 F (36.9 C)   Resp 20   SpO2 94%   Physical Exam  Constitutional:      General: He is not in acute distress.    Appearance: He is well-developed. He is not ill-appearing, toxic-appearing or diaphoretic.  HENT:     Head: Normocephalic and atraumatic.  Eyes:     Conjunctiva/sclera: Conjunctivae normal.     Pupils: Pupils are equal, round, and reactive to light.  Cardiovascular:     Rate and Rhythm: Normal rate and regular rhythm.     Heart sounds: No murmur. No friction rub. No gallop.   Pulmonary:     Effort: Pulmonary effort is normal. No respiratory distress.     Breath sounds: Normal breath sounds. No stridor. No wheezing, rhonchi or rales.  Musculoskeletal:     Comments: No tenderness to palpation of the back. Diffuse tenderness to palpation of lateral hip/thigh. Full ROM of hip and back. Strength normal and equal bilaterally. Sensation intact and equal bilaterally. Negative straight leg raise.   Neurological:     Mental Status: He is alert and oriented to person, place, and time.      UC Treatments / Results  Labs (all labs ordered are listed, but only abnormal results are displayed) Labs Reviewed - No data to display  EKG None  Radiology No results found.  Procedures Procedures (including critical Mcclain time)  Medications Ordered in UC Medications - No data to display  Initial Impression / Assessment and Plan / UC Course  I have reviewed the triage vital signs and the nursing notes.  Pertinent labs & imaging results that were available during my Mcclain of the patient were reviewed by me and considered in my medical decision making (see chart for details).    Patient recently started on metformin, will avoid steroid at this time. NSAIDs for possible inflammation. Ice compress, rest. Return precautions given. Patient expresses understanding and agrees to plan.  Final Clinical Impressions(s) / UC Diagnoses   Final diagnoses:  Left hip pain   ED Prescriptions    Medication Sig Dispense Auth. Provider   meloxicam  (MOBIC) 7.5 MG tablet Take 1 tablet (7.5 mg total) by mouth daily. 15 tablet Tobin Chad, [place] 03/28/19 [phone]

## 2019-05-08 ENCOUNTER — Other Ambulatory Visit: Payer: Self-pay

## 2019-05-08 ENCOUNTER — Ambulatory Visit (INDEPENDENT_AMBULATORY_CARE_PROVIDER_SITE_OTHER): Payer: 59 | Admitting: Family Medicine

## 2019-05-08 ENCOUNTER — Telehealth: Payer: Self-pay | Admitting: Family Medicine

## 2019-05-08 DIAGNOSIS — G4730 Sleep apnea, unspecified: Secondary | ICD-10-CM | POA: Diagnosis not present

## 2019-05-08 DIAGNOSIS — Z13220 Encounter for screening for lipoid disorders: Secondary | ICD-10-CM

## 2019-05-08 DIAGNOSIS — M545 Low back pain, unspecified: Secondary | ICD-10-CM

## 2019-05-08 DIAGNOSIS — M25552 Pain in left hip: Secondary | ICD-10-CM

## 2019-05-08 DIAGNOSIS — E1159 Type 2 diabetes mellitus with other circulatory complications: Secondary | ICD-10-CM | POA: Diagnosis not present

## 2019-05-08 DIAGNOSIS — I509 Heart failure, unspecified: Secondary | ICD-10-CM

## 2019-05-08 DIAGNOSIS — I10 Essential (primary) hypertension: Secondary | ICD-10-CM

## 2019-05-08 MED ORDER — ASPIRIN 81 MG PO TBEC: 81.0000 mg | DELAYED_RELEASE_TABLET | Freq: Every day | ORAL | Status: DC

## 2019-05-08 MED ORDER — TORSEMIDE 20 MG PO TABS: 20.0000 mg | ORAL_TABLET | Freq: Every day | ORAL | 3 refills | Status: DC

## 2019-05-08 MED ORDER — METFORMIN HCL 500 MG PO TABS: 500.0000 mg | ORAL_TABLET | Freq: Two times a day (BID) | ORAL | 3 refills | Status: DC

## 2019-05-08 MED ORDER — CARVEDILOL 12.5 MG PO TABS: 12.5000 mg | ORAL_TABLET | Freq: Two times a day (BID) | ORAL | 3 refills | Status: DC

## 2019-05-08 MED ORDER — MELOXICAM 15 MG PO TABS: 15.0000 mg | ORAL_TABLET | Freq: Every day | ORAL | 0 refills | Status: DC

## 2019-05-08 NOTE — Progress Notes (Signed)
Virtual Visit via Telephone Note  I connected with Edwin Mcclain on 05/08/19 at  9:10 AM EDT by telephone and verified that I am speaking with the correct person using two identifiers.  Location: Patient: Located at home during today's encounter  Provider: Located at primary care office     I discussed the limitations, risks, security and privacy concerns of performing an evaluation and management service by telephone and the availability of in person appointments. I also discussed with the patient that there may be a patient responsible charge related to this service. The patient expressed understanding and agreed to proceed.   History of Present Illness: Type 2 Diabetes, Hypertension, CHF Patient was seen in office back in March newly diagnosed as a type II diabetic.  His A1c was 7.0.  He is not personally checking his blood sugar at home however sees the occupational nurse at his place of employment twice weekly for glucose checks and blood pressure checks.  He does not have any of his readings available.  He is also being weighed at work and his last weight as of last week was 340 pounds.  He denies any swelling, shortness of breath, dizziness, or headaches.  He was referred to the heart failure clinic back in March however has not followed up with cardiology as he reports waiting for cardiology to schedule his appointment.  Left hip pain Problem originally began in January. He initially ignored the problem. He has been seen at urgent care for this problem twice, 03/21/19 and 03/28/19. Steroid injection never improved pain. Pain improves with walking and sitting. With standing pain is exacerbated. He was diagnosed with trochanter bursitis back in March. He is currently icy hot with mild relief.  Reports no previous injury or issues with left hip or thigh.  Right side low back pain  Denies injury. Right lower back pain began over 1 month ago. Pain is gradually worsening. Patient is obese and  current occupation requires him to stand on his feet for 12 hours per shift. He has taken Meloxicam with minimal relief of pain. Pain improves with rest and icy hot applications.   Assessment and Plan: 1. Pain of left hip joint -Continue Icy Hot and meloxicam. - Ambulatory referral to Orthopedic Surgery  2. Screening, lipid - Lipid panel; Future  3. Essential hypertension -No medication changes. -Face follow-up in 3 weeks.  4. Sleep apnea, unspecified type ? Patient may need follow-up sleep study. Discuss at follow-up.  5. Acute heart failure, unspecified heart failure type (HCC) -Will follow-up on cardiology referral.  Patient advised if he has not received an appointment within 1 week to please call our office back.  Patient is a new heart failure patient and will need follow-up in the heart care heart failure clinic.  6. Type 2 diabetes mellitus with other circulatory complication, without long-term current use of insulin (HCC) -A1c of 7.0, 03/07/19.  Patient will return to the office in 3 weeks for repeat A1c and CMP.  No medication changes today - Comprehensive metabolic panel; Future - Hemoglobin A1c; Future  7. Acute right-sided low back pain without sciatica - Ambulatory referral to Orthopedic Surgery  Follow Up Instructions: Patient to return to office in 3 weeks for face-to-face encounter for evaluation of type 2 diabetes with repeat A1c and hypertension.   I discussed the assessment and treatment plan with the patient. The patient was provided an opportunity to ask questions and all were answered. The patient agreed with the plan and demonstrated  an understanding of the instructions.   The patient was advised to call back or seek an in-person evaluation if the symptoms worsen or if the condition fails to improve as anticipated.  I provided 25 minutes of non-face-to-face time during this encounter.   Joaquin CourtsKimberly Stanly Si, FNP

## 2019-05-08 NOTE — Progress Notes (Signed)
Called patient to initiate their telephone visit with provider Joaquin Courts, FNP-C. Verified date of birth. States that his blood sugar & BP are checked at the health center at his job & have been running fine. Marland Kitchen KWalker, CMA.

## 2019-05-08 NOTE — Telephone Encounter (Signed)
Please call [facility] Care at [phone] regarding patient being scheduled for an appointment. Patient dx with new CHF in February and has had no follow-up with cardiology. He reports someone called him from cardiology to reschedule due to COVID but he has not had a return call to schedule follow-up. He is available to be scheduled for an appointment Monday-Wednesday every week.

## 2019-05-08 NOTE — Telephone Encounter (Signed)
FYI  Called [facility] clinic & spoke with Dawn who states that referrals to [facility] clinic are accepted via fax or by staff message to their lead nurse Meredith Staggers.  Sent staff message & faxed referral as well.

## 2019-05-26 ENCOUNTER — Telehealth: Payer: Self-pay

## 2019-05-26 NOTE — Telephone Encounter (Signed)
Called patient to do their pre-visit COVID screening.  Have you recently traveled internationally([place], [place], [place], [place], [place]) or within the [place] to a hotspot area([place], [place], [place], [place], [place])? no  Are you currently experiencing any of the following: fever, cough, SHOB, fatigue, body aches, loss of smell, rash, diarrhea, vomiting, severe headaches, weakness, sore throat? no  Have you been in contact with anyone who has recently travelled? no  Have you been in contact with anyone who is experiencing any of the above symptoms or been diagnosed with COVID  or works in or has recently visited a SNF? No  Reminded patient to come in fasting for labwork.  

## 2019-05-29 ENCOUNTER — Other Ambulatory Visit: Payer: Self-pay

## 2019-05-29 ENCOUNTER — Encounter: Payer: Self-pay | Admitting: Family Medicine

## 2019-05-29 ENCOUNTER — Ambulatory Visit (INDEPENDENT_AMBULATORY_CARE_PROVIDER_SITE_OTHER): Payer: 59 | Admitting: Family Medicine

## 2019-05-29 VITALS — BP 146/80 | HR 90 | Temp 98.2°F | Resp 18 | Ht 70.0 in | Wt 390.8 lb

## 2019-05-29 DIAGNOSIS — E1159 Type 2 diabetes mellitus with other circulatory complications: Secondary | ICD-10-CM

## 2019-05-29 DIAGNOSIS — Z23 Encounter for immunization: Secondary | ICD-10-CM

## 2019-05-29 DIAGNOSIS — I10 Essential (primary) hypertension: Secondary | ICD-10-CM | POA: Diagnosis not present

## 2019-05-29 DIAGNOSIS — Z13220 Encounter for screening for lipoid disorders: Secondary | ICD-10-CM

## 2019-05-29 DIAGNOSIS — Z6841 Body Mass Index (BMI) 40.0 and over, adult: Secondary | ICD-10-CM

## 2019-05-29 DIAGNOSIS — I509 Heart failure, unspecified: Secondary | ICD-10-CM

## 2019-05-29 MED ORDER — LOSARTAN POTASSIUM 50 MG PO TABS: 50.0000 mg | ORAL_TABLET | Freq: Every day | ORAL | 2 refills | Status: DC

## 2019-05-29 MED ORDER — MELATONIN 5 MG PO TABS: 5.0000 mg | ORAL_TABLET | Freq: Every evening | ORAL | 2 refills | Status: DC | PRN

## 2019-05-29 MED ORDER — ATORVASTATIN CALCIUM 10 MG PO TABS: 10.0000 mg | ORAL_TABLET | Freq: Every day | ORAL | 3 refills | Status: DC

## 2019-05-29 NOTE — Progress Notes (Signed)
Patient ID: Edwin Mcclain, male    DOB: 1965-12-26, 53 y.o.   MRN: [id number]  PCP: Scot Jun, FNP  Chief Complaint  Patient presents with  . Diabetes  . Hypertension    Subjective:  HPI  Edwin Mcclain is a 53 y.o. male presents for diabetes and hypertension follow-up.  Edwin Mcclain has HTN (hypertension); Acute CHF (congestive heart failure) ([place]); Acute respiratory failure with hypoxia ([place]); and Morbid obesity ([place]) on their problem list.   Hypertension and Diabetes.   Patient is not checking blood sugar or blood pressure at home.  He works for [employer] reports he occasionally is able to get his blood pressure and blood sugar checked by the nurse however has not recently been able to visit the nurse's office. He is currently working 12 hours a day for 5 days/week.  He also suffers from heart failure and has not followed up with the heart care clinic.  In review of EMR and referral heart failure clinic has made a couple of attempts to schedule appointment for patient however patient reports he has not received messages.  He is currently prescribed carvedilol and furosemide for management of heart failure symptoms.  He has gained 20 pounds since his last visit in March. Body mass index is 56.07 kg/m. He endorses eating much more and eating late at night due to long work hours.  He continues to be sedentary except for while at work.  He denies any shortness of breath at rest although endorses mild shortness of breath with excessive activity.  Reports no added salt although is not eating a sodium restricted diet.  He is newly diagnosed with diabetes and is currently managed with metformin only.  He was prescribed a glucometer although is not checking his blood sugar at home. Social History   Socioeconomic History  . Marital status: Single    Spouse name: Not on file  . Number of children: Not on file  . Years of education: Not on file  . Highest education level: Not on  file  Occupational History  . Not on file  Social Needs  . Financial resource strain: Not on file  . Food insecurity:    Worry: Not on file    Inability: Not on file  . Transportation needs:    Medical: Not on file    Non-medical: Not on file  Tobacco Use  . Smoking status: Never Smoker  . Smokeless tobacco: Never Used  Substance and Sexual Activity  . Alcohol use: Never    Frequency: Never  . Drug use: Never  . Sexual activity: Not on file  Lifestyle  . Physical activity:    Days per week: Not on file    Minutes per session: Not on file  . Stress: Not on file  Relationships  . Social connections:    Talks on phone: Not on file    Gets together: Not on file    Attends religious service: Not on file    Active member of club or organization: Not on file    Attends meetings of clubs or organizations: Not on file    Relationship status: Not on file  . Intimate partner violence:    Fear of current or ex partner: Not on file    Emotionally abused: Not on file    Physically abused: Not on file    Forced sexual activity: Not on file  Other Topics Concern  . Not on file  Social History Narrative  .  Not on file    Family History  Problem Relation Age of Onset  . Cancer Mother        Patient is not sure which type of cancer  . Leukemia Father      Review of Systems Pertinent negatives listed in HPI  Prior to Admission medications   Medication Sig Start Date End Date Taking? Authorizing Provider  aspirin 81 MG EC tablet Take 1 tablet (81 mg total) by mouth daily. 05/08/19  Yes Scot Jun, FNP  carvedilol (COREG) 12.5 MG tablet Take 1 tablet (12.5 mg total) by mouth 2 (two) times daily with a meal. 05/08/19  Yes Scot Jun, FNP  meloxicam (MOBIC) 15 MG tablet Take 1 tablet (15 mg total) by mouth daily. 05/08/19  Yes Scot Jun, FNP  metFORMIN (GLUCOPHAGE) 500 MG tablet Take 1 tablet (500 mg total) by mouth 2 (two) times daily with a meal. 05/08/19   Yes Scot Jun, FNP  torsemide (DEMADEX) 20 MG tablet Take 1 tablet (20 mg total) by mouth daily. 05/08/19  Yes Scot Jun, FNP  blood glucose meter kit and supplies Dispense based on patient and insurance preference. Check sugar once daily or prn. (FOR ICD-10 E10.9, E11.9). Patient not taking: Reported on 05/08/2019 03/10/19   Scot Jun, FNP    Past Medical, Surgical Family and Social History reviewed and updated.    Objective:   Today's Vitals   05/29/19 0942  BP: (!) 146/80  Pulse: 90  Resp: 18  Temp: 98.2 F (36.8 C)  TempSrc: Temporal  SpO2: 94%  Weight: (!) 390 lb 12.8 oz (177.3 kg)  Height: _0  (1.778 m)    BP Readings from Last 3 Encounters:  05/29/19 (!) 146/80  03/28/19 129/88  03/21/19 137/85    Filed Weights   05/29/19 0942  Weight: (!) 390 lb 12.8 oz (177.3 kg)       Physical Exam General appearance: alert, well developed, well nourished, cooperative and in no distress Head: Normocephalic, without obvious abnormality, atraumatic Respiratory: Respirations even and unlabored, normal respiratory rate Heart: rate and rhythm normal. No gallop or murmurs noted on exam  Abdomen: BS +, no distention, no rebound tenderness, or no mass Extremities: No gross deformities Skin: Skin color, texture, turgor normal. No rashes seen  Psych: Appropriate mood and affect. Neurologic: Mental status: Alert, oriented to person, place, and time, thought content appropriate. Lab Results  Component Value Date   HGBA1C 7.0 (H) 03/07/2019       Assessment & Plan:  1. Essential hypertension, poorly controlled. Blood pressures not currently monitored consistently at home therefore uncertain of control within the home setting. -Given diabetes will add losartan for blood pressure management and renal protective benefits. Encourage patient to have employee health at his workplace check blood pressure at least 2-3 times per week and record readings.  He was  advised to bring blood pressure readings to his next appointment.  He will also continue carvedilol as prescribed.  2. Type 2 diabetes mellitus with other circulatory complication, without long-term current use of insulin ([place]), new diagnosis.  Last A1c 7.0.  Reports compliance with metformin although uncertain of glycemic control within the home setting.  Patient is not checking blood sugars at home.  We will repeat an A1c and a CMP today.  Continue metformin at current dose. -Start atorvastatin 10 mg per ADA guidelines - Hemoglobin A1c - Comprehensive metabolic panel  3. Chronic heart failure, unspecified heart failure type ([place]) -  Patient hospitalized with new acute onset heart failure in February and has failed to follow-up with cardiology.  I will place a new referral today to the heart failure clinic in efforts to get patient established with cardiology for management of CHF.  Patient is asymptomatic today.  4. Morbid obesity ([place]) Encouraged efforts to reduce weight include engaging in physical activity as tolerated with goal of 150 minutes per week. Improve dietary choices and eat a meal regimen consistent with a Mediterranean or DASH diet. Reduce simple carbohydrates. Do not skip meals and eat healthy snacks throughout the day to avoid over-eating at dinner. Set a goal weight loss that is achievable for you.    5. Need for diphtheria-tetanus-pertussis (Tdap) vaccine -Tdap given  6. Screening, lipid - Lipid panel     Molli Barrows, FNP Primary Care at [facility] [address], [place] [phone]: [phone]

## 2019-05-29 NOTE — Patient Instructions (Addendum)
*BLOOD PRESSURE GOAL LESS THAN 130/90*    DASH Eating Plan DASH stands for "Dietary Approaches to Stop Hypertension." The DASH eating plan is a healthy eating plan that has been shown to reduce high blood pressure (hypertension). It may also reduce your risk for type 2 diabetes, heart disease, and stroke. The DASH eating plan may also help with weight loss. What are tips for following this plan?  General guidelines  Avoid eating more than 2,300 mg (milligrams) of salt (sodium) a day. If you have hypertension, you may need to reduce your sodium intake to 1,500 mg a day.  Limit alcohol intake to no more than 1 drink a day for nonpregnant women and 2 drinks a day for men. One drink equals 12 oz of beer, 5 oz of wine, or 1 oz of hard liquor.  Work with your health care provider to maintain a healthy body weight or to lose weight. Ask what an ideal weight is for you.  Get at least 30 minutes of exercise that causes your heart to beat faster (aerobic exercise) most days of the week. Activities may include walking, swimming, or biking.  Work with your health care provider or diet and nutrition specialist (dietitian) to adjust your eating plan to your individual calorie needs. Reading food labels   Check food labels for the amount of sodium per serving. Choose foods with less than 5 percent of the Daily Value of sodium. Generally, foods with less than 300 mg of sodium per serving fit into this eating plan.  To find whole grains, look for the word "whole" as the first word in the ingredient list. Shopping  Buy products labeled as "low-sodium" or "no salt added."  Buy fresh foods. Avoid canned foods and premade or frozen meals. Cooking  Avoid adding salt when cooking. Use salt-free seasonings or herbs instead of table salt or sea salt. Check with your health care provider or pharmacist before using salt substitutes.  Do not fry foods. Cook foods using healthy methods such as baking, boiling,  grilling, and broiling instead.  Cook with heart-healthy oils, such as olive, canola, soybean, or sunflower oil. Meal planning  Eat a balanced diet that includes: ? 5 or more servings of fruits and vegetables each day. At each meal, try to fill half of your plate with fruits and vegetables. ? Up to 6-8 servings of whole grains each day. ? Less than 6 oz of lean meat, poultry, or fish each day. A 3-oz serving of meat is about the same size as a deck of cards. One egg equals 1 oz. ? 2 servings of low-fat dairy each day. ? A serving of nuts, seeds, or beans 5 times each week. ? Heart-healthy fats. Healthy fats called Omega-3 fatty acids are found in foods such as flaxseeds and coldwater fish, like sardines, salmon, and mackerel.  Limit how much you eat of the following: ? Canned or prepackaged foods. ? Food that is high in trans fat, such as fried foods. ? Food that is high in saturated fat, such as fatty meat. ? Sweets, desserts, sugary drinks, and other foods with added sugar. ? Full-fat dairy products.  Do not salt foods before eating.  Try to eat at least 2 vegetarian meals each week.  Eat more home-cooked food and less restaurant, buffet, and fast food.  When eating at a restaurant, ask that your food be prepared with less salt or no salt, if possible. What foods are recommended? The items listed may not  be a complete list. Talk with your dietitian about what dietary choices are best for you. Grains Whole-grain or whole-wheat bread. Whole-grain or whole-wheat pasta. Brown rice. [place]. Bulgur. Whole-grain and low-sodium cereals. Pita bread. Low-fat, low-sodium crackers. Whole-wheat flour tortillas. Vegetables Fresh or frozen vegetables (raw, steamed, roasted, or grilled). Low-sodium or reduced-sodium tomato and vegetable juice. Low-sodium or reduced-sodium tomato sauce and tomato paste. Low-sodium or reduced-sodium canned vegetables. Fruits All fresh, dried, or frozen  fruit. Canned fruit in natural juice (without added sugar). Meat and other protein foods Skinless chicken or [place]. Ground chicken or [place]. Pork with fat trimmed off. Fish and seafood. Egg whites. Dried beans, peas, or lentils. Unsalted nuts, nut butters, and seeds. Unsalted canned beans. Lean cuts of beef with fat trimmed off. Low-sodium, lean deli meat. Dairy Low-fat (1%) or fat-free (skim) milk. Fat-free, low-fat, or reduced-fat cheeses. Nonfat, low-sodium ricotta or cottage cheese. Low-fat or nonfat yogurt. Low-fat, low-sodium cheese. Fats and oils Soft margarine without trans fats. Vegetable oil. Low-fat, reduced-fat, or light mayonnaise and salad dressings (reduced-sodium). Canola, safflower, olive, soybean, and sunflower oils. Avocado. Seasoning and other foods Herbs. Spices. Seasoning mixes without salt. Unsalted popcorn and pretzels. Fat-free sweets. What foods are not recommended? The items listed may not be a complete list. Talk with your dietitian about what dietary choices are best for you. Grains Baked goods made with fat, such as croissants, muffins, or some breads. Dry pasta or rice meal packs. Vegetables Creamed or fried vegetables. Vegetables in a cheese sauce. Regular canned vegetables (not low-sodium or reduced-sodium). Regular canned tomato sauce and paste (not low-sodium or reduced-sodium). Regular tomato and vegetable juice (not low-sodium or reduced-sodium). Angie Fava. Olives. Fruits Canned fruit in a light or heavy syrup. Fried fruit. Fruit in cream or butter sauce. Meat and other protein foods Fatty cuts of meat. Ribs. Fried meat. Berniece Salines. Sausage. Bologna and other processed lunch meats. Salami. Fatback. Hotdogs. Bratwurst. Salted nuts and seeds. Canned beans with added salt. Canned or smoked fish. Whole eggs or egg yolks. Chicken or [place] with skin. Dairy Whole or 2% milk, cream, and half-and-half. Whole or full-fat cream cheese. Whole-fat or sweetened yogurt. Full-fat  cheese. Nondairy creamers. Whipped toppings. Processed cheese and cheese spreads. Fats and oils Butter. Stick margarine. Lard. Shortening. Ghee. Bacon fat. Tropical oils, such as coconut, palm kernel, or palm oil. Seasoning and other foods Salted popcorn and pretzels. Onion salt, garlic salt, seasoned salt, table salt, and sea salt. Worcestershire sauce. Tartar sauce. Barbecue sauce. Teriyaki sauce. Soy sauce, including reduced-sodium. Steak sauce. Canned and packaged gravies. Fish sauce. Oyster sauce. Cocktail sauce. Horseradish that you find on the shelf. Ketchup. Mustard. Meat flavorings and tenderizers. Bouillon cubes. Hot sauce and Tabasco sauce. Premade or packaged marinades. Premade or packaged taco seasonings. Relishes. Regular salad dressings. Where to find more information:  National Heart, Lung, and [place]: https://wilson-eaton.com/  American Heart Association: www.heart.org Summary  The DASH eating plan is a healthy eating plan that has been shown to reduce high blood pressure (hypertension). It may also reduce your risk for type 2 diabetes, heart disease, and stroke.  With the DASH eating plan, you should limit salt (sodium) intake to 2,300 mg a day. If you have hypertension, you may need to reduce your sodium intake to 1,500 mg a day.  When on the DASH eating plan, aim to eat more fresh fruits and vegetables, whole grains, lean proteins, low-fat dairy, and heart-healthy fats.  Work with your health care provider or diet and nutrition  specialist (dietitian) to adjust your eating plan to your individual calorie needs. This information is not intended to replace advice given to you by your health care provider. Make sure you discuss any questions you have with your health care provider. Document Released: 11/26/2011 Document Revised: 11/30/2016 Document Reviewed: 11/30/2016 Elsevier Interactive Patient Education  2019 Elsevier Inc.    Insomnia Insomnia is a sleep disorder that  makes it difficult to fall asleep or stay asleep. Insomnia can cause fatigue, low energy, difficulty concentrating, mood swings, and poor performance at work or school. There are three different ways to classify insomnia:  Difficulty falling asleep.  Difficulty staying asleep.  Waking up too early in the morning. Any type of insomnia can be long-term (chronic) or short-term (acute). Both are common. Short-term insomnia usually lasts for three months or less. Chronic insomnia occurs at least three times a week for longer than three months. What are the causes? Insomnia may be caused by another condition, situation, or substance, such as:  Anxiety.  Certain medicines.  Gastroesophageal reflux disease (GERD) or other gastrointestinal conditions.  Asthma or other breathing conditions.  Restless legs syndrome, sleep apnea, or other sleep disorders.  Chronic pain.  Menopause.  Stroke.  Abuse of alcohol, tobacco, or illegal drugs.  Mental health conditions, such as depression.  Caffeine.  Neurological disorders, such as Alzheimer's disease.  An overactive thyroid (hyperthyroidism). Sometimes, the cause of insomnia may not be known. What increases the risk? Risk factors for insomnia include:  Gender. Women are affected more often than men.  Age. Insomnia is more common as you get older.  Stress.  Lack of exercise.  Irregular work schedule or working night shifts.  Traveling between different time zones.  Certain medical and mental health conditions. What are the signs or symptoms? If you have insomnia, the main symptom is having trouble falling asleep or having trouble staying asleep. This may lead to other symptoms, such as:  Feeling fatigued or having low energy.  Feeling nervous about going to sleep.  Not feeling rested in the morning.  Having trouble concentrating.  Feeling irritable, anxious, or depressed. How is this diagnosed? This condition may be  diagnosed based on:  Your symptoms and medical history. Your health care provider may ask about: ? Your sleep habits. ? Any medical conditions you have. ? Your mental health.  A physical exam. How is this treated? Treatment for insomnia depends on the cause. Treatment may focus on treating an underlying condition that is causing insomnia. Treatment may also include:  Medicines to help you sleep.  Counseling or therapy.  Lifestyle adjustments to help you sleep better. Follow these instructions at home: Eating and drinking   Limit or avoid alcohol, caffeinated beverages, and cigarettes, especially close to bedtime. These can disrupt your sleep.  Do not eat a large meal or eat spicy foods right before bedtime. This can lead to digestive discomfort that can make it hard for you to sleep. Sleep habits   Keep a sleep diary to help you and your health care provider figure out what could be causing your insomnia. Write down: ? When you sleep. ? When you wake up during the night. ? How well you sleep. ? How rested you feel the next day. ? Any side effects of medicines you are taking. ? What you eat and drink.  Make your bedroom a dark, comfortable place where it is easy to fall asleep. ? Put up shades or blackout curtains to block light from  outside. ? Use a [place] noise machine to block noise. ? Keep the temperature cool.  Limit screen use before bedtime. This includes: ? Watching TV. ? Using your smartphone, tablet, or computer.  Stick to a routine that includes going to bed and waking up at the same times every day and night. This can help you fall asleep faster. Consider making a quiet activity, such as reading, part of your nighttime routine.  Try to avoid taking naps during the day so that you sleep better at night.  Get out of bed if you are still awake after 15 minutes of trying to sleep. Keep the lights down, but try reading or doing a quiet activity. When you feel  sleepy, go back to bed. General instructions  Take over-the-counter and prescription medicines only as told by your health care provider.  Exercise regularly, as told by your health care provider. Avoid exercise starting several hours before bedtime.  Use relaxation techniques to manage stress. Ask your health care provider to suggest some techniques that may work well for you. These may include: ? Breathing exercises. ? Routines to release muscle tension. ? Visualizing peaceful scenes.  Make sure that you drive carefully. Avoid driving if you feel very sleepy.  Keep all follow-up visits as told by your health care provider. This is important. Contact a health care provider if:  You are tired throughout the day.  You have trouble in your daily routine due to sleepiness.  You continue to have sleep problems, or your sleep problems get worse. Get help right away if:  You have serious thoughts about hurting yourself or someone else. If you ever feel like you may hurt yourself or others, or have thoughts about taking your own life, get help right away. You can go to your nearest emergency department or call:  Your local emergency services (911 in the [place]  A suicide crisis helpline, such as the [place] at 430-032-8151. This is open 24 hours a day. Summary  Insomnia is a sleep disorder that makes it difficult to fall asleep or stay asleep.  Insomnia can be long-term (chronic) or short-term (acute).  Treatment for insomnia depends on the cause. Treatment may focus on treating an underlying condition that is causing insomnia.  Keep a sleep diary to help you and your health care provider figure out what could be causing your insomnia. This information is not intended to replace advice given to you by your health care provider. Make sure you discuss any questions you have with your health care provider. Document Released: 12/04/2000 Document Revised:  09/16/2017 Document Reviewed: 09/16/2017 Elsevier Interactive Patient Education  2019 [employer].

## 2019-05-30 ENCOUNTER — Ambulatory Visit: Payer: 59 | Admitting: Orthopaedic Surgery

## 2019-05-30 ENCOUNTER — Telehealth: Payer: Self-pay | Admitting: Family Medicine

## 2019-05-30 LAB — HEMOGLOBIN A1C
Est. average glucose Bld gHb Est-mCnc: 140 mg/dL
Hgb A1c MFr Bld: 6.5 % — ABNORMAL HIGH (ref 4.8–5.6)

## 2019-05-30 LAB — COMPREHENSIVE METABOLIC PANEL
ALT: 14 IU/L (ref 0–44)
AST: 17 IU/L (ref 0–40)
Albumin/Globulin Ratio: 1.4 (ref 1.2–2.2)
Albumin: 4 g/dL (ref 3.8–4.9)
Alkaline Phosphatase: 83 IU/L (ref 39–117)
BUN/Creatinine Ratio: 12 (ref 9–20)
BUN: 11 mg/dL (ref 6–24)
Bilirubin Total: 0.3 mg/dL (ref 0.0–1.2)
CO2: 29 mmol/L (ref 20–29)
Calcium: 8.7 mg/dL (ref 8.7–10.2)
Chloride: 103 mmol/L (ref 96–106)
Creatinine, Ser: 0.89 mg/dL (ref 0.76–1.27)
GFR calc Af Amer: 114 mL/min/{1.73_m2} (ref >59)
GFR calc non Af Amer: 99 mL/min/{1.73_m2} (ref >59)
Globulin, Total: 2.8 g/dL (ref 1.5–4.5)
Glucose: 115 mg/dL — ABNORMAL HIGH (ref 65–99)
Potassium: 4.3 mmol/L (ref 3.5–5.2)
Sodium: 142 mmol/L (ref 134–144)
Total Protein: 6.8 g/dL (ref 6.0–8.5)

## 2019-05-30 LAB — LIPID PANEL
Chol/HDL Ratio: 3.8 ratio (ref 0.0–5.0)
Cholesterol, Total: 165 mg/dL (ref 100–199)
HDL: 44 mg/dL (ref >39)
LDL Calculated: 98 mg/dL (ref 0–99)
Triglycerides: 113 mg/dL (ref 0–149)
VLDL Cholesterol Cal: 23 mg/dL (ref 5–40)

## 2019-05-30 NOTE — Telephone Encounter (Signed)
Informed patient that the Metformin he is requesting should not be out due to provider giving him refills 05-08-2019 with 3 refills. Staff listed the entire list of patient medications to him and when they where filled by provider and how many refills was sent. Informed patient that he need to reach out to the pharmacy and ask for the prescribtions he refills for. Patient agreed and stated he will reach out to pharmacy.

## 2019-05-30 NOTE — Telephone Encounter (Signed)
LMOM

## 2019-05-30 NOTE — Telephone Encounter (Signed)
Caller Name: Jaques Mineer  Reason for Call:  Medication refill request for metFORMIN (GLUCOPHAGE) 500 MG tablet [[id number]   If this is a medication request: confirm pharmacy  [facility], [place] - [address] AT [place] call back number:    Action taken by recipient of request:

## 2019-05-31 ENCOUNTER — Telehealth: Payer: Self-pay | Admitting: Family Medicine

## 2019-05-31 ENCOUNTER — Ambulatory Visit: Payer: 59 | Admitting: Orthopaedic Surgery

## 2019-05-31 NOTE — Telephone Encounter (Signed)
Patient called stated he was returning call from nurse for his lab results, please follow up.

## 2019-05-31 NOTE — Progress Notes (Signed)
Patient notified of results & recommendations. Expressed understanding.

## 2019-06-07 ENCOUNTER — Other Ambulatory Visit: Payer: Self-pay

## 2019-06-07 ENCOUNTER — Telehealth: Payer: Self-pay | Admitting: Family Medicine

## 2019-06-07 ENCOUNTER — Telehealth: Payer: Self-pay | Admitting: *Deleted

## 2019-06-07 ENCOUNTER — Ambulatory Visit (INDEPENDENT_AMBULATORY_CARE_PROVIDER_SITE_OTHER): Payer: 59 | Admitting: Family Medicine

## 2019-06-07 DIAGNOSIS — R05 Cough: Secondary | ICD-10-CM | POA: Diagnosis not present

## 2019-06-07 DIAGNOSIS — Z20822 Contact with and (suspected) exposure to covid-19: Secondary | ICD-10-CM

## 2019-06-07 DIAGNOSIS — Z20828 Contact with and (suspected) exposure to other viral communicable diseases: Secondary | ICD-10-CM

## 2019-06-07 DIAGNOSIS — I10 Essential (primary) hypertension: Secondary | ICD-10-CM | POA: Diagnosis not present

## 2019-06-07 DIAGNOSIS — Z8679 Personal history of other diseases of the circulatory system: Secondary | ICD-10-CM

## 2019-06-07 DIAGNOSIS — E119 Type 2 diabetes mellitus without complications: Secondary | ICD-10-CM

## 2019-06-07 NOTE — Progress Notes (Signed)
Virtual Visit via Telephone Note  I connected with Edwin Mcclain on 06/07/19 at  1:50 PM EDT by telephone and verified that I am speaking with the correct person using two identifiers.  Location: Patient: Located at home during today's encounter  Provider: Located at primary care office    I discussed the limitations, risks, security and privacy concerns of performing an evaluation and management service by telephone and the availability of in person appointments. I also discussed with the patient that there may be a patient responsible charge related to this service. The patient expressed understanding and agreed to proceed.   History of Present Illness: Patient complains of acute onset cough beginning approximately 4 days ago which is gradually worsening.  Cough is nonproductive and occurs regardless of activity.  Patient works at [employer] and was told by a coworker that someone at their office did test positive for COVID.  Patient is high risk as he suffers from type 2 diabetes, heart failure, and hypertension.  He denies any notable changes to shortness of breath and endorses no increased work of breathing.  He denies fever although he is unable to check his temperature.  He has taken Delsym and Alka-Seltzer plus cough and cold without any relief of cough.  He denies any other associated URI symptoms.  He denies any acute onset weight gain or swelling. Assessment and Plan: 1. Suspected Covid-19 Virus Infection Patient referred for COVID testing. Patient advised to continue Delsym as needed for cough and hydrate well. Patient also advised to stay at home and self isolate given symptoms could be could be related.  Follow Up Instructions: Provider will follow-up with patient upon receipt of COVID testing results Patient has been written out of work beginning today through 06/10/2019.  Patient also advised to stay at home and self isolate until COVID test has resulted.   I discussed  the assessment and treatment plan with the patient. The patient was provided an opportunity to ask questions and all were answered. The patient agreed with the plan and demonstrated an understanding of the instructions.   The patient was advised to call back or seek an in-person evaluation if the symptoms worsen or if the condition fails to improve as anticipated.  I provided 20 minutes of non-face-to-face time during this encounter.   Molli Barrows, FNP

## 2019-06-07 NOTE — Telephone Encounter (Signed)
Jeanette Caprice, from [facility] at [facility] calling to request COVID-19 testing for the pt referred by Bobbye Charleston. Pt has COVID symptoms and was notified by employer that an employee tested positive for COVID-19.  [facility]  Fax: [phone]   Pt contacted to be scheduled for testing but pt was driving at the time of call and will call back to schedule appt.

## 2019-06-07 NOTE — Telephone Encounter (Signed)
Please schedule COVID testing through Patient Engagement for patient.

## 2019-06-07 NOTE — Telephone Encounter (Signed)
Pt. Called back and scheduled for tomorrow. 

## 2019-06-07 NOTE — Addendum Note (Signed)
Addended by: Linus Orn A on: 06/07/2019 03:14 PM   Modules accepted: Orders

## 2019-06-07 NOTE — Progress Notes (Deleted)
Called patient to initiate their telephone visit with provider Molli Barrows, FNP-C. Verified date of birth. Patient has c/o nonproductive cough x 3 days. No SHOB, bodyaches, headaches, fever. Has been taking Delsym & Alka Seltzer plus with no relief.Marland Kitchen KWalker, CMA.

## 2019-06-07 NOTE — Telephone Encounter (Signed)
Called patient engagement to give patient's information in order to be scheduled.

## 2019-06-08 ENCOUNTER — Other Ambulatory Visit: Payer: 59

## 2019-06-08 DIAGNOSIS — Z20822 Contact with and (suspected) exposure to covid-19: Secondary | ICD-10-CM

## 2019-06-12 ENCOUNTER — Telehealth: Payer: Self-pay

## 2019-06-12 LAB — NOVEL CORONAVIRUS, NAA: SARS-CoV-2, NAA: NOT DETECTED

## 2019-06-12 NOTE — Telephone Encounter (Signed)
Patient is requesting a note stating that it's ok for him to return to work after recent Covid testing.  Would like note sent to [place] [employer] at [email or web address]

## 2019-06-12 NOTE — Telephone Encounter (Signed)
Letter scanned to [employer] & a copy put up front for patient to pick up.

## 2019-06-12 NOTE — Telephone Encounter (Signed)
Work letter completed

## 2019-06-20 ENCOUNTER — Encounter (HOSPITAL_COMMUNITY): Payer: 59 | Admitting: Cardiology

## 2019-06-28 ENCOUNTER — Other Ambulatory Visit (HOSPITAL_COMMUNITY)
Admission: RE | Admit: 2019-06-28 | Discharge: 2019-06-28 | Disposition: A | Discharge status: Home / Self Care | Payer: 59 | Source: Ambulatory Visit | Attending: Internal Medicine | Admitting: Internal Medicine

## 2019-06-28 DIAGNOSIS — Z01812 Encounter for preprocedural laboratory examination: Secondary | ICD-10-CM | POA: Diagnosis present

## 2019-06-28 DIAGNOSIS — Z1159 Encounter for screening for other viral diseases: Secondary | ICD-10-CM | POA: Diagnosis not present

## 2019-06-28 LAB — SARS CORONAVIRUS 2 (TAT 6-24 HRS): SARS Coronavirus 2: NEGATIVE

## 2019-07-01 ENCOUNTER — Ambulatory Visit (HOSPITAL_BASED_OUTPATIENT_CLINIC_OR_DEPARTMENT_OTHER): Payer: 59

## 2019-07-03 ENCOUNTER — Other Ambulatory Visit (HOSPITAL_COMMUNITY)
Admission: RE | Admit: 2019-07-03 | Discharge: 2019-07-03 | Disposition: A | Discharge status: Home / Self Care | Payer: 59 | Source: Ambulatory Visit | Attending: Internal Medicine | Admitting: Internal Medicine

## 2019-07-03 NOTE — Progress Notes (Signed)
Left message for Edwin Mcclain to inquire his arrival for Covid 19 screening today prior to his procedure on 7/16. Sleep study office updated he did not arrive today.

## 2019-07-06 ENCOUNTER — Encounter (HOSPITAL_BASED_OUTPATIENT_CLINIC_OR_DEPARTMENT_OTHER): Payer: 59

## 2019-07-11 ENCOUNTER — Ambulatory Visit: Payer: 59 | Admitting: Family Medicine

## 2019-07-14 DIAGNOSIS — J9622 Acute and chronic respiratory failure with hypercapnia: Secondary | ICD-10-CM | POA: Insufficient documentation

## 2019-07-14 DIAGNOSIS — J9621 Acute and chronic respiratory failure with hypoxia: Secondary | ICD-10-CM | POA: Insufficient documentation

## 2019-07-17 ENCOUNTER — Telehealth: Payer: Self-pay | Admitting: Family Medicine

## 2019-07-17 NOTE — Telephone Encounter (Signed)
Patient called and informed us that he at [facility] in the hospital has been there since Thursday

## 2019-07-19 MED ORDER — QUINERVA 260 MG PO TABS
650.00 | ORAL_TABLET | ORAL | Status: DC
Start: ? — End: 2019-07-19

## 2019-07-19 MED ORDER — Medication
20.00 | Status: DC
Start: 2019-07-18 — End: 2019-07-19

## 2019-07-19 MED ORDER — DIHYDROTACHYSTEROL 0.125 MG PO TABS
10.00 | ORAL_TABLET | ORAL | Status: DC
Start: 2019-07-18 — End: 2019-07-19

## 2019-07-19 MED ORDER — PHENYLEPH-POT GUAIACOLSULF
81.00 | Status: DC
Start: 2019-07-18 — End: 2019-07-19

## 2019-07-19 MED ORDER — EQ MICONAZOLE 7 VA
875.00 | VAGINAL | Status: DC
Start: 2019-07-17 — End: 2019-07-19

## 2019-07-19 MED ORDER — TROJAN ULTRA TEXTURE LUBRICATE MISC
6.25 | Status: DC
Start: 2019-07-18 — End: 2019-07-19

## 2019-07-19 MED ORDER — GLYCERIN 50 % PO SOLN
ORAL | Status: DC
Start: 2019-07-17 — End: 2019-07-19

## 2019-07-19 MED ORDER — BD ECLIPSE SYRINGE 22G X 1" 3 ML MISC
150.00 | Status: DC
Start: 2019-07-18 — End: 2019-07-19

## 2019-07-19 MED ORDER — Medication
Status: DC
Start: ? — End: 2019-07-19

## 2019-07-19 MED ORDER — OLANZAPINE-FLUOXETINE HCL 6-50 MG PO CAPS
3.00 | ORAL_CAPSULE | ORAL | Status: DC
Start: ? — End: 2019-07-19

## 2019-07-19 MED ORDER — STRI-DEX MAXIMUM STRENGTH 2 % EX PADS
125.00 | MEDICATED_PAD | CUTANEOUS | Status: DC
Start: ? — End: 2019-07-19

## 2019-07-19 MED ORDER — INSULIN LISPRO 100 UNIT/ML ~~LOC~~ SOLN
2.00 | SUBCUTANEOUS | Status: DC
Start: 2019-07-18 — End: 2019-07-19

## 2019-07-19 MED ORDER — FOSPHENYTOIN SODIUM 50 MG PE/ML IJ SOLN
15.00 | INTRAMUSCULAR | Status: DC
Start: ? — End: 2019-07-19

## 2019-07-19 MED ORDER — CVS EAR DROPS OT
40.00 | OTIC | Status: DC
Start: 2019-07-17 — End: 2019-07-19

## 2019-07-25 ENCOUNTER — Ambulatory Visit (INDEPENDENT_AMBULATORY_CARE_PROVIDER_SITE_OTHER): Payer: 59 | Admitting: Internal Medicine

## 2019-07-25 ENCOUNTER — Other Ambulatory Visit: Payer: Self-pay

## 2019-07-25 ENCOUNTER — Encounter: Payer: Self-pay | Admitting: Internal Medicine

## 2019-07-25 DIAGNOSIS — Z09 Encounter for follow-up examination after completed treatment for conditions other than malignant neoplasm: Secondary | ICD-10-CM

## 2019-07-25 DIAGNOSIS — J9601 Acute respiratory failure with hypoxia: Secondary | ICD-10-CM

## 2019-07-25 DIAGNOSIS — E119 Type 2 diabetes mellitus without complications: Secondary | ICD-10-CM

## 2019-07-25 DIAGNOSIS — I5032 Chronic diastolic (congestive) heart failure: Secondary | ICD-10-CM

## 2019-07-25 DIAGNOSIS — R0683 Snoring: Secondary | ICD-10-CM

## 2019-07-25 DIAGNOSIS — D509 Iron deficiency anemia, unspecified: Secondary | ICD-10-CM

## 2019-07-25 MED ORDER — TORSEMIDE 20 MG PO TABS: 20.0000 mg | ORAL_TABLET | Freq: Two times a day (BID) | ORAL | 3 refills | Status: DC

## 2019-07-25 NOTE — Progress Notes (Signed)
Virtual Visit via Telephone Note Due to current restrictions/limitations of in-office visits due to the COVID-19 pandemic, this scheduled clinical appointment was converted to a telehealth visit  I connected with Edwin Mcclain on 07/25/19 at 2:24 p.m by telephone and verified that I am speaking with the correct person using two identifiers. I am in my office.  The patient is at home.  Only the patient and myself participated in this encounter.  I discussed the limitations, risks, security and privacy concerns of performing an evaluation and management service by telephone and the availability of in person appointments. I also discussed with the patient that there may be a patient responsible charge related to this service. The patient expressed understanding and agreed to proceed.   History of Present Illness: Patient with history of HTN, HL, CHF (echo 01/2019 60 to 65%), morbid obesity, DM   Pt recently hosp at [facility] from 7/23-27/2020 with acute decompensated CHF.  Pt was on BiPAP in MICU. Pt diuresed appropriately. Losartan placed on hold until he f/u with PCP and Torsemide increased from once a day  to 20 mg BID.  Reports his breathing is better compared to when he had to be hosp.  On home O2 2L since hosp in March to use at nights but now has to use it when he exerts self sometimes.  Does not have portable oxygen.  Admits to loud snoring and daytime sleepiness.  Morning HA before being placed on nocturnal O2 on discharge from the hospital last week, they recommend that he has sleep study. 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  Does a lot of standing and walking.  Wants to know to work. Sent home on Augmentin for infection "on my penis."  He has completed the abx and infection has clear.  Patient was discharged on iron supplement, Nu-iron for microcytic anemia.  Last H&H  was 12/38 with MCV of 73.  I do not see iron studies in the system for him.  He has not had colon cancer screening.  DM:  No device to check to check BS Compliant with Metformin Reports he is doing better with eating habits.  Eating more fruits and salads.  Eats chicken and tuna for lunch and dinner.    Outpatient Encounter Medications as of 07/25/2019  Medication Sig  . aspirin 81 MG EC tablet Take 1 tablet (81 mg total) by mouth daily.  Marland Kitchen atorvastatin (LIPITOR) 10 MG tablet Take 1 tablet (10 mg total) by mouth daily.  . blood glucose meter kit and supplies Dispense based on patient and insurance preference. Check sugar once daily or prn. (FOR ICD-10 E10.9, E11.9).  . carvedilol (COREG) 12.5 MG tablet Take 1 tablet (12.5 mg total) by mouth 2 (two) times daily with a meal.  . iron polysaccharides (NU-IRON) 150 MG capsule Take 1 capsule by mouth daily.  . Melatonin 10 MG TABS Take 1 tablet by mouth at bedtime as needed.  . meloxicam (MOBIC) 15 MG tablet Take 1 tablet (15 mg total) by mouth daily.  . metFORMIN (GLUCOPHAGE) 500 MG tablet Take 1 tablet (500 mg total) by mouth 2 (two) times daily with a meal.  . torsemide (DEMADEX) 20 MG tablet Take 1 tablet (20 mg total) by mouth daily.  Marland Kitchen losartan (COZAAR) 50 MG tablet Take 1 tablet (50 mg total) by mouth daily. (Patient not taking:  Reported on 07/25/2019)  . [DISCONTINUED] Melatonin 5 MG TABS Take 1 tablet (5 mg total) by mouth at bedtime as needed and may repeat dose one time if needed.   No facility-administered encounter medications on file as of 07/25/2019.     Observations/Objective: Lab Results  Component Value Date   WBC 3.7 (L) 02/18/2019   HGB 13.1 02/18/2019   HCT 44.8 02/18/2019   MCV 70.2 (L) 02/18/2019   PLT 223 02/18/2019     Chemistry      Component Value Date/Time   NA 142 05/29/2019 0953   K 4.3 05/29/2019 0953   CL 103 05/29/2019 0953   CO2 29 05/29/2019 0953   BUN 11 05/29/2019 0953   CREATININE 0.89 05/29/2019 0953       Component Value Date/Time   CALCIUM 8.7 05/29/2019 0953   ALKPHOS 83 05/29/2019 0953   AST 17 05/29/2019 0953   ALT 14 05/29/2019 0953   BILITOT 0.3 05/29/2019 0953     Lab Results  Component Value Date   CHOL 165 05/29/2019   HDL 44 05/29/2019   LDLCALC 98 05/29/2019   TRIG 113 05/29/2019   CHOLHDL 3.8 05/29/2019   Lab Results  Component Value Date   HGBA1C 6.5 (H) 05/29/2019     Assessment and Plan: 1. Hospital discharge follow-up 2. Chronic diastolic congestive heart failure ([place]) -Patient to continue current dose of torsemide.  He will come to the lab later this week for BMP -We will do in person visit in 1 week to evaluate blood pressure, check his weight and do an exam.  At that time we can check oxygen at rest and with ambulation to see whether he needs portable O2.  We will hold off sending him back to work until then -Follow-up visit whether we can restart the Cozaar. Low-salt diet advised.  3. Acute respiratory failure with hypoxia (HCC) On home O2.  Will check for need to [place] O2 office visit  4. Loud snoring Refer for sleep study  5. Microcytic anemia Check iron studies.  Discussed colon cancer screening on next visit  6. Controlled type 2 diabetes mellitus without complication, without long-term current use of insulin (HCC) Last A1c at goal.  Continue metformin and healthy eating habits   Follow Up Instructions: 1 wk   I discussed the assessment and treatment plan with the patient. The patient was provided an opportunity to ask questions and all were answered. The patient agreed with the plan and demonstrated an understanding of the instructions.   The patient was advised to call back or seek an in-person evaluation if the symptoms worsen or if the condition fails to improve as anticipated.  I provided 23 minutes of non-face-to-face time during this encounter.   Karle Plumber, MD

## 2019-07-25 NOTE — Progress Notes (Signed)
Patient here for hospital f/up(07/13/2019-07/17/2019) for [facility], cough, acute CHF exacerbation. States that he is still having the [facility] & cough. Was scheduled for new patient appointment with Cardiology on 06/20/2019 but no-showed appointment. Patient given information to call to reschedule appointment. Patient is still out of work. Would like to discuss being released

## 2019-07-27 ENCOUNTER — Other Ambulatory Visit: Payer: Self-pay

## 2019-07-27 ENCOUNTER — Other Ambulatory Visit: Payer: 59

## 2019-07-27 DIAGNOSIS — I5032 Chronic diastolic (congestive) heart failure: Secondary | ICD-10-CM

## 2019-07-27 DIAGNOSIS — D509 Iron deficiency anemia, unspecified: Secondary | ICD-10-CM

## 2019-07-27 NOTE — Progress Notes (Signed)
Patient here for labs to follow up on hospital findings. KWalker, CMA.

## 2019-07-28 LAB — BASIC METABOLIC PANEL
BUN/Creatinine Ratio: 12 (ref 9–20)
BUN: 10 mg/dL (ref 6–24)
CO2: 33 mmol/L — ABNORMAL HIGH (ref 20–29)
Calcium: 9.2 mg/dL (ref 8.7–10.2)
Chloride: 95 mmol/L — ABNORMAL LOW (ref 96–106)
Creatinine, Ser: 0.81 mg/dL (ref 0.76–1.27)
GFR calc Af Amer: 119 mL/min/{1.73_m2} (ref >59)
GFR calc non Af Amer: 103 mL/min/{1.73_m2} (ref >59)
Glucose: 111 mg/dL — ABNORMAL HIGH (ref 65–99)
Potassium: 4.6 mmol/L (ref 3.5–5.2)
Sodium: 142 mmol/L (ref 134–144)

## 2019-07-28 LAB — IRON,TIBC AND FERRITIN PANEL
Ferritin: 38 ng/mL (ref 30–400)
Iron Saturation: 22 % (ref 15–55)
Iron: 74 ug/dL (ref 38–169)
Total Iron Binding Capacity: 338 ug/dL (ref 250–450)
UIBC: 264 ug/dL (ref 111–343)

## 2019-07-31 ENCOUNTER — Telehealth: Payer: Self-pay

## 2019-07-31 NOTE — Telephone Encounter (Signed)
Called patient to do their pre-visit COVID screening.  Have you been tested for COVID or are you currently waiting for COVID test results? no  Have you recently traveled internationally([place], [place], [place], [place], [place]) or within the [place] to a hotspot area([place], [place], [place], [place], [place])? no  Are you currently experiencing any of the following: fever, cough, SHOB, fatigue, body aches, loss of smell, rash, diarrhea, vomiting, severe headaches, weakness, sore throat? SHOB  Have you been in contact with anyone who has recently travelled? no  Have you been in contact with anyone who is experiencing any of the above symptoms or been diagnosed with COVID  or works in or has recently visited a SNF? no  

## 2019-08-01 ENCOUNTER — Ambulatory Visit (INDEPENDENT_AMBULATORY_CARE_PROVIDER_SITE_OTHER): Payer: 59 | Admitting: Internal Medicine

## 2019-08-01 ENCOUNTER — Encounter: Payer: Self-pay | Admitting: Internal Medicine

## 2019-08-01 ENCOUNTER — Other Ambulatory Visit: Payer: Self-pay

## 2019-08-01 VITALS — BP 131/84 | HR 94 | Temp 97.2°F | Resp 18 | Ht 70.0 in | Wt 369.6 lb

## 2019-08-01 DIAGNOSIS — R0683 Snoring: Secondary | ICD-10-CM | POA: Diagnosis not present

## 2019-08-01 DIAGNOSIS — I10 Essential (primary) hypertension: Secondary | ICD-10-CM

## 2019-08-01 DIAGNOSIS — J9611 Chronic respiratory failure with hypoxia: Secondary | ICD-10-CM

## 2019-08-01 DIAGNOSIS — J9601 Acute respiratory failure with hypoxia: Secondary | ICD-10-CM | POA: Diagnosis not present

## 2019-08-01 DIAGNOSIS — I5032 Chronic diastolic (congestive) heart failure: Secondary | ICD-10-CM | POA: Diagnosis not present

## 2019-08-01 MED ORDER — LOSARTAN POTASSIUM 25 MG PO TABS: 25.0000 mg | ORAL_TABLET | Freq: Every day | ORAL | 6 refills | Status: DC

## 2019-08-01 NOTE — Progress Notes (Signed)
Patient ID: Edwin Mcclain, male    DOB: June 26, 1966  MRN: [id number]  CC: Hypertension and Congestive Heart Failure   Subjective: Edwin Mcclain is a 53 y.o. male who presents for 1 wk f/u for CHF/HTN and hypoxic respiratory failure His concerns today include:  Patient with history of HTN, HL, CHF (echo 01/2019 60 to 65%), morbid obesity, DM   I spoke with the patient 1 week ago via phone post discharge from the hospital with acute decompensated CHF.  Today he tells me that his breathing is okay as long as he sitting.  However he has difficulty breathing when he has to wear mask which we have been asked to do due to the [place] pandemic.  He is also short of breath with ambulation.  -He feels he needs to use his oxygen during the day with any a bit of ambulation that he does.  Currently the oxygen was prescribed for him to wear at nights. -Referred for sleep study but patient states he has not been called as yet.  Endorses daytime sleepiness and loud snoring   He plans to return to work at [employer].  He previously was working 3 days a week 12-hour shifts.  He does not feel that he can do the 12-hour shifts anymore so he spoke with his employer about decreasing his hours.  They told him that he can work 32 hours a week which would be for 8-hour shifts.  However patient states that he would not be able to pay his bills working only 32 hours a week.  He has not decided what he will do as yet.  He wanted to know about whether he could apply for disability. -He remains compliant with his medications including torsemide.  He has not had any increased lower extremity edema.  He has had weight loss.  He did come to the lab to have blood test done.  Iron studies are as shown below.  Patient Active Problem List   Diagnosis Date Noted  . Acute on chronic respiratory failure with hypoxia and hypercapnia ([place]) 07/14/2019  . Acute CHF (congestive heart failure) ([place]) 02/08/2019  . Acute  respiratory failure with hypoxia ([place]) 02/08/2019  . HTN (hypertension)   . Morbid obesity ([place])      Current Outpatient Medications on File Prior to Visit  Medication Sig Dispense Refill  . aspirin 81 MG EC tablet Take 1 tablet (81 mg total) by mouth daily. 30 tablet   . atorvastatin (LIPITOR) 10 MG tablet Take 1 tablet (10 mg total) by mouth daily. 90 tablet 3  . blood glucose meter kit and supplies Dispense based on patient and insurance preference. Check sugar once daily or prn. (FOR ICD-10 E10.9, E11.9). 1 each 0  . carvedilol (COREG) 12.5 MG tablet Take 1 tablet (12.5 mg total) by mouth 2 (two) times daily with a meal. 60 tablet 3  . iron polysaccharides (NU-IRON) 150 MG capsule Take 1 capsule by mouth daily.    . Melatonin 10 MG TABS Take 1 tablet by mouth at bedtime as needed.    . meloxicam (MOBIC) 15 MG tablet Take 1 tablet (15 mg total) by mouth daily. 30 tablet 0  . metFORMIN (GLUCOPHAGE) 500 MG tablet Take 1 tablet (500 mg total) by mouth 2 (two) times daily with a meal. 180 tablet 3  . torsemide (DEMADEX) 20 MG tablet Take 1 tablet (20 mg total) by mouth 2 (two) times daily. 60 tablet 3  . losartan (  COZAAR) 50 MG tablet Take 1 tablet (50 mg total) by mouth daily. (Patient not taking: Reported on 07/25/2019) 30 tablet 2   No current facility-administered medications on file prior to visit.     No Known Allergies  Social History   Socioeconomic History  . Marital status: Single    Spouse name: Not on file  . Number of children: Not on file  . Years of education: Not on file  . Highest education level: Not on file  Occupational History  . Not on file  Social Needs  . Financial resource strain: Not on file  . Food insecurity    Worry: Not on file    Inability: Not on file  . Transportation needs    Medical: Not on file    Non-medical: Not on file  Tobacco Use  . Smoking status: Never Smoker  . Smokeless tobacco: Never Used  Substance and Sexual Activity  . Alcohol  use: Never    Frequency: Never  . Drug use: Never  . Sexual activity: Not on file  Lifestyle  . Physical activity    Days per week: Not on file    Minutes per session: Not on file  . Stress: Not on file  Relationships  . Social Herbalist on phone: Not on file    Gets together: Not on file    Attends religious service: Not on file    Active member of club or organization: Not on file    Attends meetings of clubs or organizations: Not on file    Relationship status: Not on file  . Intimate partner violence    Fear of current or ex partner: Not on file    Emotionally abused: Not on file    Physically abused: Not on file    Forced sexual activity: Not on file  Other Topics Concern  . Not on file  Social History Narrative  . Not on file    Family History  Problem Relation Age of Onset  . Cancer Mother        Patient is not sure which type of cancer  . Leukemia Father     Past Surgical History:  Procedure Laterality Date  . C-spine surgery      ROS: Review of Systems Negative except as stated above  PHYSICAL EXAM: BP 131/84   Pulse 94   Temp (!) 97.2 F (36.2 C) (Temporal)   Resp 18   Ht '5\' 10"'  (1.778 m)   Wt (!) 369 lb 9.6 oz (167.6 kg)   SpO2 90%   BMI 53.03 kg/m   Wt Readings from Last 3 Encounters:  08/01/19 (!) 369 lb 9.6 oz (167.6 kg)  05/29/19 (!) 390 lb 12.8 oz (177.3 kg)  03/07/19 (!) 370 lb 6.4 oz (168 kg)   Pulse ox at rest on room air 88-90% Pulse ox with ambulation on room air falls to 86% Pulse ox at rest on 2 L nasal cannula after 5 minutes is 95% and with ambulation 94%  Physical Exam  General appearance - alert, well appearing, obese African-American male and in no distress Mental status - normal mood, behavior, speech, dress, motor activity, and thought processes Chest -breath sounds decreased.  No crackles or wheezes heard Heart - normal rate, regular rhythm, normal S1, S2, no murmurs, rubs, clicks or gallops Extremities  -no lower extremity edema  CMP Latest Ref Rng & Units 07/27/2019 05/29/2019 03/07/2019  Glucose 65 - 99 mg/dL 111(H) 115(H) 83  BUN 6 - 24 mg/dL '10 11 10  ' Creatinine 0.76 - 1.27 mg/dL 0.81 0.89 1.03  Sodium 134 - 144 mmol/L 142 142 141  Potassium 3.5 - 5.2 mmol/L 4.6 4.3 4.2  Chloride 96 - 106 mmol/L 95(L) 103 100  CO2 20 - 29 mmol/L 33(H) 29 25  Calcium 8.7 - 10.2 mg/dL 9.2 8.7 9.4  Total Protein 6.0 - 8.5 g/dL - 6.8 7.0  Total Bilirubin 0.0 - 1.2 mg/dL - 0.3 <0.2  Alkaline Phos 39 - 117 IU/L - 83 91  AST 0 - 40 IU/L - 17 15  ALT 0 - 44 IU/L - 14 19   Lipid Panel     Component Value Date/Time   CHOL 165 05/29/2019 0953   TRIG 113 05/29/2019 0953   HDL 44 05/29/2019 0953   CHOLHDL 3.8 05/29/2019 0953   LDLCALC 98 05/29/2019 0953    CBC    Component Value Date/Time   WBC 3.7 (L) 02/18/2019 0424   RBC 6.38 (H) 02/18/2019 0424   HGB 13.1 02/18/2019 0424   HCT 44.8 02/18/2019 0424   PLT 223 02/18/2019 0424   MCV 70.2 (L) 02/18/2019 0424   MCH 20.5 (L) 02/18/2019 0424   MCHC 29.2 (L) 02/18/2019 0424   RDW 21.3 (H) 02/18/2019 0424   LYMPHSABS 1.2 02/11/2019 0759   MONOABS 0.4 02/11/2019 0759   EOSABS 0.1 02/11/2019 0759   BASOSABS 0.0 02/11/2019 0759   Lab Results  Component Value Date   IRON 74 07/27/2019   TIBC 338 07/27/2019   FERRITIN 38 07/27/2019    ASSESSMENT AND PLAN:  1. Chronic diastolic congestive heart failure ([place]) Patient appears compensated.  Weight is down 21 pounds since June.  He will continue current medications  2. Essential hypertension Not at goal.  We will restart Cozaar but at a lower dose of 25 mg - losartan (COZAAR) 25 MG tablet; Take 1 tablet (25 mg total) by mouth daily.  Dispense: 30 tablet; Refill: 6  3. Acute respiratory failure with hypoxia (HCC) 4. Chronic respiratory failure with hypoxia (HCC) -We will need O2 at rest and with exertion.  [facility]  I will have our caseworker call them and  see if we can get portable oxygen for him.  We will have him stay out of work until he gets the portable oxygen. -In regards to him returning to work, I told patient that he probably would get approved for disability but the disability process takes time so he would have to have a plan in place to have his bills covered until he is approved. -He has not made a decision about whether he will cut back to 8-hour shifts or stick with the 12 hours.  5. Loud snoring - PSG Sleep Study; Future    Patient was given the opportunity to ask questions.  Patient verbalized understanding of the plan and was able to repeat key elements of the plan.   No orders of the defined types were placed in this encounter.    Requested Prescriptions    No prescriptions requested or ordered in this encounter    No follow-ups on file.  Karle Plumber, MD, FACP

## 2019-08-01 NOTE — Progress Notes (Signed)
Is still holding the Losartan.  Hasn't returned to work. States that he doesn't think he can work 12 hr shifts with a mask on. Feels smothered.

## 2019-08-01 NOTE — Patient Instructions (Signed)
Restart Losartan at 25 mg daily for better blood pressure control.

## 2019-08-02 ENCOUNTER — Telehealth: Payer: Self-pay

## 2019-08-02 NOTE — Telephone Encounter (Signed)
Request for Portable O2 concentrator received from Dr Wynetta Emery.  Attempted to contact patient # [phone] explain to him that [facility] has not been carrying POCs and he will need to change to [place] for the POC and all O2 equipment.  Message left with call back requested to this CM # [phone]

## 2019-08-03 ENCOUNTER — Telehealth: Payer: Self-pay | Admitting: Family Medicine

## 2019-08-03 ENCOUNTER — Telehealth: Payer: Self-pay

## 2019-08-03 NOTE — Telephone Encounter (Signed)
Call placed to patient and explained that [facility] does not carry portable O2 concentrators. Currently [facility] has them available and the order for the POC can be sent to [facility].  They will need to verify his insurance coverage. If approved and they are going to provide the POC, his current O2 will need to be switched to [place] as all O2 DMe and supplies need to be billed through the same company. The current O2 wound not be removed from his home until the O2 from [place] in in place. He was in agreement to sending referral to [place].  Awaiting order for the POC.

## 2019-08-03 NOTE — Telephone Encounter (Signed)
Patient called to speak with you. Please follow up. °

## 2019-08-03 NOTE — Telephone Encounter (Signed)
Attempted to contact the patient # [phone] to explain that [facility] has not been carrying POCs and we need to order the POC from [place] and the O2 equipment that he already has will need to be switched to [place] as well. O2 can't be billed through 2 different companies  Message left with call back requested to this CM # [phone]

## 2019-08-08 ENCOUNTER — Telehealth: Payer: Self-pay | Admitting: Family Medicine

## 2019-08-08 NOTE — Telephone Encounter (Signed)
Patient called he has not received his oxygen yet wants to know if he can still go back to work at the end of the week or can you write him out for more time

## 2019-08-09 ENCOUNTER — Telehealth: Payer: Self-pay | Admitting: Internal Medicine

## 2019-08-09 DIAGNOSIS — I5032 Chronic diastolic (congestive) heart failure: Secondary | ICD-10-CM

## 2019-08-09 NOTE — Telephone Encounter (Signed)
PC placed to pt today.  I left VMM letting him know that I received letter and form from his employer [employer] letting me know his job description and wanting to know if he would be able to safely do his job.  He will be required to wear mask while on the premises.  I am not sure that he will be able to return right now.   I would like to get him in with cardiology to get opinion on him and would also like for him to have sleep study done. I will write letter extending him being out of work for 4-6 wks.  Will try to reach him again to discuss.

## 2019-08-10 ENCOUNTER — Telehealth: Payer: Self-pay

## 2019-08-10 ENCOUNTER — Telehealth: Payer: Self-pay | Admitting: Internal Medicine

## 2019-08-10 NOTE — Telephone Encounter (Signed)
Phone call placed to patient today as a follow-up from the voice message that I left him yesterday.  Patient states that he did receive my message and he agrees that he will probably have to be out of work for some time as his employer will not allow him to work without wearing a mask.  He feels it will be hard to breath wearing the mask even with portable O2.  I told him that I have submitted a referral for his sleep study and to see a cardiologist.  I have written a letter holding him out of work for about 4 to 6 weeks and we can reevaluate him again at that time. He will also start the process of applying for disability.

## 2019-08-10 NOTE — Telephone Encounter (Signed)
Signed CMN for POC faxed to [facility] 

## 2019-08-15 ENCOUNTER — Telehealth: Payer: Self-pay

## 2019-08-15 NOTE — Telephone Encounter (Signed)
Call placed to [place], spoke to [place] who confirmed that patient has been set up with POC

## 2019-08-16 ENCOUNTER — Other Ambulatory Visit (HOSPITAL_COMMUNITY)
Admission: RE | Admit: 2019-08-16 | Discharge: 2019-08-16 | Disposition: A | Discharge status: Home / Self Care | Payer: 59 | Source: Ambulatory Visit | Attending: Internal Medicine | Admitting: Internal Medicine

## 2019-08-16 DIAGNOSIS — Z20828 Contact with and (suspected) exposure to other viral communicable diseases: Secondary | ICD-10-CM | POA: Diagnosis not present

## 2019-08-16 DIAGNOSIS — Z01812 Encounter for preprocedural laboratory examination: Secondary | ICD-10-CM | POA: Diagnosis present

## 2019-08-16 LAB — SARS CORONAVIRUS 2 (TAT 6-24 HRS): SARS Coronavirus 2: NEGATIVE

## 2019-08-18 ENCOUNTER — Other Ambulatory Visit: Payer: Self-pay

## 2019-08-18 ENCOUNTER — Ambulatory Visit (HOSPITAL_BASED_OUTPATIENT_CLINIC_OR_DEPARTMENT_OTHER): Payer: 59 | Attending: Family Medicine | Admitting: Internal Medicine

## 2019-08-18 VITALS — Ht 70.0 in | Wt 369.0 lb

## 2019-08-18 DIAGNOSIS — G4733 Obstructive sleep apnea (adult) (pediatric): Secondary | ICD-10-CM

## 2019-08-18 DIAGNOSIS — G4730 Sleep apnea, unspecified: Secondary | ICD-10-CM | POA: Diagnosis not present

## 2019-08-21 ENCOUNTER — Other Ambulatory Visit: Payer: Self-pay

## 2019-08-22 NOTE — Progress Notes (Signed)
Cardiology Office Note   Date:  08/24/2019   ID:  Edwin Mcclain, DOB Dec 09, 1966, MRN [id number]  PCP:  No primary care provider on file.  Cardiologist:   Minus Breeding, MD Referring: Ladell Pier, MD  Chief Complaint  Patient presents with  . Shortness of Breath     History of Present Illness: Edwin Mcclain is a 53 y.o. male who is referred by Ladell Pier, MD for evaluation of acute on chronic diastolic HF.   He was in the hospital for this in Feb and I reviewed these records for this visit. I do note that on admission he was not taking his meds.  He was in the hospital at [facility] in July.  I reviewed these records.  Hospitalizations have been complicated by acute renal insufficiency.  He again had respiratory failure.   However, since being in the hospital last time he is done well.  He has been taking his medications and watching his diet much better.  He was eating salt prior to this last hospitalization.  He has been taking his medicines but he does not have a scale.  He denies any new cardiovascular symptoms.  He is not describing PND or orthopnea.  He is not describing palpitations, presyncope or syncope.  He has had no edema.  He did just have a sleep study however, the results are not yet available.  Past Medical History:  Diagnosis Date  . Acute diastolic HF (heart failure) ([place]) 02/08/2019  . Acute respiratory failure with hypoxia ([place]) 02/08/2019  . HTN (hypertension)   . Morbid obesity ([place])     Past Surgical History:  Procedure Laterality Date  . C-spine surgery       Current Outpatient Medications  Medication Sig Dispense Refill  . aspirin 81 MG EC tablet Take 1 tablet (81 mg total) by mouth daily. 30 tablet   . atorvastatin (LIPITOR) 10 MG tablet Take 1 tablet (10 mg total) by mouth daily. 90 tablet 3  . blood glucose meter kit and supplies Dispense based on patient and insurance preference. Check sugar once daily or prn. (FOR ICD-10 E10.9, E11.9). 1  each 0  . carvedilol (COREG) 12.5 MG tablet Take 1 tablet (12.5 mg total) by mouth 2 (two) times daily with a meal. 60 tablet 3  . iron polysaccharides (NU-IRON) 150 MG capsule Take 1 capsule by mouth daily.    Marland Kitchen losartan (COZAAR) 25 MG tablet Take 1 tablet (25 mg total) by mouth daily. 30 tablet 6  . Melatonin 10 MG TABS Take 1 tablet by mouth at bedtime as needed.    . meloxicam (MOBIC) 15 MG tablet Take 1 tablet (15 mg total) by mouth daily. 30 tablet 0  . metFORMIN (GLUCOPHAGE) 500 MG tablet Take 1 tablet (500 mg total) by mouth 2 (two) times daily with a meal. 180 tablet 3  . torsemide (DEMADEX) 20 MG tablet Take 1 tablet (20 mg total) by mouth 2 (two) times daily. 60 tablet 3   No current facility-administered medications for this visit.     Allergies:   Patient has no known allergies.    Social History:  The patient  reports that he has never smoked. He has never used smokeless tobacco. He reports that he does not drink alcohol or use drugs.   Family History:  The patient's family history includes Cancer in his mother; Leukemia in his father.    ROS:  Please see the history of present illness.  Otherwise, review of systems are positive for none.   All other systems are reviewed and negative.    PHYSICAL EXAM: VS:  BP 116/70 (BP Location: Right Arm, Patient Position: Sitting, Cuff Size: Large)   Pulse 85   Temp 97.9 F (36.6 C)   Ht '5\' 11"'  (1.803 m)   Wt (!) 383 lb (173.7 kg)   BMI 53.42 kg/m  , BMI Body mass index is 53.42 kg/m. GENERAL:  Well appearing HEENT:  Pupils equal round and reactive, fundi not visualized, oral mucosa unremarkable NECK:  No jugular venous distention, waveform within normal limits, carotid upstroke brisk and symmetric, no bruits, no thyromegaly LYMPHATICS:  No cervical, inguinal adenopathy LUNGS:  Clear to auscultation bilaterally BACK:  No CVA tenderness CHEST:  Unremarkable HEART:  PMI not displaced or sustained,S1 and S2 within normal  limits, no S3, no S4, no clicks, no rubs, no murmurs ABD:  Flat, positive bowel sounds normal in frequency in pitch, no bruits, no rebound, no guarding, no midline pulsatile mass, no hepatomegaly, no splenomegaly EXT:  2 plus pulses throughout, no edema, no cyanosis no clubbing SKIN:  No rashes no nodules NEURO:  Cranial nerves II through XII grossly intact, motor grossly intact throughout PSYCH:  Cognitively intact, oriented to person place and time    EKG:  EKG is ordered today. The ekg ordered today demonstrates sinus rhythm, rate 85, right axis deviation, no acute ST-T wave changes.   Recent Labs: 02/07/2019: B Natriuretic Peptide 199.7 02/18/2019: Hemoglobin 13.1; Platelets 223 03/07/2019: Magnesium 2.0 05/29/2019: ALT 14 07/27/2019: BUN 10; Creatinine, Ser 0.81; Potassium 4.6; Sodium 142    Lipid Panel    Component Value Date/Time   CHOL 165 05/29/2019 0953   TRIG 113 05/29/2019 0953   HDL 44 05/29/2019 0953   CHOLHDL 3.8 05/29/2019 0953   LDLCALC 98 05/29/2019 0953      Wt Readings from Last 3 Encounters:  08/24/19 (!) 383 lb (173.7 kg)  08/18/19 (!) 369 lb (167.4 kg)  08/01/19 (!) 369 lb 9.6 oz (167.6 kg)      Other studies Reviewed: Additional studies/ records that were reviewed today include: Hospital records. Review of the above records demonstrates:  Please see elsewhere in the note.     ASSESSMENT AND PLAN:  ACUTE ON CHRONIC DIASTOLIC HF:   We long discussion about salt.  For now he seems to be euvolemic and doing well from a cardiovascular standpoint.  No change in therapy.  I am going to try to get him scheduled.  HTN:  His blood pressure now seems well controlled.  He will continue the meds as listed.  AKI:    He was in in this earlier in the year but creat most recently was back to normal.  We can follow this up when we see him back.  SLEEP APNEA:   He had a sleep study but the results are not yet available.  I will set him up for a sleep visit.  DM:  A1c is 6.5.  He will continue the meds as listed.  SUPER MORBID OBESITY: We had a long discussion about diet and exercise.    Current medicines are reviewed at length with the patient today.  The patient does not have concerns regarding medicines.  The following changes have been made:  no change  Labs/ tests ordered today include: None  Orders Placed This Encounter  Procedures  . EKG 12-Lead     Disposition:   FU with APP in six  weeks.      Signed, Minus Breeding, MD  08/24/2019 2:49 PM    [place]

## 2019-08-24 ENCOUNTER — Other Ambulatory Visit: Payer: Self-pay

## 2019-08-24 ENCOUNTER — Encounter: Payer: Self-pay | Admitting: Cardiology

## 2019-08-24 ENCOUNTER — Ambulatory Visit (INDEPENDENT_AMBULATORY_CARE_PROVIDER_SITE_OTHER): Payer: 59 | Admitting: Cardiology

## 2019-08-24 VITALS — BP 116/70 | HR 85 | Temp 97.9°F | Ht 71.0 in | Wt 383.0 lb

## 2019-08-24 DIAGNOSIS — E1169 Type 2 diabetes mellitus with other specified complication: Secondary | ICD-10-CM | POA: Insufficient documentation

## 2019-08-24 DIAGNOSIS — R0683 Snoring: Secondary | ICD-10-CM | POA: Diagnosis not present

## 2019-08-24 DIAGNOSIS — N179 Acute kidney failure, unspecified: Secondary | ICD-10-CM

## 2019-08-24 DIAGNOSIS — E118 Type 2 diabetes mellitus with unspecified complications: Secondary | ICD-10-CM | POA: Insufficient documentation

## 2019-08-24 DIAGNOSIS — I5033 Acute on chronic diastolic (congestive) heart failure: Secondary | ICD-10-CM | POA: Diagnosis not present

## 2019-08-24 DIAGNOSIS — I10 Essential (primary) hypertension: Secondary | ICD-10-CM

## 2019-08-24 NOTE — Patient Instructions (Addendum)
Medication Instructions:  NO CHANGE If you need a refill on your cardiac medications before your next appointment, please call your pharmacy.   Lab work: If you have labs (blood work) drawn today and your tests are completely normal, you will receive your results only by: Marland Kitchen MyChart Message (if you have MyChart) OR . A paper copy in the mail If you have any lab test that is abnormal or we need to change your treatment, we will call you to review the results.  Follow-Up: At [facility], you and your health needs are our priority.  As part of our continuing mission to provide you with exceptional heart care, we have created designated Provider Care Teams.  These Care Teams include your primary Cardiologist (physician) and Advanced Practice Providers (APPs -  Physician Assistants and Nurse Practitioners) who all work together to provide you with the care you need, when you need it. Your physician recommends that you schedule a follow-up appointment in: [place] UP OF SLEEP STUDY

## 2019-08-25 ENCOUNTER — Telehealth: Payer: Self-pay | Admitting: Licensed Clinical Social Worker

## 2019-08-25 NOTE — Telephone Encounter (Signed)
CSW contacted patient to inform that a scale has been ordered and will be delivered to his home. Patient grateful for the support. CSW available as needed. Raquel Sarna, [place], [place] ]

## 2019-08-26 DIAGNOSIS — G4730 Sleep apnea, unspecified: Secondary | ICD-10-CM

## 2019-08-26 NOTE — Procedures (Signed)
Patient Name: Edwin Mcclain, Edwin Mcclain Date: 08/18/2019 Gender: Male D.O.B: 1966-03-03 Age (years): 60 Referring Provider: Molli Barrows FNP Height (inches): 53 Interpreting Physician: Baird Lyons MD, ABSM Weight (lbs): 369 RPSGT: Earney [place] BMI: 51 MRN: [id number] Neck Size: 21.50  CLINICAL INFORMATION Sleep Study Type: Split Night CPAP Indication for sleep study: Obesity Epworth Sleepiness Score: 6  SLEEP STUDY TECHNIQUE As per the AASM Manual for the Scoring of Sleep and Associated Events v2.3 (April 2016) with a hypopnea requiring 4% desaturations.  The channels recorded and monitored were frontal, central and occipital EEG, electrooculogram (EOG), submentalis EMG (chin), nasal and oral airflow, thoracic and abdominal wall motion, anterior tibialis EMG, snore microphone, electrocardiogram, and pulse oximetry. Continuous positive airway pressure (CPAP) was initiated when the patient met split night criteria and was titrated according to treat sleep-disordered breathing.  MEDICATIONS Medications self-administered by patient taken the night of the study : none reported  RESPIRATORY PARAMETERS Diagnostic  Total AHI (/hr): 70.8 RDI (/hr): 77.1 OA Index (/hr): 17.3 CA Index (/hr): 0.0 REM AHI (/hr): N/A NREM AHI (/hr): 70.8 Supine AHI (/hr): 96.9 Non-supine AHI (/hr): 57.8 Min O2 Sat (%): 51.0 Mean O2 (%): 85.7 Time below 88% (min): 60.6   Titration  Optimal Pressure (cm): 17 AHI at Optimal Pressure (/hr): 0.0 Min O2 at Optimal Pressure (%): 86.0 Supine % at Optimal (%): 0 Sleep % at Optimal (%): 100   SLEEP ARCHITECTURE The recording time for the entire night was 375.1 minutes.  During a baseline period of 148.4 minutes, the patient slept for 124.5 minutes in REM and nonREM, yielding a sleep efficiency of 83.9%%. Sleep onset after lights out was 0.8 minutes with a REM latency of N/A minutes. The patient spent 1.2%% of the night in stage N1 sleep, 98.8%% in stage N2  sleep, 0.0%% in stage N3 and 0% in REM.  During the titration period of 221.7 minutes, the patient slept for 214.8 minutes in REM and nonREM, yielding a sleep efficiency of 96.9%%. Sleep onset after CPAP initiation was 2.5 minutes with a REM latency of 44.5 minutes. The patient spent 1.2%% of the night in stage N1 sleep, 65.1%% in stage N2 sleep, 0.0%% in stage N3 and 33.8% in REM.  CARDIAC DATA The 2 lead EKG demonstrated sinus rhythm. The mean heart rate was 100.0 beats per minute. Other EKG findings include: PVCs.  LEG MOVEMENT DATA The total Periodic Limb Movements of Sleep (PLMS) were 0. The PLMS index was 0.0 .  IMPRESSIONS - Severe obstructive sleep apnea occurred during the diagnostic portion of the study (AHI = 70.8/hour). An optimal PAP pressure was selected for this patient ( 17 cm of water) - No significant central sleep apnea occurred during the diagnostic portion of the study (CAI = 0.0/hour). - Severe oxygen desaturation was noted during the diagnostic portion of the study (Min O2 = 51.0%).  - Supplemental O2 was added per protocol and titrated to 3L. At CPAP 17, with O2 3L, minimum sat was 86% and mean sat 92.5%. - The patient snored with loud snoring volume during the diagnostic portion of the study. - EKG findings include PVCs. - Clinically significant periodic limb movements did not occur during sleep.  DIAGNOSIS - Obstructive Sleep Apnea (327.23 [G47.33 ICD-10]) - Nocturnal Hypoxemia  RECOMMENDATIONS - Trial of CPAP therapy on 17 cm H2O or autopap 15-20 cwp. - Patient used a Medium size Fisher&Paykel Full Face Mask Simplus mask and heated humidification. - CPAP alone was insufficient to control Nocturnal Hypoxemia.  Recommend supplemental O2 during sleep at 3L/ min. - Be careful with alcohol, sedatives and other CNS depressants that may worsen sleep apnea and disrupt normal sleep architecture. - Sleep hygiene should be reviewed to assess factors that may improve sleep  quality. - Weight management and regular exercise should be initiated or continued.  [Electronically signed] 08/26/2019 12:02 PM  Baird Lyons MD, [place], American Board of Sleep Medicine   NPI: [id number]                          [place], [place] of Sleep [facility]  ELECTRONICALLY SIGNED ON:  08/26/2019, 11:57 AM [place] PH: [phone]   FX: [phone] [place]

## 2019-09-12 ENCOUNTER — Other Ambulatory Visit: Payer: Self-pay

## 2019-09-12 ENCOUNTER — Ambulatory Visit (INDEPENDENT_AMBULATORY_CARE_PROVIDER_SITE_OTHER): Payer: 59 | Admitting: Internal Medicine

## 2019-09-12 VITALS — BP 150/97 | HR 94 | Temp 97.3°F | Resp 17 | Ht 71.0 in | Wt 379.0 lb

## 2019-09-12 DIAGNOSIS — I10 Essential (primary) hypertension: Secondary | ICD-10-CM

## 2019-09-12 DIAGNOSIS — Z23 Encounter for immunization: Secondary | ICD-10-CM

## 2019-09-12 DIAGNOSIS — Z1211 Encounter for screening for malignant neoplasm of colon: Secondary | ICD-10-CM

## 2019-09-12 DIAGNOSIS — J9611 Chronic respiratory failure with hypoxia: Secondary | ICD-10-CM | POA: Diagnosis not present

## 2019-09-12 DIAGNOSIS — L602 Onychogryphosis: Secondary | ICD-10-CM

## 2019-09-12 DIAGNOSIS — E119 Type 2 diabetes mellitus without complications: Secondary | ICD-10-CM

## 2019-09-12 DIAGNOSIS — I5032 Chronic diastolic (congestive) heart failure: Secondary | ICD-10-CM | POA: Diagnosis not present

## 2019-09-12 DIAGNOSIS — G4733 Obstructive sleep apnea (adult) (pediatric): Secondary | ICD-10-CM | POA: Diagnosis not present

## 2019-09-12 DIAGNOSIS — Z6841 Body Mass Index (BMI) 40.0 and over, adult: Secondary | ICD-10-CM

## 2019-09-12 NOTE — Progress Notes (Signed)
Patient ID: Edwin Mcclain, male    DOB: 1966-10-29  MRN: [id number]  CC: RTW Evaluation  Subjective: Edwin Mcclain is a 53 y.o. male who presents for f/u on hypoxia, CHF His concerns today include:  Patient with history of HTN,HL,CHF(echo 01/2019 60 to 65%), morbid obesity, DM, chronic respiratory failure with hypoxia  Chronic diastolic CHF/HTN/Chronic resp failure: Pt suppose to be on O2 continuously but does not have it with him today.  Reports he thought he only needed to wear it if he is going to grocery store or other places where he would need to do a lot of walking.   -Since last visit with me patient has seen cardiologist Dr. Percival Spanish on 08/24/2019.  Found to be compensated in terms of CHF.  Current medications were continued.  Weight loss encouraged. -Since I saw him in August, his weight is up 10 pounds. Reports compliance with meds but did not take as yet for this morning.  Denies any PND.  No SOB with short walks of about 100-200 feet.  Takes O2 with him when he has to walk longer distances.  Still c/o SOB when having to wear facial mask.  Feels he has to work but he knows that his employer would not allow him back if he is unable to wear the facemask. -He has not decided about whether he will apply for disability.  He feels he has to work. -sister checks BP for him once a wk.  Reports reading have been good but does not recall specifically his ranges.  He also had a sleep study done.  This revealed severe sleep apnea.  AutoPap was recommended with supplemental O2 3 L at nights. -stopped ETOH use in 01/2019.   DM:  No device to check BS but taking Metformin as prescribed. Cooks most of his meals.  He drinks water and juices.   Overdue for eye exam.  Denies blurred vision. Denies any numbness or tingling in the feet or lower extremities.  HM: Agreeable to receiving flu vaccine today.  Agreeable for referral for eye exam  Patient Active Problem List   Diagnosis Date Noted  .  Acute on chronic diastolic HF (heart failure) ([place]) 08/24/2019  . Snoring 08/24/2019  . AKI (acute kidney injury) ([place]) 08/24/2019  . Type 2 diabetes mellitus with complication, without long-term current use of insulin ([place]) 08/24/2019  . Acute on chronic respiratory failure with hypoxia and hypercapnia ([place]) 07/14/2019  . Acute CHF (congestive heart failure) ([place]) 02/08/2019  . Acute respiratory failure with hypoxia ([place]) 02/08/2019  . HTN (hypertension)   . Morbid obesity ([place])      Current Outpatient Medications on File Prior to Visit  Medication Sig Dispense Refill  . aspirin 81 MG EC tablet Take 1 tablet (81 mg total) by mouth daily. 30 tablet   . atorvastatin (LIPITOR) 10 MG tablet Take 1 tablet (10 mg total) by mouth daily. 90 tablet 3  . blood glucose meter kit and supplies Dispense based on patient and insurance preference. Check sugar once daily or prn. (FOR ICD-10 E10.9, E11.9). 1 each 0  . carvedilol (COREG) 12.5 MG tablet Take 1 tablet (12.5 mg total) by mouth 2 (two) times daily with a meal. 60 tablet 3  . iron polysaccharides (NU-IRON) 150 MG capsule Take 1 capsule by mouth daily.    Marland Kitchen losartan (COZAAR) 25 MG tablet Take 1 tablet (25 mg total) by mouth daily. 30 tablet 6  . Melatonin 10 MG TABS Take 1  tablet by mouth at bedtime as needed.    . meloxicam (MOBIC) 15 MG tablet Take 1 tablet (15 mg total) by mouth daily. 30 tablet 0  . metFORMIN (GLUCOPHAGE) 500 MG tablet Take 1 tablet (500 mg total) by mouth 2 (two) times daily with a meal. 180 tablet 3  . torsemide (DEMADEX) 20 MG tablet Take 1 tablet (20 mg total) by mouth 2 (two) times daily. 60 tablet 3   No current facility-administered medications on file prior to visit.     No Known Allergies  Social History   Socioeconomic History  . Marital status: Single    Spouse name: Not on file  . Number of children: Not on file  . Years of education: Not on file  . Highest education level: Not on file  Occupational  History  . Not on file  Social Needs  . Financial resource strain: Not on file  . Food insecurity    Worry: Not on file    Inability: Not on file  . Transportation needs    Medical: Not on file    Non-medical: Not on file  Tobacco Use  . Smoking status: Never Smoker  . Smokeless tobacco: Never Used  Substance and Sexual Activity  . Alcohol use: Never    Frequency: Never  . Drug use: Never  . Sexual activity: Not on file  Lifestyle  . Physical activity    Days per week: Not on file    Minutes per session: Not on file  . Stress: Not on file  Relationships  . Social connections    Talks on phone: Not on file    Gets together: Not on file    Attends religious service: Not on file    Active member of club or organization: Not on file    Attends meetings of clubs or organizations: Not on file    Relationship status: Not on file  . Intimate partner violence    Fear of current or ex partner: Not on file    Emotionally abused: Not on file    Physically abused: Not on file    Forced sexual activity: Not on file  Other Topics Concern  . Not on file  Social History Narrative   Lives with cousin for now.  Works at [employer].      Family History  Problem Relation Age of Onset  . Cancer Mother        Patient is not sure which type of cancer  . Leukemia Father     Past Surgical History:  Procedure Laterality Date  . C-spine surgery      ROS: Review of Systems Negative except as stated above  PHYSICAL EXAM: BP (!) 150/97   Pulse 94   Temp (!) 97.3 F (36.3 C) (Temporal)   Resp 17   Ht 5' 11" (1.803 m)   Wt (!) 379 lb (171.9 kg)   SpO2 (!) 86%   BMI 52.86 kg/m   Wt Readings from Last 3 Encounters:  09/12/19 (!) 379 lb (171.9 kg)  08/24/19 (!) 383 lb (173.7 kg)  08/18/19 (!) 369 lb (167.4 kg)  Pulse ox recorded above is on room air at rest.  Highest reading that we got at rest with him wearing his mass is 90% on room air  Physical Exam  General appearance - alert,  well appearing, obese middle-aged African-American male and in no distress Mental status - normal mood, behavior, speech, dress, motor activity, and thought processes Neck -   supple, no significant adenopathy Chest - clear to auscultation, no wheezes, rales or rhonchi, symmetric air entry Heart - normal rate, regular rhythm, normal S1, S2, no murmurs, rubs, clicks or gallops Extremities -no lower extremity edema Diabetic Foot Exam - Simple   Simple Foot Form Visual Inspection See comments: Yes Sensation Testing Intact to touch and monofilament testing bilaterally: Yes Pulse Check Posterior Tibialis and Dorsalis pulse intact bilaterally: Yes Comments Toenails are thick, discolored and overgrown.  He has a lot of thick crusty dead skin on the soles and dorsal surfaces of both feet     CMP Latest Ref Rng & Units 07/27/2019 05/29/2019 03/07/2019  Glucose 65 - 99 mg/dL 111(H) 115(H) 83  BUN 6 - 24 mg/dL 10 11 10  Creatinine 0.76 - 1.27 mg/dL 0.81 0.89 1.03  Sodium 134 - 144 mmol/L 142 142 141  Potassium 3.5 - 5.2 mmol/L 4.6 4.3 4.2  Chloride 96 - 106 mmol/L 95(L) 103 100  CO2 20 - 29 mmol/L 33(H) 29 25  Calcium 8.7 - 10.2 mg/dL 9.2 8.7 9.4  Total Protein 6.0 - 8.5 g/dL - 6.8 7.0  Total Bilirubin 0.0 - 1.2 mg/dL - 0.3 <0.2  Alkaline Phos 39 - 117 IU/L - 83 91  AST 0 - 40 IU/L - 17 15  ALT 0 - 44 IU/L - 14 19   Lipid Panel     Component Value Date/Time   CHOL 165 05/29/2019 0953   TRIG 113 05/29/2019 0953   HDL 44 05/29/2019 0953   CHOLHDL 3.8 05/29/2019 0953   LDLCALC 98 05/29/2019 0953    CBC    Component Value Date/Time   WBC 3.7 (L) 02/18/2019 0424   RBC 6.38 (H) 02/18/2019 0424   HGB 13.1 02/18/2019 0424   HCT 44.8 02/18/2019 0424   PLT 223 02/18/2019 0424   MCV 70.2 (L) 02/18/2019 0424   MCH 20.5 (L) 02/18/2019 0424   MCHC 29.2 (L) 02/18/2019 0424   RDW 21.3 (H) 02/18/2019 0424   LYMPHSABS 1.2 02/11/2019 0759   MONOABS 0.4 02/11/2019 0759   EOSABS 0.1 02/11/2019  0759   BASOSABS 0.0 02/11/2019 0759    ASSESSMENT AND PLAN: 1. Chronic diastolic congestive heart failure (HCC) -Despite 10 pounds weight gain, patient appears compensated.  Encouraged him to continue taking medications as prescribed including Cozaar, torsemide, carvedilol. Discussed and encourage low-salt diet.  2. Chronic respiratory failure with hypoxia (HCC) Advised patient to wear his oxygen at all times.  Purpose of today's visit was to recheck him to see whether his oxygen level stays above 90 even with wearing his face mask. -We will bring him back in 1 month for recheck to see whether oxygen levels improve once we get him on AutoPap for treatment of his sleep apnea.  Until then he will remain out of work.  3. OSA (obstructive sleep apnea) Prescription will be sent to [facility] for his AutoPap.  Discussed diagnosis of obstructive sleep apnea with the patient and management. -Advised him to avoid sedative drugs.  Advised against excessive use of alcohol around bedtime.  Encouraged weight loss.  4. Essential hypertension Not at goal today but patient has not taken his medications as yet for the morning.  5. Controlled type 2 diabetes mellitus without complication, without long-term current use of insulin (HCC) Continue metformin. Dietary counseling given.  Advised him to eliminate sugary drinks from his diet. - Ambulatory referral to Ophthalmology - Ambulatory referral to Podiatry - Hemoglobin A1c   6. Morbid obesity (HCC)   See #5 above  7. Overgrown toenails - Ambulatory referral to Podiatry  8. Need for influenza vaccination - Flu Vaccine QUAD 6+ mos PF IM (Fluarix Quad PF)  9. Screening for colon cancer - Cologuard; Future     Patient was given the opportunity to ask questions.  Patient verbalized understanding of the plan and was able to repeat key elements of the plan.   No orders of the defined types were placed in this encounter.    Requested Prescriptions     No prescriptions requested or ordered in this encounter    No follow-ups on file.  Deborah Johnson, MD, FACP 

## 2019-09-12 NOTE — Progress Notes (Signed)
Here for RTW evaluation.

## 2019-09-12 NOTE — Patient Instructions (Signed)
Please wear your oxygen whenever you are up moving around and at nights.    You have been referred to a foot doctor and to an eye doctor.     Diabetes Mellitus and Nutrition, Adult When you have diabetes (diabetes mellitus), it is very important to have healthy eating habits because your blood sugar (glucose) levels are greatly affected by what you eat and drink. Eating healthy foods in the appropriate amounts, at about the same times every day, can help you:  Control your blood glucose.  Lower your risk of heart disease.  Improve your blood pressure.  Reach or maintain a healthy weight. Every person with diabetes is different, and each person has different needs for a meal plan. Your health care provider may recommend that you work with a diet and nutrition specialist (dietitian) to make a meal plan that is best for you. Your meal plan may vary depending on factors such as:  The calories you need.  The medicines you take.  Your weight.  Your blood glucose, blood pressure, and cholesterol levels.  Your activity level.  Other health conditions you have, such as heart or kidney disease. How do carbohydrates affect me? Carbohydrates, also called carbs, affect your blood glucose level more than any other type of food. Eating carbs naturally raises the amount of glucose in your blood. Carb counting is a method for keeping track of how many carbs you eat. Counting carbs is important to keep your blood glucose at a healthy level, especially if you use insulin or take certain oral diabetes medicines. It is important to know how many carbs you can safely have in each meal. This is different for every person. Your dietitian can help you calculate how many carbs you should have at each meal and for each snack. Foods that contain carbs include:  Bread, cereal, rice, pasta, and crackers.  Potatoes and corn.  Peas, beans, and lentils.  Milk and yogurt.  Fruit and juice.  Desserts, such  as cakes, cookies, ice cream, and candy. How does alcohol affect me? Alcohol can cause a sudden decrease in blood glucose (hypoglycemia), especially if you use insulin or take certain oral diabetes medicines. Hypoglycemia can be a life-threatening condition. Symptoms of hypoglycemia (sleepiness, dizziness, and confusion) are similar to symptoms of having too much alcohol. If your health care provider says that alcohol is safe for you, follow these guidelines:  Limit alcohol intake to no more than 1 drink per day for nonpregnant women and 2 drinks per day for men. One drink equals 12 oz of beer, 5 oz of wine, or 1 oz of hard liquor.  Do not drink on an empty stomach.  Keep yourself hydrated with water, diet soda, or unsweetened iced tea.  Keep in mind that regular soda, juice, and other mixers may contain a lot of sugar and must be counted as carbs. What are tips for following this plan?  Reading food labels  Start by checking the serving size on the "Nutrition Facts" label of packaged foods and drinks. The amount of calories, carbs, fats, and other nutrients listed on the label is based on one serving of the item. Many items contain more than one serving per package.  Check the total grams (g) of carbs in one serving. You can calculate the number of servings of carbs in one serving by dividing the total carbs by 15. For example, if a food has 30 g of total carbs, it would be equal to 2 servings  of carbs.  Check the number of grams (g) of saturated and trans fats in one serving. Choose foods that have low or no amount of these fats.  Check the number of milligrams (mg) of salt (sodium) in one serving. Most people should limit total sodium intake to less than 2,300 mg per day.  Always check the nutrition information of foods labeled as "low-fat" or "nonfat". These foods may be higher in added sugar or refined carbs and should be avoided.  Talk to your dietitian to identify your daily goals  for nutrients listed on the label. Shopping  Avoid buying canned, premade, or processed foods. These foods tend to be high in fat, sodium, and added sugar.  Shop around the outside edge of the grocery store. This includes fresh fruits and vegetables, bulk grains, fresh meats, and fresh dairy. Cooking  Use low-heat cooking methods, such as baking, instead of high-heat cooking methods like deep frying.  Cook using healthy oils, such as olive, canola, or sunflower oil.  Avoid cooking with butter, cream, or high-fat meats. Meal planning  Eat meals and snacks regularly, preferably at the same times every day. Avoid going long periods of time without eating.  Eat foods high in fiber, such as fresh fruits, vegetables, beans, and whole grains. Talk to your dietitian about how many servings of carbs you can eat at each meal.  Eat 4-6 ounces (oz) of lean protein each day, such as lean meat, chicken, fish, eggs, or tofu. One oz of lean protein is equal to: ? 1 oz of meat, chicken, or fish. ? 1 egg. ?  cup of tofu.  Eat some foods each day that contain healthy fats, such as avocado, nuts, seeds, and fish. Lifestyle  Check your blood glucose regularly.  Exercise regularly as told by your health care provider. This may include: ? 150 minutes of moderate-intensity or vigorous-intensity exercise each week. This could be brisk walking, biking, or water aerobics. ? Stretching and doing strength exercises, such as yoga or weightlifting, at least 2 times a week.  Take medicines as told by your health care provider.  Do not use any products that contain nicotine or tobacco, such as cigarettes and e-cigarettes. If you need help quitting, ask your health care provider.  Work with a Veterinary surgeon or diabetes educator to identify strategies to manage stress and any emotional and social challenges. Questions to ask a health care provider  Do I need to meet with a diabetes educator?  Do I need to meet  with a dietitian?  What number can I call if I have questions?  When are the best times to check my blood glucose? Where to find more information:  American Diabetes Association: diabetes.org  Academy of Nutrition and Dietetics: www.eatright.[employer] of Diabetes and Digestive and Kidney Diseases (NIH): CarFlippers.tn Summary  A healthy meal plan will help you control your blood glucose and maintain a healthy lifestyle.  Working with a diet and nutrition specialist (dietitian) can help you make a meal plan that is best for you.  Keep in mind that carbohydrates (carbs) and alcohol have immediate effects on your blood glucose levels. It is important to count carbs and to use alcohol carefully. This information is not intended to replace advice given to you by your health care provider. Make sure you discuss any questions you have with your health care provider. Document Released: 09/03/2005 Document Revised: 11/19/2017 Document Reviewed: 01/11/2017 Elsevier Patient Education  2020 [employer].

## 2019-09-13 ENCOUNTER — Other Ambulatory Visit: Payer: Self-pay | Admitting: Internal Medicine

## 2019-09-13 LAB — HEMOGLOBIN A1C
Est. average glucose Bld gHb Est-mCnc: 160 mg/dL
Hgb A1c MFr Bld: 7.2 % — ABNORMAL HIGH (ref 4.8–5.6)

## 2019-09-13 MED ORDER — METFORMIN HCL 500 MG PO TABS: ORAL_TABLET | ORAL | 3 refills | Status: DC

## 2019-09-15 NOTE — Progress Notes (Signed)
Patient notified of results & recommendations. Expressed understanding. States that he had been taking Metformin 500 mg BID. Notified him of new dosage & he's aware that new prescription is at the pharmacy.

## 2019-09-25 ENCOUNTER — Telehealth: Payer: Self-pay | Admitting: Internal Medicine

## 2019-09-25 NOTE — Telephone Encounter (Signed)
Beth from [employer] called to request an update on a form she faxed/Mailed over regarding patients o2 equipment. Please follow up as soon as possible  *[phone] p

## 2019-09-25 NOTE — Telephone Encounter (Signed)
Updated Beth about the form. Form can not be completed until patient comes in for follow up on 10/17/2019. Patient should be wearing oxygen constantly. Patient is still supposed to be out of work. She states that patient was at work this weekend.  She states that she will be sending over a form for completion stating that patient should remain out of work until re-evaluation.

## 2019-09-27 ENCOUNTER — Telehealth: Payer: Self-pay | Admitting: Internal Medicine

## 2019-09-28 NOTE — Telephone Encounter (Signed)
Left voice mail to call back 

## 2019-10-04 ENCOUNTER — Ambulatory Visit: Payer: 59 | Admitting: Adult Health

## 2019-10-04 NOTE — Progress Notes (Deleted)
Cardiology Office Note   Date:  10/04/2019   ID:  Edwin Mcclain, DOB [birth year], MRN [id number]  PCP:  Ladell Pier, MD  Cardiologist: Dr. Percival Spanish No chief complaint on file.    History of Present Illness: Edwin Mcclain is a 53 y.o. male who presents for ongoing assessment and management of chronic diastolic heart failure.  The patient has a history of medical noncompliance, chronic renal insufficiency.  He had been hospitalized in July 2020 with decompensation complicated by acute renal insufficiency and respiratory failure.  Other history includes hypercholesterolemia, hypertension, non-insulin-dependent diabetes and super morbid obesity. He was last seen by Dr. Percival Spanish on 08/30/2019 at which time he was medically compliant, he was not having any complaints of shortness of breath or edema.    He had recently had a sleep study, which was positive for severe sleep apnea.  He was to be on AutoPap and O2 at 3 L.  During last office visit the patient was well compensated, without complaints, and did not have any medication changes or testing planned.  The patient was to remain on O2 continuously.  He did follow-up with PCP on 09/12/2019 and was advised to stay out of work until respiratory management had been optimized.  Apparently the patient went back to work [place].   Past Medical History:  Diagnosis Date  . Acute diastolic HF (heart failure) ([place]) 02/08/2019  . Acute respiratory failure with hypoxia ([place]) 02/08/2019  . HTN (hypertension)   . Morbid obesity ([place])     Past Surgical History:  Procedure Laterality Date  . C-spine surgery       Current Outpatient Medications  Medication Sig Dispense Refill  . aspirin 81 MG EC tablet Take 1 tablet (81 mg total) by mouth daily. 30 tablet   . atorvastatin (LIPITOR) 10 MG tablet Take 1 tablet (10 mg total) by mouth daily. 90 tablet 3  . blood glucose meter kit and supplies Dispense based on patient and insurance  preference. Check sugar once daily or prn. (FOR ICD-10 E10.9, E11.9). 1 each 0  . carvedilol (COREG) 12.5 MG tablet Take 1 tablet (12.5 mg total) by mouth 2 (two) times daily with a meal. 60 tablet 3  . iron polysaccharides (NU-IRON) 150 MG capsule Take 1 capsule by mouth daily.    Marland Kitchen losartan (COZAAR) 25 MG tablet Take 1 tablet (25 mg total) by mouth daily. 30 tablet 6  . Melatonin 10 MG TABS Take 1 tablet by mouth at bedtime as needed.    . meloxicam (MOBIC) 15 MG tablet Take 1 tablet (15 mg total) by mouth daily. 30 tablet 0  . metFORMIN (GLUCOPHAGE) 500 MG tablet 2 tabs PO Q a.m and 1 tab Q p.m 270 tablet 3  . torsemide (DEMADEX) 20 MG tablet Take 1 tablet (20 mg total) by mouth 2 (two) times daily. 60 tablet 3   No current facility-administered medications for this visit.     Allergies:   Patient has no known allergies.    Social History:  The patient  reports that he has never smoked. He has never used smokeless tobacco. He reports that he does not drink alcohol or use drugs.   Family History:  The patient's family history includes Cancer in his mother; Leukemia in his father.    ROS: All other systems are reviewed and negative. Unless otherwise mentioned in H&P    PHYSICAL EXAM: VS:  There were no vitals taken for this visit. , BMI There is no  height or weight on file to calculate BMI. GEN: Well nourished, well developed, in no acute distress HEENT: normal Neck: no JVD, carotid bruits, or masses Cardiac: ***RRR; no murmurs, rubs, or gallops,no edema  Respiratory:  Clear to auscultation bilaterally, normal work of breathing GI: soft, nontender, nondistended, + BS MS: no deformity or atrophy Skin: warm and dry, no rash Neuro:  Strength and sensation are intact Psych: euthymic mood, full affect   EKG:  EKG {ACTION; IS/IS DEY:[id number]} ordered today. The ekg ordered today demonstrates ***   Recent Labs: 02/07/2019: B Natriuretic Peptide 199.7 02/18/2019: Hemoglobin 13.1;  Platelets 223 03/07/2019: Magnesium 2.0 05/29/2019: ALT 14 07/27/2019: BUN 10; Creatinine, Ser 0.81; Potassium 4.6; Sodium 142    Lipid Panel    Component Value Date/Time   CHOL 165 05/29/2019 0953   TRIG 113 05/29/2019 0953   HDL 44 05/29/2019 0953   CHOLHDL 3.8 05/29/2019 0953   LDLCALC 98 05/29/2019 0953      Wt Readings from Last 3 Encounters:  09/12/19 (!) 379 lb (171.9 kg)  08/24/19 (!) 383 lb (173.7 kg)  08/18/19 (!) 369 lb (167.4 kg)      Other studies Reviewed: Echocardiogram 04-Mar-2019   1. The left ventricle has normal systolic function with an ejection fraction of 60-65%. The cavity size was normal. Left ventricular diastolic parameters were normal.  2. The right ventricle has normal systolic function. The cavity was mildly enlarged. There is no increase in right ventricular wall thickness.  3. The mitral valve is normal in structure.  4. The tricuspid valve is normal in structure.  5. The aortic valve was not well visualized.  6. The pulmonic valve was normal in structure.    ASSESSMENT AND PLAN:  1.  ***   Current medicines are reviewed at length with the patient today.    Labs/ tests ordered today include: *** Phill Myron. [place] Pugh, ANP, AACC   10/04/2019 7:31 AM    [facility] [address] [address] Office [phone] Fax [phone]  Notice: This dictation was prepared with Dragon dictation along with smaller phrase technology. Any transcriptional errors that result from this process are unintentional and may not be corrected upon review.

## 2019-10-04 NOTE — Telephone Encounter (Signed)
Patient aware & letter has been faxed to [facility].

## 2019-10-09 ENCOUNTER — Telehealth: Payer: Self-pay

## 2019-10-09 NOTE — Telephone Encounter (Signed)
Called patient to do their pre-visit COVID screening.  Call went to voicemail. Unable to do prescreening.  

## 2019-10-10 ENCOUNTER — Ambulatory Visit: Payer: 59

## 2019-10-10 NOTE — Progress Notes (Deleted)
Patient here for RTW evaluation. Also has paperwork that needs to be completed for his job.

## 2019-10-16 ENCOUNTER — Telehealth: Payer: Self-pay

## 2019-10-16 NOTE — Telephone Encounter (Signed)
Called patient to do their pre-visit COVID screening.  Have you tested positive for COVID or are you currently waiting for COVID test results? no  Have you recently traveled internationally([place], [place], [place], [place], [place]) or within the [place] to a hotspot area([place], [place], [place], [place], [place])? no  Are you currently experiencing any of the following symptoms: fever, cough, SHOB, fatigue, body aches, loss of smell/taste, rash, diarrhea, vomiting, severe headaches, weakness, sore throat? Diarrhea since increasing dose of Metformin.  Have you been in contact with anyone who has recently travelled? no  Have you been in contact with anyone who is experiencing any of the above symptoms or been diagnosed with [place]  or works in or has recently visited a SNF? no

## 2019-10-17 ENCOUNTER — Ambulatory Visit (INDEPENDENT_AMBULATORY_CARE_PROVIDER_SITE_OTHER): Payer: 59 | Admitting: Internal Medicine

## 2019-10-17 ENCOUNTER — Other Ambulatory Visit: Payer: Self-pay

## 2019-10-17 VITALS — BP 165/95 | HR 93 | Temp 97.3°F | Resp 18 | Wt 385.2 lb

## 2019-10-17 DIAGNOSIS — J9611 Chronic respiratory failure with hypoxia: Secondary | ICD-10-CM

## 2019-10-17 DIAGNOSIS — I5032 Chronic diastolic (congestive) heart failure: Secondary | ICD-10-CM | POA: Diagnosis not present

## 2019-10-17 DIAGNOSIS — G4733 Obstructive sleep apnea (adult) (pediatric): Secondary | ICD-10-CM | POA: Diagnosis not present

## 2019-10-17 DIAGNOSIS — E119 Type 2 diabetes mellitus without complications: Secondary | ICD-10-CM

## 2019-10-17 MED ORDER — ONETOUCH VERIO W/DEVICE KIT: PACK | 0 refills | Status: DC

## 2019-10-17 MED ORDER — ONETOUCH DELICA LANCING DEV MISC: 0 refills | Status: DC

## 2019-10-17 MED ORDER — ONETOUCH DELICA LANCETS 30G MISC: 1.0000 | Freq: Every day | 1 refills | Status: DC

## 2019-10-17 MED ORDER — SITAGLIPTIN PHOSPHATE 25 MG PO TABS: 25.0000 mg | ORAL_TABLET | Freq: Every day | ORAL | 4 refills | Status: DC

## 2019-10-17 MED ORDER — METFORMIN HCL 500 MG PO TABS: 500.0000 mg | ORAL_TABLET | Freq: Two times a day (BID) | ORAL | 3 refills | Status: DC

## 2019-10-17 NOTE — Patient Instructions (Addendum)
Decrease Metformin back to 500 mg twice a day.  Let me know if the diarrhea continues. Start Januvia 25 mg daily to help better control your diabetes.  I have sent a prescription to your pharmacy for diabetic testing supplies.

## 2019-10-17 NOTE — Progress Notes (Signed)
Patient ID: Edwin Mcclain, male    DOB: [birth year]  MRN: [id number]  CC: RTW Evaluation   Subjective: Edwin Mcclain is a 53 y.o. male who presents for return to work eval His concerns today include:  Patient with history of HTN,HL,CHF(echo 01/2019 60 to 65%), morbid obesity, DM, chronic respiratory failure with hypoxia  We have kept him out of work since his hospitalization back in August for decompensated CHF.  We saw him in follow-up 1 month ago for [place] to recheck his oxygen level on his O2 while wearing a mask as he would have to wear a mask at work.  However patient came to that appointment without his oxygen and was hypoxic. -Tried to go back to work earlier this mth.  Worked 2 days.  "It did not go well."  He worked 12-hour shifts and had to stand and do some walking the entire time.  He also wore his oxygen and a face mass.  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  He found that he was exhausted very early on shift and was finding it, to breath especially with the mask on.  Does not feel that he would be able to return to work even with reducing hours which he states is not an option for him with his current employer. Hrs already reduced to 36 hrs (three 12 hr shifts).  Told by his employer that they do not need any one on the 8 hr shift.  Other option is to reduce to 32 hs/wk.  He feels he can survive financially on 32 hrs a wk.  Wants to apply for long term disability.  He tells me he is not sure how he would support himself in the interim while he goes through the disability process.  CHF/HTN:  Did not take meds as yet for this a.m because he was rushing to get to this appointment.  He reports compliance with taking his medications.  No LE edema, no inc SOB, PND or orthopnea Has a scale at home but not wgh self regularly He has not receive a call in regards his  CPAP/Autopap machine as yet.  We had sent a rxn to [facility] several wks ago  DM:  Last A1C was 7.2.  Metformin inc from 500 mg BID to 1 grm a.m and 500 mg in p.m.  The inc dose caused diarrhea Not checking BS and does not have a devise.  He would like to have one.   He has not received a call as yet for his eye exam.   Patient Active Problem List   Diagnosis Date Noted  . OSA (obstructive sleep apnea) 09/12/2019  . Overgrown toenails 09/12/2019  . Acute on chronic diastolic HF (heart failure) ([place]) 08/24/2019  . Snoring 08/24/2019  . AKI (acute kidney injury) ([place]) 08/24/2019  . Type 2 diabetes mellitus with complication, without long-term current use of insulin ([place]) 08/24/2019  . Acute on chronic respiratory failure with hypoxia and hypercapnia ([place]) 07/14/2019  . Acute CHF (congestive heart failure) ([place]) 02/08/2019  . Acute respiratory failure with hypoxia ([place]) 02/08/2019  . HTN (hypertension)   . Morbid obesity ([place])      Current Outpatient Medications on File Prior to Visit  Medication Sig Dispense Refill  . aspirin 81 MG EC tablet Take 1 tablet (81 mg total) by mouth  daily. 30 tablet   . atorvastatin (LIPITOR) 10 MG tablet Take 1 tablet (10 mg total) by mouth daily. 90 tablet 3  . blood glucose meter kit and supplies Dispense based on patient and insurance preference. Check sugar once daily or prn. (FOR ICD-10 E10.9, E11.9). 1 each 0  . carvedilol (COREG) 12.5 MG tablet Take 1 tablet (12.5 mg total) by mouth 2 (two) times daily with a meal. 60 tablet 3  . iron polysaccharides (NU-IRON) 150 MG capsule Take 1 capsule by mouth daily.    Marland Kitchen losartan (COZAAR) 25 MG tablet Take 1 tablet (25 mg total) by mouth daily. 30 tablet 6  . Melatonin 10 MG TABS Take 1 tablet by mouth at bedtime as needed.    . meloxicam (MOBIC) 15 MG tablet Take 1 tablet (15 mg total) by mouth daily. 30 tablet 0  . metFORMIN (GLUCOPHAGE) 500 MG tablet 2 tabs PO Q a.m and 1 tab Q p.m 270 tablet 3  . torsemide  (DEMADEX) 20 MG tablet Take 1 tablet (20 mg total) by mouth 2 (two) times daily. 60 tablet 3   No current facility-administered medications on file prior to visit.     No Known Allergies  Social History   Socioeconomic History  . Marital status: Single    Spouse name: Not on file  . Number of children: Not on file  . Years of education: Not on file  . Highest education level: Not on file  Occupational History  . Not on file  Social Needs  . Financial resource strain: Not on file  . Food insecurity    Worry: Not on file    Inability: Not on file  . Transportation needs    Medical: Not on file    Non-medical: Not on file  Tobacco Use  . Smoking status: Never Smoker  . Smokeless tobacco: Never Used  Substance and Sexual Activity  . Alcohol use: Never    Frequency: Never  . Drug use: Never  . Sexual activity: Not on file  Lifestyle  . Physical activity    Days per week: Not on file    Minutes per session: Not on file  . Stress: Not on file  Relationships  . Social Herbalist on phone: Not on file    Gets together: Not on file    Attends religious service: Not on file    Active member of club or organization: Not on file    Attends meetings of clubs or organizations: Not on file    Relationship status: Not on file  . Intimate partner violence    Fear of current or ex partner: Not on file    Emotionally abused: Not on file    Physically abused: Not on file    Forced sexual activity: Not on file  Other Topics Concern  . Not on file  Social History Narrative   Lives with cousin for now.  Works at [employer].      Family History  Problem Relation Age of Onset  . Cancer Mother        Patient is not sure which type of cancer  . Leukemia Father     Past Surgical History:  Procedure Laterality Date  . C-spine surgery      ROS: Review of Systems Negative except as stated above  PHYSICAL EXAM: BP (!) 165/95   Pulse 93   Temp (!) 97.3 F (36.3 C)  (Temporal)   Resp 18  Wt (!) 385 lb 3.2 oz (174.7 kg)   SpO2 94% Comment: on 3 LPM O2  BMI 53.72 kg/m  Wt Readings from Last 3 Encounters:  10/17/19 (!) 385 lb 3.2 oz (174.7 kg)  09/12/19 (!) 379 lb (171.9 kg)  08/24/19 (!) 383 lb (173.7 kg)  Pox 93-94% with ambulation on 3 L/min.  Patient made 3 laps around the nurses station.  He was wearing his face mask.  He appeared winded.  Physical Exam General appearance - alert, well appearing, obese African-American male and in no distress Mental status - alert, oriented to person, place, and time Neck - supple, no significant adenopathy Chest -sounds slightly decreased but without crackles or wheezes Heart - normal rate, regular rhythm, normal S1, S2, no murmurs, rubs, clicks or gallops Extremities -trace edema of the lower legs.  Nonpitting edema of the ankles and feet  Lab Results  Component Value Date   HGBA1C 7.2 (H) 09/12/2019    CMP Latest Ref Rng & Units 07/27/2019 05/29/2019 03/07/2019  Glucose 65 - 99 mg/dL 111(H) 115(H) 83  BUN 6 - 24 mg/dL '10 11 10  ' Creatinine 0.76 - 1.27 mg/dL 0.81 0.89 1.03  Sodium 134 - 144 mmol/L 142 142 141  Potassium 3.5 - 5.2 mmol/L 4.6 4.3 4.2  Chloride 96 - 106 mmol/L 95(L) 103 100  CO2 20 - 29 mmol/L 33(H) 29 25  Calcium 8.7 - 10.2 mg/dL 9.2 8.7 9.4  Total Protein 6.0 - 8.5 g/dL - 6.8 7.0  Total Bilirubin 0.0 - 1.2 mg/dL - 0.3 <0.2  Alkaline Phos 39 - 117 IU/L - 83 91  AST 0 - 40 IU/L - 17 15  ALT 0 - 44 IU/L - 14 19   Lipid Panel     Component Value Date/Time   CHOL 165 05/29/2019 0953   TRIG 113 05/29/2019 0953   HDL 44 05/29/2019 0953   CHOLHDL 3.8 05/29/2019 0953   LDLCALC 98 05/29/2019 0953    CBC    Component Value Date/Time   WBC 3.7 (L) 02/18/2019 0424   RBC 6.38 (H) 02/18/2019 0424   HGB 13.1 02/18/2019 0424   HCT 44.8 02/18/2019 0424   PLT 223 02/18/2019 0424   MCV 70.2 (L) 02/18/2019 0424   MCH 20.5 (L) 02/18/2019 0424   MCHC 29.2 (L) 02/18/2019 0424   RDW 21.3 (H)  02/18/2019 0424   LYMPHSABS 1.2 02/11/2019 0759   MONOABS 0.4 02/11/2019 0759   EOSABS 0.1 02/11/2019 0759   BASOSABS 0.0 02/11/2019 0759    ASSESSMENT AND PLAN: 1. Chronic respiratory failure with hypoxia (HCC) 2. Chronic diastolic congestive heart failure ([place]) -I think it is reasonable for him to apply for disability given his diagnosis, oxygen dependent status and poor physical endurance.  Advised patient to contact [place] office and find out the paperwork that he will need to fill out.  I can write a letter in support of him getting disability and can also have our caseworker see if likely will be willing to work with him. -I also gave him a letter for his employer letting them know that he will not be returning to work due to his medical condition. After he had left, I received a form from [facility] requesting information as the patient submitted a claim for disability benefits from them. -Patient to continue his current medications that include Cozaar, torsemide, carvedilol.  3. OSA (obstructive sleep apnea) I have asked my CMA to contact [facility] to see whether they got the prescription  for his AutoPap device and inquire when will it be delivered  4. Type 2 diabetes mellitus without complication, without long-term current use of insulin (HCC) Decrease Metformin back to 500 mg twice a day.  Wilder Glade would be a good medication to add given his history of CHF however it appears his insurance does not cover [place] or [place].  We will add a low-dose of Januvia instead -Prescription sent to his pharmacy for diabetic testing supplies.  Advised to check blood sugars once a day. - sitaGLIPtin (JANUVIA) 25 MG tablet; Take 1 tablet (25 mg total) by mouth daily.  Dispense: 30 tablet; Refill: 4 - Basic Metabolic Panel   Patient was given the opportunity to ask questions.  Patient verbalized understanding of the plan and was able to repeat key elements of the plan.   Orders Placed This  Encounter  Procedures  . Basic Metabolic Panel     Requested Prescriptions   Signed Prescriptions Disp Refills  . sitaGLIPtin (JANUVIA) 25 MG tablet 30 tablet 4    Sig: Take 1 tablet (25 mg total) by mouth daily.  . Blood Glucose Monitoring Suppl (ONETOUCH VERIO) w/Device KIT 1 kit 0    Sig: Use daily to check blood sugars. Dx: E10.9, E11.9  . Lancet Devices (ONE TOUCH DELICA LANCING DEV) MISC 1 each 0    Sig: Use as directed. Dx: E10.9, E11.9  . OneTouch Delica Lancets 74A MISC 100 each 1    Sig: 1 each by Does not apply route daily. Dx: E10.9, E11.9    Return in about 2 months (around 12/17/2019).  Karle Plumber, MD, FACP

## 2019-10-17 NOTE — Progress Notes (Signed)
Patient here for RTW evaluation. Has O2 tank with him, set on 3 LPM.  Did not take any of his medications this morning.  Patient also states that since increasing the dose of his Metformin he has had GI upset & diarrhea.

## 2019-10-18 LAB — BASIC METABOLIC PANEL
BUN/Creatinine Ratio: 13 (ref 9–20)
BUN: 11 mg/dL (ref 6–24)
CO2: 32 mmol/L — ABNORMAL HIGH (ref 20–29)
Calcium: 9.3 mg/dL (ref 8.7–10.2)
Chloride: 98 mmol/L (ref 96–106)
Creatinine, Ser: 0.88 mg/dL (ref 0.76–1.27)
GFR calc Af Amer: 114 mL/min/{1.73_m2} (ref >59)
GFR calc non Af Amer: 99 mL/min/{1.73_m2} (ref >59)
Glucose: 114 mg/dL — ABNORMAL HIGH (ref 65–99)
Potassium: 4.5 mmol/L (ref 3.5–5.2)
Sodium: 143 mmol/L (ref 134–144)

## 2019-10-19 NOTE — Progress Notes (Signed)
Normal lab letter mailed.

## 2019-11-28 ENCOUNTER — Ambulatory Visit: Payer: 59

## 2019-12-04 ENCOUNTER — Ambulatory Visit: Payer: 59 | Admitting: Cardiovascular Disease

## 2019-12-05 ENCOUNTER — Ambulatory Visit: Payer: 59 | Admitting: Podiatry

## 2019-12-12 ENCOUNTER — Ambulatory Visit (INDEPENDENT_AMBULATORY_CARE_PROVIDER_SITE_OTHER): Payer: 59 | Admitting: Physician Assistant

## 2019-12-12 DIAGNOSIS — E119 Type 2 diabetes mellitus without complications: Secondary | ICD-10-CM | POA: Diagnosis not present

## 2019-12-12 DIAGNOSIS — I10 Essential (primary) hypertension: Secondary | ICD-10-CM | POA: Diagnosis not present

## 2019-12-12 MED ORDER — ASPIRIN 81 MG PO TBEC: 81.0000 mg | DELAYED_RELEASE_TABLET | Freq: Every day | ORAL | Status: AC

## 2019-12-12 MED ORDER — ATORVASTATIN CALCIUM 10 MG PO TABS: 10.0000 mg | ORAL_TABLET | Freq: Every day | ORAL | 0 refills | Status: DC

## 2019-12-12 MED ORDER — LOSARTAN POTASSIUM 25 MG PO TABS: 25.0000 mg | ORAL_TABLET | Freq: Every day | ORAL | 0 refills | Status: DC

## 2019-12-12 MED ORDER — METFORMIN HCL 500 MG PO TABS: 500.0000 mg | ORAL_TABLET | Freq: Two times a day (BID) | ORAL | 0 refills | Status: DC

## 2019-12-12 NOTE — Progress Notes (Signed)
Virtual Visit via Telephone Note  I connected with Edwin Mcclain on 12/12/19 at  3:10 PM EST by telephone and verified that I am speaking with the correct person using two identifiers.   I discussed the limitations, risks, security and privacy concerns of performing an evaluation and management service by telephone and the availability of in person appointments. I also discussed with the patient that there may be a patient responsible charge related to this service. The patient expressed understanding and agreed to proceed.  Patient location:  home My Location:  [place] office Persons on the call:  Me and the patient.      History of Present Illness:  Patient doing well.  He says BP and blood sugar are doing well.  Needs RF on meds.  He is not taking [place] that was discussed at his last visit and he is unable to give me any specific BP or glucose readings.  He denies any current concerns.  Denies s/sx hyperglycemia or hypoglycemia    Observations/Objective:  NAD   Assessment and Plan: 1. Essential hypertension continue - losartan (COZAAR) 25 MG tablet; Take 1 tablet (25 mg total) by mouth daily.  Dispense: 90 tablet; Refill: 0  2. Type 2 diabetes mellitus without complication, without long-term current use of insulin (HCC) RF metformin I have had a lengthy discussion and provided education about insulin resistance and the intake of too much sugar/refined carbohydrates.  I have advised the patient to work at a goal of eliminating sugary drinks, candy, desserts, sweets, refined sugars, processed foods, and [place] carbohydrates.  The patient expresses understanding.     Follow Up Instructions: See PCP in 6-8 weeks   I discussed the assessment and treatment plan with the patient. The patient was provided an opportunity to ask questions and all were answered. The patient agreed with the plan and demonstrated an understanding of the instructions.   The patient was advised to call back or  seek an in-person evaluation if the symptoms worsen or if the condition fails to improve as anticipated.  I provided 13 minutes of non-face-to-face time during this encounter.   Freeman Caldron, PA-C  Patient ID: Edwin Mcclain, male   DOB: 1966-11-20, 53 y.o.   MRN: [id number]

## 2020-02-05 ENCOUNTER — Other Ambulatory Visit: Payer: Self-pay | Admitting: Physician Assistant

## 2020-02-05 DIAGNOSIS — I10 Essential (primary) hypertension: Secondary | ICD-10-CM

## 2020-02-11 ENCOUNTER — Other Ambulatory Visit: Payer: Self-pay | Admitting: Internal Medicine

## 2020-02-11 DIAGNOSIS — I5032 Chronic diastolic (congestive) heart failure: Secondary | ICD-10-CM

## 2020-02-21 IMAGING — DX DG CHEST 2V
2 series · 2 of 2 positions shown · non-contrast
Comparison: None.

CLINICAL DATA: Patient states that he has cough x 2 weeks, sob,
center chest pain. Current social smoker. Hx of htn

EXAM:
CHEST - 2 VIEW

[chest pa]
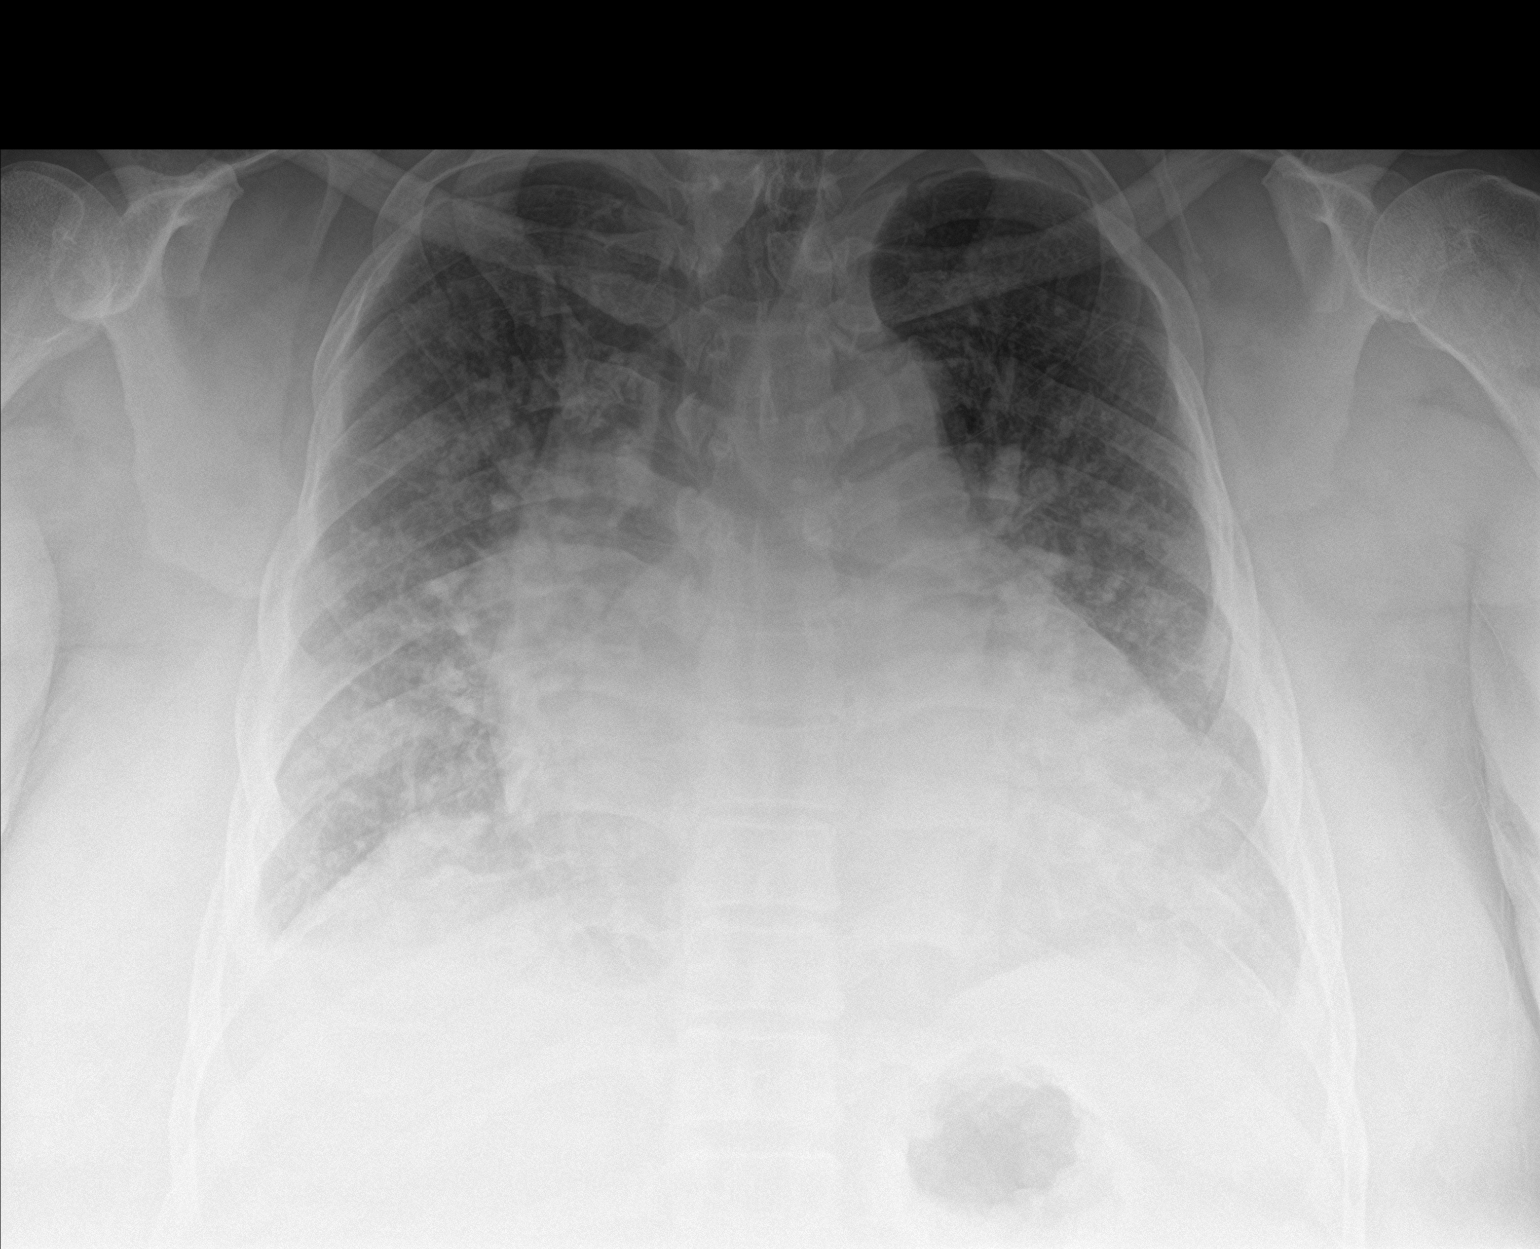

[chest lat]
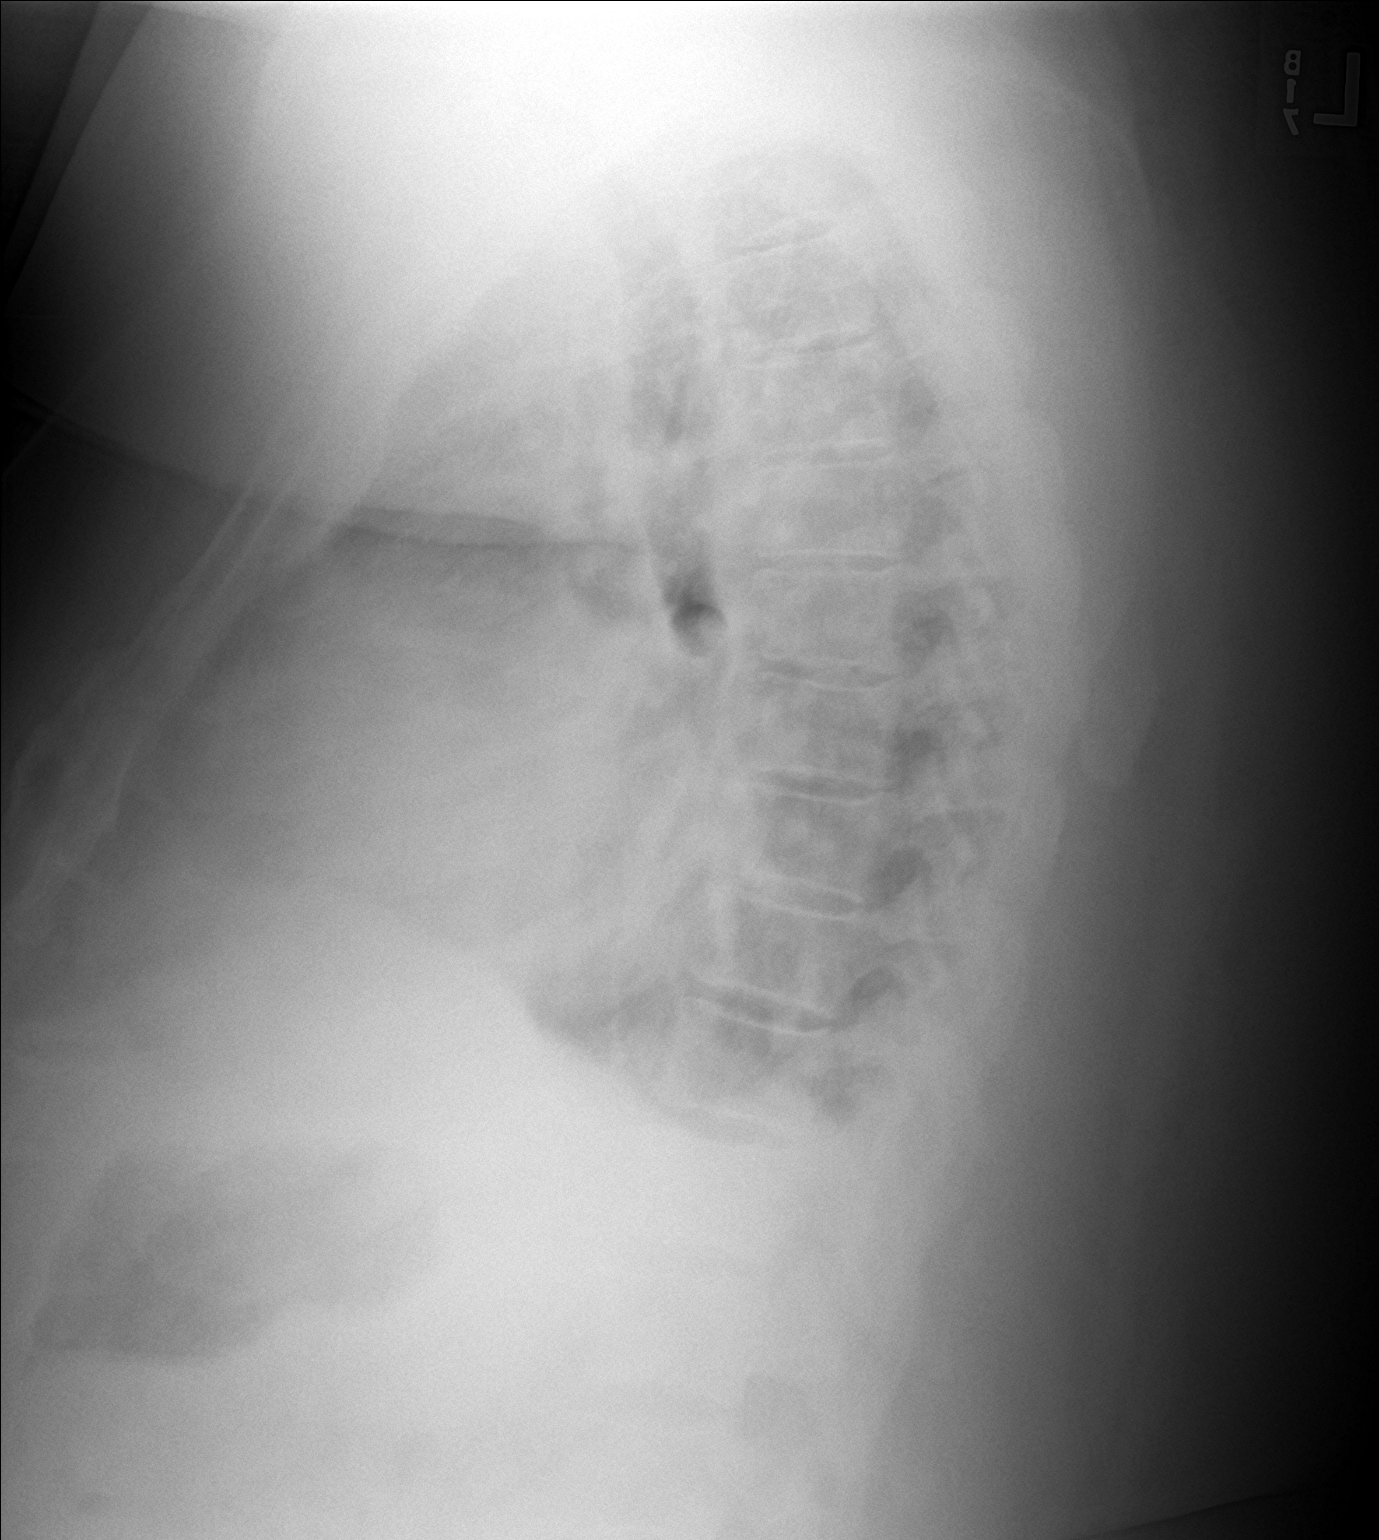

[2 of 2 positions shown; findings below may reference images not displayed]

FINDINGS: The heart is enlarged. There is marked pulmonary vascular congestion
and perihilar edema. Small bilateral pleural effusions are present.
No consolidations.
IMPRESSION: Cardiomegaly and pulmonary edema.

## 2020-04-29 ENCOUNTER — Telehealth (INDEPENDENT_AMBULATORY_CARE_PROVIDER_SITE_OTHER): Payer: 59 | Admitting: Internal Medicine

## 2020-04-29 ENCOUNTER — Encounter: Payer: Self-pay | Admitting: Internal Medicine

## 2020-04-29 DIAGNOSIS — M79652 Pain in left thigh: Secondary | ICD-10-CM | POA: Diagnosis not present

## 2020-04-29 MED ORDER — MELOXICAM 7.5 MG PO TABS: 7.5000 mg | ORAL_TABLET | Freq: Every day | ORAL | 0 refills | Status: DC

## 2020-04-29 NOTE — Progress Notes (Signed)
Virtual Visit via Telephone Note  I connected with [name], on 04/29/2020 at 10:30 AM by telephone due to the COVID-19 pandemic and verified that I am speaking with the correct person using two identifiers.   Consent: I discussed the limitations, risks, security and privacy concerns of performing an evaluation and management service by telephone and the availability of in person appointments. I also discussed with the patient that there may be a patient responsible charge related to this service. The patient expressed understanding and agreed to proceed.   Location of Patient: Home   Location of Provider: Clinic    Persons participating in Telemedicine visit: Edwin Mcclain [facility] Dr. Juleen China    History of Present Illness: Patient has a visit for left thigh pain. It has been a recurring issue. Has been occurring for almost a year. Present on the anterior aspect in the medial region of his left thigh. It hurts even at rest. Reports that some days are more severe than other days in terms of pain.   He was seen in Urgent Care on 3/31 and 03/28/19 and was having hip pain at that time in addition to his thigh. He was diagnosed with left hip greater trochanteric bursitis. He was given steroid injection at that time, patient reports that did not improve symptoms so he returned a week later. He was given Rx for Mobic which he reports did help for a short period of time.    Past Medical History:  Diagnosis Date  . Acute diastolic HF (heart failure) ([place]) 02/08/2019  . Acute respiratory failure with hypoxia ([place]) 02/08/2019  . HTN (hypertension)   . Morbid obesity ([place])    No Known Allergies  Current Outpatient Medications on File Prior to Visit  Medication Sig Dispense Refill  . aspirin 81 MG EC tablet Take 1 tablet (81 mg total) by mouth daily. 30 tablet   . atorvastatin (LIPITOR) 10 MG tablet TAKE 1 TABLET(10 MG) BY MOUTH DAILY 90 tablet 0  . carvedilol (COREG) 12.5 MG  tablet Take 1 tablet (12.5 mg total) by mouth 2 (two) times daily with a meal. 60 tablet 3  . iron polysaccharides (NU-IRON) 150 MG capsule Take 1 capsule by mouth daily.    Elmore Guise Devices (ONE TOUCH DELICA LANCING DEV) MISC Use as directed. Dx: E10.9, E11.9 1 each 0  . losartan (COZAAR) 25 MG tablet TAKE 1 TABLET(25 MG) BY MOUTH DAILY 90 tablet 0  . Melatonin 10 MG TABS Take 1 tablet by mouth at bedtime as needed.    . metFORMIN (GLUCOPHAGE) 500 MG tablet Take 1 tablet (500 mg total) by mouth 2 (two) times daily with a meal. 180 tablet 0  . OneTouch Delica Lancets 27O MISC 1 each by Does not apply route daily. Dx: E10.9, E11.9 100 each 1  . sitaGLIPtin (JANUVIA) 25 MG tablet Take 1 tablet (25 mg total) by mouth daily. 30 tablet 4  . torsemide (DEMADEX) 20 MG tablet TAKE 1 TABLET BY MOUTH TWICE DAILY 60 tablet 3   No current facility-administered medications on file prior to visit.    Observations/Objective: NAD. Speaking clearly.  Work of breathing normal.  Alert and oriented. Mood appropriate.   Assessment and Plan: 1. Left thigh pain Chronic. Difficult to assess over phone visit and discussed those limitations with patient. He reports the pain in his thigh. May be referred pain from trochanteric bursitis, may be chronic muscle strain, may be referred pain from undiagnosed hip OA. Certainly, BMI >53 is  likely contributing to whatever underlying pathology is causing his pain. Have recommended given chronicity, referral to SM. May benefit from ultrasound of the thigh and/or imaging of the hip pending examination. Will refill NSAID for pain control while he awaits appointment.  - Ambulatory referral to Sports Medicine - meloxicam (MOBIC) 7.5 MG tablet; Take 1 tablet (7.5 mg total) by mouth daily.  Dispense: 30 tablet; Refill: 0    Follow Up Instructions: SM follow up from referral, with PCP for routine medical care    I discussed the assessment and treatment plan with the patient. The  patient was provided an opportunity to ask questions and all were answered. The patient agreed with the plan and demonstrated an understanding of the instructions.   The patient was advised to call back or seek an in-person evaluation if the symptoms worsen or if the condition fails to improve as anticipated.      I provided 14 minutes total of non-face-to-face time during this encounter including median intraservice time, reviewing previous notes, investigations, ordering medications, medical decision making, coordinating care and patient verbalized understanding at the end of the visit.    Marcy Siren, D.O. Primary [facility] at [facility]  04/29/2020, 10:30 AM

## 2020-06-07 ENCOUNTER — Telehealth: Payer: Self-pay | Admitting: Cardiology

## 2020-06-07 NOTE — Telephone Encounter (Signed)
Edwin Mcclain from [facility] is calling in regards to two different fax's she has sent over and has not heard anything back in regards to it. She states she faxed the first on 04/22/20 and then faxed it over again on 05/22/20 due to not hearing back and still hasn't. Please advise.

## 2020-06-07 NOTE — Telephone Encounter (Signed)
Sent to medical records. 

## 2020-09-05 ENCOUNTER — Encounter: Payer: Self-pay | Admitting: Internal Medicine

## 2020-09-05 ENCOUNTER — Ambulatory Visit (INDEPENDENT_AMBULATORY_CARE_PROVIDER_SITE_OTHER): Payer: 59

## 2020-09-05 ENCOUNTER — Other Ambulatory Visit: Payer: Self-pay

## 2020-09-05 ENCOUNTER — Ambulatory Visit (INDEPENDENT_AMBULATORY_CARE_PROVIDER_SITE_OTHER): Payer: 59 | Admitting: Internal Medicine

## 2020-09-05 VITALS — BP 161/93 | HR 104 | Temp 97.5°F | Resp 20 | Wt >= 6400 oz

## 2020-09-05 DIAGNOSIS — G8929 Other chronic pain: Secondary | ICD-10-CM | POA: Diagnosis not present

## 2020-09-05 DIAGNOSIS — E119 Type 2 diabetes mellitus without complications: Secondary | ICD-10-CM | POA: Diagnosis not present

## 2020-09-05 DIAGNOSIS — E785 Hyperlipidemia, unspecified: Secondary | ICD-10-CM

## 2020-09-05 DIAGNOSIS — I10 Essential (primary) hypertension: Secondary | ICD-10-CM | POA: Diagnosis not present

## 2020-09-05 DIAGNOSIS — M25512 Pain in left shoulder: Secondary | ICD-10-CM | POA: Diagnosis not present

## 2020-09-05 DIAGNOSIS — Z23 Encounter for immunization: Secondary | ICD-10-CM | POA: Diagnosis not present

## 2020-09-05 DIAGNOSIS — J9611 Chronic respiratory failure with hypoxia: Secondary | ICD-10-CM | POA: Diagnosis not present

## 2020-09-05 DIAGNOSIS — E1169 Type 2 diabetes mellitus with other specified complication: Secondary | ICD-10-CM

## 2020-09-05 DIAGNOSIS — I5032 Chronic diastolic (congestive) heart failure: Secondary | ICD-10-CM

## 2020-09-05 DIAGNOSIS — Z1159 Encounter for screening for other viral diseases: Secondary | ICD-10-CM

## 2020-09-05 LAB — POCT GLYCOSYLATED HEMOGLOBIN (HGB A1C): Hemoglobin A1C: 7.1 % — AB (ref 4.0–5.6)

## 2020-09-05 LAB — GLUCOSE, POCT (MANUAL RESULT ENTRY): POC Glucose: 127 mg/dl — AB (ref 70–99)

## 2020-09-05 MED ORDER — SITAGLIPTIN PHOSPHATE 25 MG PO TABS: 25.0000 mg | ORAL_TABLET | Freq: Every day | ORAL | 2 refills | Status: DC

## 2020-09-05 NOTE — Progress Notes (Signed)
Subjective:    Edwin Mcclain - 54 y.o. male MRN [id number]  Date of birth: [birth year]  HPI  Edwin Mcclain is here for follow up. Patient with history of HTN,HL,CHF(echo 01/2019 60 to 65%), morbid obesity, DM,chronic respiratory failure with hypoxia   Chronic HTN Disease Monitoring:  Home BP Monitoring - Does not monitor  Chest pain- no  Dyspnea- no Headache - no  Medications: Losartan 25 mg, Coreg 12.5 mg BID,  Torsemide 20 mg BID Compliance- yes Lightheadedness- no  Edema- no     Diabetes mellitus, Type 2 Disease Monitoring             Blood Sugar Ranges: Fasting - 110-150             Polyuria: no             Visual problems: no   Urine Microalbumin : collected today---on Arb   Last A1C: 7.2 (Sept 2020)   Medications: Metformin 500 mg BID, Januvia 25 mg daily   Medication Compliance: no, not taking Januvia   Medication Side Effects             Hypoglycemia: no  Reports lots of diarrhea with Metformin.    Complains of chronic left shoulder pain for several months with limited ROM. Unable to raise his shoulder above shoulder height. Feels very stiff and achy. No known injury.    Health Maintenance:  Health Maintenance Due  Topic Date Due   Hepatitis C Screening  Never done   OPHTHALMOLOGY EXAM  Never done   COVID-19 Vaccine (1) Never done   Fecal DNA (Cologuard)  Never done    -  reports that he has never smoked. He has never used smokeless tobacco. - Review of Systems: Per HPI. - Past Medical History: Patient Active Problem List   Diagnosis Date Noted   OSA (obstructive sleep apnea) 09/12/2019   Overgrown toenails 09/12/2019   Acute on chronic diastolic HF (heart failure) (HCC) 08/24/2019   Snoring 08/24/2019   AKI (acute kidney injury) (HCC) 08/24/2019   Type 2 diabetes mellitus with complication, without long-term current use of insulin (HCC) 08/24/2019   Acute on chronic respiratory failure with hypoxia and hypercapnia  (HCC) 07/14/2019   Acute CHF (congestive heart failure) (HCC) 02/08/2019   Acute respiratory failure with hypoxia (HCC) 02/08/2019   HTN (hypertension)    Morbid obesity (HCC)    - Medications: reviewed and updated   Objective:   Physical Exam BP (!) 161/93    Pulse (!) 104    Temp (!) 97.5 F (36.4 C) (Temporal)    Resp 20    Wt (!) 406 lb (184.2 kg)    SpO2 (!) 82%    BMI 56.63 kg/m  Physical Exam Constitutional:      General: He is not in acute distress.    Appearance: He is not diaphoretic.  HENT:     Head: Normocephalic and atraumatic.  Eyes:     Conjunctiva/sclera: Conjunctivae normal.  Cardiovascular:     Rate and Rhythm: Normal rate and regular rhythm.     Heart sounds: Normal heart sounds. No murmur heard.   Pulmonary:     Effort: Pulmonary effort is normal. No respiratory distress.     Breath sounds: Normal breath sounds.     Comments: [place] in place with comfortable WOB.  Musculoskeletal:        General: Normal range of motion.     Comments: Left shoulder with limited active and passive ROM beyond  about 90-100 degrees of abduction.   Skin:    General: Skin is warm and dry.  Neurological:     Mental Status: He is alert and oriented to person, place, and time.  Psychiatric:        Mood and Affect: Affect normal.        Judgment: Judgment normal.            Assessment & Plan:   1. Chronic respiratory failure with hypoxia (HCC) Patient initially presenting without his portable O2 tank with saturations in the 70s. When connected to home 3L in rhe office was satting appropriately at around 96%. Comfortable WOB and lung exam non-focal. Given letter that can not wear mask as unable to fit over his [place] and it is leading to decreased compliance with O2. Encouraged to maintain social distancing and avoid unnecessary trips to limit potential exposures.   2. Chronic diastolic congestive heart failure (HCC)   3. Type 2 diabetes mellitus without complication, without  long-term current use of insulin (HCC) A1c 7.1% showing good glycemic control. D/c Metformin due to side effects and start Januvia.  Counseled on Diabetic diet, my plate method, 010 minutes of moderate intensity exercise/week Blood sugar logs with fasting goals of 80-120 mg/dl, random of less than 932 and in the event of sugars less than 60 mg/dl or greater than 355 mg/dl encouraged to notify the clinic. Advised on the need for annual eye exams, annual foot exams, Pneumonia vaccine. - Glucose (CBG) - HgB A1c - Microalbumin/Creatinine Ratio, Urine - HM Diabetes Foot Exam - Ambulatory referral to Ophthalmology - sitaGLIPtin (JANUVIA) 25 MG tablet; Take 1 tablet (25 mg total) by mouth daily.  Dispense: 90 tablet; Refill: 2 - Comprehensive metabolic panel; Future - Lipid Panel; Future  4. Essential hypertension BP above goal today. However, patient did not take any of his mediations since he is fasting. Encouraged continued compliance. Continue current regimen. Asymptomatic.  - CBC with Differential; Future - Comprehensive metabolic panel; Future - Lipid Panel; Future  5. Hyperlipidemia associated with type 2 diabetes mellitus (HCC) Last LDL 98. Goal should likely be <70 give DM and other co-morbidities. Continue Lipitor 10 mg. Check lipid panel and evaluate LDL and ASCVD risk to see if would benefit from higher intensity statin therapy.  - Lipid Panel; Future  6. Need for hepatitis C screening test - HCV Ab w/Rflx to Verification; Future  7. Needs flu sho - Flu Vaccine QUAD 6+ mos PF IM (Fluarix Quad PF)  8. Chronic left shoulder pain Exam is concerning for adhesive capsulitis given limited passive ROM with firm end point. Patient is at increased risk due to DM. May have component of rotator cuff injury or OA as well; hard to determine as exam limited due to pain and loss of functionality. Will obtain X-ray. Refer to ortho and PT.  - Ambulatory referral to Orthopedic Surgery -  Ambulatory referral to Physical Therapy - DG Shoulder Left; Future   Marcy Siren, D.O. 09/06/2020, 10:03 AM Primary Care at [facility]

## 2020-09-06 LAB — MICROALBUMIN / CREATININE URINE RATIO
Creatinine, Urine: 137.1 mg/dL
Microalb/Creat Ratio: 5 mg/g creat (ref 0–29)
Microalbumin, Urine: 6.5 ug/mL

## 2020-09-06 NOTE — Progress Notes (Signed)
Normal lab letter mailed.

## 2020-09-06 NOTE — Progress Notes (Signed)
Patient notified of results & recommendations. Expressed understanding.  Patient states that he has noticed a worsening in his breathing & occasional cough(nonproductive currently).  Please advise.

## 2020-09-27 ENCOUNTER — Ambulatory Visit: Payer: 59 | Admitting: Orthopaedic Surgery

## 2020-12-05 ENCOUNTER — Telehealth: Payer: 59 | Admitting: Internal Medicine

## 2021-01-23 ENCOUNTER — Telehealth: Payer: Self-pay | Admitting: Cardiology

## 2021-01-23 NOTE — Telephone Encounter (Signed)
Left message for patient to call back and schedule an office visit with Dr.Hochrein. Last visit was in 2020, we have received paperwork to complete but information is asking from march 2021 to present

## 2021-02-06 NOTE — Telephone Encounter (Signed)
Called and left message to inform patient that Dr.Hochrein could not fill out the paperwork that was sent from [facility], patients PCP would be the one to complete this paperwork.

## 2021-02-20 ENCOUNTER — Ambulatory Visit: Payer: 59 | Admitting: Internal Medicine

## 2021-02-26 ENCOUNTER — Telehealth: Payer: Self-pay | Admitting: Internal Medicine

## 2021-03-06 ENCOUNTER — Telehealth: Payer: Self-pay | Admitting: *Deleted

## 2021-03-06 NOTE — Telephone Encounter (Signed)
Spoke to patient to inform message per Dr. Earlene Plater regarding forms:  Need to see disability provider through social security .   Forms left at the front desk for patient to pick up.

## 2021-03-10 NOTE — Progress Notes (Signed)
Cardiology Office Note   Date:  03/11/2021   ID:  Sherrill, [place] [birth year], MRN [id number]  PCP:  Arvilla Market, DO  Cardiologist:   Rollene Rotunda, MD Referring: Leary Roca*  Chief Complaint  Patient presents with  . Edema     History of Present Illness: Edwin Mcclain is a 55 y.o. male who is referred by Leary Roca* for evaluation of acute on chronic diastolic HF.  Since I last saw him he has not been hospitalized.  He has had some personal issues that he is says he is working through.  Because of this he has gained some weight because is not been as active and has been stressed.  However, he denies any new shortness of breath, PND or orthopnea.  Has had no new palpitations, presyncope or syncope.  Past Medical History:  Diagnosis Date  . Acute diastolic HF (heart failure) (HCC) 02/08/2019  . Acute respiratory failure with hypoxia (HCC) 02/08/2019  . HTN (hypertension)   . Morbid obesity (HCC)     Past Surgical History:  Procedure Laterality Date  . C-spine surgery       Current Outpatient Medications  Medication Sig Dispense Refill  . aspirin 81 MG EC tablet Take 1 tablet (81 mg total) by mouth daily. 30 tablet   . atorvastatin (LIPITOR) 10 MG tablet TAKE 1 TABLET(10 MG) BY MOUTH DAILY 90 tablet 0  . carvedilol (COREG) 25 MG tablet Take 1 tablet (25 mg total) by mouth 2 (two) times daily with a meal. 60 tablet 6  . iron polysaccharides (NU-IRON) 150 MG capsule Take 1 capsule by mouth daily.    Dem[employer] (ONE TOUCH DELICA LANCING DEV) MISC Use as directed. Dx: E10.9, E11.9 1 each 0  . losartan (COZAAR) 25 MG tablet TAKE 1 TABLET(25 MG) BY MOUTH DAILY 90 tablet 0  . Melatonin 10 MG TABS Take 1 tablet by mouth at bedtime as needed.    . meloxicam (MOBIC) 7.5 MG tablet Take 1 tablet (7.5 mg total) by mouth daily. 30 tablet 0  . OneTouch Delica Lancets 30G MISC 1 each by Does not apply route daily. Dx: E10.9, E11.9 100 each 1   . sitaGLIPtin (JANUVIA) 25 MG tablet Take 1 tablet (25 mg total) by mouth daily. 90 tablet 2  . torsemide (DEMADEX) 20 MG tablet TAKE 1 TABLET BY MOUTH TWICE DAILY 60 tablet 3   No current facility-administered medications for this visit.    Allergies:   Patient has no known allergies.    ROS:  Please see the history of present illness.   Otherwise, review of systems are positive for none.   All other systems are reviewed and negative.    PHYSICAL EXAM: VS:  BP (!) 160/80   Pulse 98   Ht 5\' 11"  (1.803 m)   Wt (!) 405 lb (183.7 kg)   SpO2 94%   BMI 56.49 kg/m  , BMI Body mass index is 56.49 kg/m. GENERAL:  Well appearing NECK:  No jugular venous distention, waveform within normal limits, carotid upstroke brisk and symmetric, no bruits, no thyromegaly LUNGS:  Clear to auscultation bilaterally CHEST:  Unremarkable HEART:  PMI not displaced or sustained,S1 and S2 within normal limits, no S3, no S4, no clicks, no rubs, no murmurs ABD:  Flat, positive bowel sounds normal in frequency in pitch, no bruits, no rebound, no guarding, no midline pulsatile mass, no hepatomegaly, no splenomegaly EXT:  2 plus pulses throughout, moderate bilateral  lower extremity edema, no cyanosis no clubbing, chronic venous stasis changes   EKG:  EKG is  ordered today. The ekg ordered today demonstrates sinus rhythm, rate 98, right axis deviation, no acute ST-T wave changes.   Recent Labs: No results found for requested labs within last 8760 hours.    Lipid Panel    Component Value Date/Time   CHOL 165 05/29/2019 0953   TRIG 113 05/29/2019 0953   HDL 44 05/29/2019 0953   CHOLHDL 3.8 05/29/2019 0953   LDLCALC 98 05/29/2019 0953      Wt Readings from Last 3 Encounters:  03/11/21 (!) 405 lb (183.7 kg)  09/05/20 (!) 406 lb (184.2 kg)  10/17/19 (!) 385 lb 3.2 oz (174.7 kg)      Other studies Reviewed: Additional studies/ records that were reviewed today include: None Review of the above  records demonstrates:  Please see elsewhere in the note.     ASSESSMENT AND PLAN:  ACUTE ON CHRONIC DIASTOLIC HF:     He seems to be euvolemic.  No change in therapy other than below.  HTN:  His blood pressure is not at target.  I am going to increase his carvedilol to 25 mg twice daily.   AKI:    He is due to see his primary care physician on the 31st and should have a basic metabolic profile at that time.   SLEEP APNEA: He had a sleep apnea study ordered by another physician a couple of years ago when I was able to see that this was severe sleep apnea but he does not remember getting a phone call about this.  I will set him up to see Dr. Tresa Endo.   DM: A1c is 7.1 in August next year and can be repeated as indicated by his primary provider.   SUPER MORBID OBESITY: We had another discussion about diet and I suggested no sugar drinks and cutting out bread and I think he is ready to try this.     Current medicines are reviewed at length with the patient today.  The patient does not have concerns regarding medicines.  The following changes have been made: As above  Labs/ tests ordered today include: None  Orders Placed This Encounter  Procedures  . EKG 12-Lead     Disposition:   FU with APP in six months.      Signed, Rollene Rotunda, MD  03/11/2021 5:23 PM    [facility]

## 2021-03-11 ENCOUNTER — Ambulatory Visit (INDEPENDENT_AMBULATORY_CARE_PROVIDER_SITE_OTHER): Payer: 59 | Admitting: Cardiology

## 2021-03-11 ENCOUNTER — Encounter: Payer: Self-pay | Admitting: Cardiology

## 2021-03-11 ENCOUNTER — Other Ambulatory Visit: Payer: Self-pay

## 2021-03-11 VITALS — BP 160/80 | HR 98 | Ht 71.0 in | Wt >= 6400 oz

## 2021-03-11 DIAGNOSIS — G4730 Sleep apnea, unspecified: Secondary | ICD-10-CM

## 2021-03-11 DIAGNOSIS — I10 Essential (primary) hypertension: Secondary | ICD-10-CM | POA: Diagnosis not present

## 2021-03-11 DIAGNOSIS — N179 Acute kidney failure, unspecified: Secondary | ICD-10-CM

## 2021-03-11 DIAGNOSIS — I5033 Acute on chronic diastolic (congestive) heart failure: Secondary | ICD-10-CM

## 2021-03-11 MED ORDER — CARVEDILOL 25 MG PO TABS: 25.0000 mg | ORAL_TABLET | Freq: Two times a day (BID) | ORAL | 6 refills | Status: DC

## 2021-03-11 NOTE — Patient Instructions (Signed)
Medication Instructions:  Increase carvedilol 25mg  *If you need a refill on your cardiac medications before your next appointment, please call your pharmacy*  Lab Work: NONE  Testing/Procedures: NONE  Special Instructions REFER TO DR FOR SEVERE SLEEP APNEA  Follow-Up: Your next appointment:  6 month(s) In Person with You will see one of the following Advanced Practice Providers on your designated Care Team:  Tresa Endo, PA-C  Theodore Demark, DNP, ANP  At [facility], you and your health needs are our priority.  As part of our continuing mission to provide you with exceptional heart care, we have created designated Provider Care Teams.  These Care Teams include your primary Cardiologist (physician) and Advanced Practice Providers (APPs -  Physician Assistants and Nurse Practitioners) who all work together to provide you with the care you need, when you need it.  We recommend signing up for the patient portal called "MyChart".  Sign up information is provided on this After Visit Summary.  MyChart is used to connect with patients for Virtual Visits (Telemedicine).  Patients are able to view lab/test results, encounter notes, upcoming appointments, etc.  Non-urgent messages can be sent to your provider as well.   To learn more about what you can do with MyChart, go to [facility].      _

## 2021-03-12 ENCOUNTER — Ambulatory Visit: Payer: 59 | Admitting: Internal Medicine

## 2021-03-20 ENCOUNTER — Other Ambulatory Visit: Payer: Self-pay

## 2021-03-20 ENCOUNTER — Telehealth (INDEPENDENT_AMBULATORY_CARE_PROVIDER_SITE_OTHER): Payer: 59 | Admitting: Nurse Practitioner

## 2021-03-20 ENCOUNTER — Ambulatory Visit: Payer: 59 | Admitting: Internal Medicine

## 2021-03-20 DIAGNOSIS — R5381 Other malaise: Secondary | ICD-10-CM | POA: Diagnosis not present

## 2021-03-20 DIAGNOSIS — I10 Essential (primary) hypertension: Secondary | ICD-10-CM | POA: Diagnosis not present

## 2021-03-20 DIAGNOSIS — E118 Type 2 diabetes mellitus with unspecified complications: Secondary | ICD-10-CM

## 2021-03-20 MED ORDER — LOSARTAN POTASSIUM 25 MG PO TABS
ORAL_TABLET | ORAL | 0 refills | Status: DC
Start: 2021-03-20 — End: 2021-10-30

## 2021-03-20 NOTE — Progress Notes (Signed)
Virtual Visit via Telephone Note  I connected with Edwin Mcclain on 03/25/21 at  1:00 PM EDT by telephone and verified that I am speaking with the correct person using two identifiers.  Location: Patient: home Provider: office   I discussed the limitations, risks, security and privacy concerns of performing an evaluation and management service by telephone and the availability of in person appointments. I also discussed with the patient that there may be a patient responsible charge related to this service. The patient expressed understanding and agreed to proceed.  Patient with history of HTN,HL,CHF(echo 01/2019 60 to 65%), morbid obesity, DM,chronic respiratory failure with hypoxia   History of Present Illness:  Patient presents today for follow-up visit.  He is a patient of Dr. Earlene Plater.  Patient has been struggling with CHF since this past August.  He does wear oxygen when needed.  He states that he is very physically deconditioned and fatigued.  We discussed that may be beneficial to have physical therapy evaluate him.  Patient is due for blood work including hemoglobin A1c, CBC, CMP.  He does need a refill on his Cozaar. Denies f/c/s, n/v/d, hemoptysis, PND, chest pain or edema.     Observations/Objective:  Vitals with BMI 03/11/2021 09/05/2020 10/17/2019  Height 5\' 11"  - -  Weight 405 lbs 406 lbs 385 lbs 3 oz  BMI 56.51 - -  Systolic 160 161  Diastolic 80 93 95  Pulse 98 104 93      Assessment and Plan:  Physical Deconditioning:  Will order PT  Essential hypertension continue - losartan (COZAAR)   2. Type 2 diabetes mellitus without complication, without long-term current use of insulin (HCC) Please limit sugar/refined carbohydrates.  Work at a goal of eliminating sugary drinks, candy, desserts, sweets, refined sugars, processed foods, and [place] carbohydrates.    Will order follow up labs    Follow up with Dr. 993 in 1 month for diabetic  management     I discussed the assessment and treatment plan with the patient. The patient was provided an opportunity to ask questions and all were answered. The patient agreed with the plan and demonstrated an understanding of the instructions.   The patient was advised to call back or seek an in-person evaluation if the symptoms worsen or if the condition fails to improve as anticipated.  I provided 23 minutes of non-face-to-face time during this encounter.   Earlene Plater, NP

## 2021-03-25 NOTE — Patient Instructions (Addendum)
Physical Deconditioning:  Will order PT  Essential hypertension continue - losartan (COZAAR)   2. Type 2 diabetes mellitus without complication, without long-term current use of insulin (HCC) Please limit sugar/refined carbohydrates.  Work at a goal of eliminating sugary drinks, candy, desserts, sweets, refined sugars, processed foods, and [place] carbohydrates.    Will order follow up labs  Follow up with Dr. Earlene Plater in 1 month for diabetic management

## 2021-03-31 ENCOUNTER — Inpatient Hospital Stay (HOSPITAL_COMMUNITY)
Admission: EM | Admit: 2021-03-31 | Discharge: 2021-04-04 | DRG: 205 | Disposition: A | Discharge status: Home / Self Care | Payer: 59 | Attending: Internal Medicine | Admitting: Internal Medicine

## 2021-03-31 ENCOUNTER — Emergency Department (HOSPITAL_COMMUNITY): Payer: 59

## 2021-03-31 ENCOUNTER — Other Ambulatory Visit: Payer: Self-pay

## 2021-03-31 ENCOUNTER — Encounter (HOSPITAL_COMMUNITY): Payer: Self-pay

## 2021-03-31 DIAGNOSIS — I890 Lymphedema, not elsewhere classified: Secondary | ICD-10-CM | POA: Diagnosis present

## 2021-03-31 DIAGNOSIS — Z79899 Other long term (current) drug therapy: Secondary | ICD-10-CM

## 2021-03-31 DIAGNOSIS — I110 Hypertensive heart disease with heart failure: Secondary | ICD-10-CM | POA: Diagnosis present

## 2021-03-31 DIAGNOSIS — D649 Anemia, unspecified: Secondary | ICD-10-CM | POA: Diagnosis not present

## 2021-03-31 DIAGNOSIS — E118 Type 2 diabetes mellitus with unspecified complications: Secondary | ICD-10-CM | POA: Diagnosis not present

## 2021-03-31 DIAGNOSIS — D6489 Other specified anemias: Secondary | ICD-10-CM | POA: Diagnosis present

## 2021-03-31 DIAGNOSIS — I5032 Chronic diastolic (congestive) heart failure: Secondary | ICD-10-CM | POA: Diagnosis present

## 2021-03-31 DIAGNOSIS — I872 Venous insufficiency (chronic) (peripheral): Secondary | ICD-10-CM | POA: Diagnosis present

## 2021-03-31 DIAGNOSIS — I10 Essential (primary) hypertension: Secondary | ICD-10-CM | POA: Diagnosis present

## 2021-03-31 DIAGNOSIS — Z7984 Long term (current) use of oral hypoglycemic drugs: Secondary | ICD-10-CM | POA: Diagnosis not present

## 2021-03-31 DIAGNOSIS — I878 Other specified disorders of veins: Secondary | ICD-10-CM | POA: Diagnosis present

## 2021-03-31 DIAGNOSIS — L83 Acanthosis nigricans: Secondary | ICD-10-CM | POA: Diagnosis present

## 2021-03-31 DIAGNOSIS — G4733 Obstructive sleep apnea (adult) (pediatric): Secondary | ICD-10-CM | POA: Diagnosis not present

## 2021-03-31 DIAGNOSIS — J441 Chronic obstructive pulmonary disease with (acute) exacerbation: Secondary | ICD-10-CM | POA: Diagnosis present

## 2021-03-31 DIAGNOSIS — E662 Morbid (severe) obesity with alveolar hypoventilation: Principal | ICD-10-CM | POA: Diagnosis present

## 2021-03-31 DIAGNOSIS — F17200 Nicotine dependence, unspecified, uncomplicated: Secondary | ICD-10-CM | POA: Diagnosis present

## 2021-03-31 DIAGNOSIS — J9622 Acute and chronic respiratory failure with hypercapnia: Secondary | ICD-10-CM | POA: Diagnosis present

## 2021-03-31 DIAGNOSIS — Z791 Long term (current) use of non-steroidal anti-inflammatories (NSAID): Secondary | ICD-10-CM | POA: Diagnosis not present

## 2021-03-31 DIAGNOSIS — M7989 Other specified soft tissue disorders: Secondary | ICD-10-CM | POA: Diagnosis not present

## 2021-03-31 DIAGNOSIS — Z9981 Dependence on supplemental oxygen: Secondary | ICD-10-CM

## 2021-03-31 DIAGNOSIS — E1169 Type 2 diabetes mellitus with other specified complication: Secondary | ICD-10-CM | POA: Diagnosis present

## 2021-03-31 DIAGNOSIS — Z6841 Body Mass Index (BMI) 40.0 and over, adult: Secondary | ICD-10-CM

## 2021-03-31 DIAGNOSIS — E785 Hyperlipidemia, unspecified: Secondary | ICD-10-CM | POA: Diagnosis present

## 2021-03-31 DIAGNOSIS — Z7982 Long term (current) use of aspirin: Secondary | ICD-10-CM

## 2021-03-31 DIAGNOSIS — R609 Edema, unspecified: Secondary | ICD-10-CM | POA: Diagnosis not present

## 2021-03-31 DIAGNOSIS — E1165 Type 2 diabetes mellitus with hyperglycemia: Secondary | ICD-10-CM | POA: Diagnosis present

## 2021-03-31 DIAGNOSIS — Z20822 Contact with and (suspected) exposure to covid-19: Secondary | ICD-10-CM | POA: Diagnosis present

## 2021-03-31 DIAGNOSIS — J9621 Acute and chronic respiratory failure with hypoxia: Secondary | ICD-10-CM | POA: Diagnosis present

## 2021-03-31 DIAGNOSIS — R0602 Shortness of breath: Secondary | ICD-10-CM | POA: Diagnosis present

## 2021-03-31 DIAGNOSIS — R0603 Acute respiratory distress: Secondary | ICD-10-CM | POA: Diagnosis not present

## 2021-03-31 HISTORY — DX: Chronic obstructive pulmonary disease, unspecified: J44.9

## 2021-03-31 HISTORY — DX: Heart failure, unspecified: I50.9

## 2021-03-31 LAB — COMPREHENSIVE METABOLIC PANEL
ALT: 32 U/L (ref 0–44)
AST: 29 U/L (ref 15–41)
Albumin: 2.9 g/dL — ABNORMAL LOW (ref 3.5–5.0)
Alkaline Phosphatase: 89 U/L (ref 38–126)
Anion gap: 5 (ref 5–15)
BUN: 11 mg/dL (ref 6–20)
CO2: 41 mmol/L — ABNORMAL HIGH (ref 22–32)
Calcium: 8.9 mg/dL (ref 8.9–10.3)
Chloride: 94 mmol/L — ABNORMAL LOW (ref 98–111)
Creatinine, Ser: 0.97 mg/dL (ref 0.61–1.24)
GFR, Estimated: >60 mL/min (ref >60)
Glucose, Bld: 154 mg/dL — ABNORMAL HIGH (ref 70–99)
Potassium: 4.1 mmol/L (ref 3.5–5.1)
Sodium: 140 mmol/L (ref 135–145)
Total Bilirubin: 0.8 mg/dL (ref 0.3–1.2)
Total Protein: 7.3 g/dL (ref 6.5–8.1)

## 2021-03-31 LAB — CBC WITH DIFFERENTIAL/PLATELET
Abs Immature Granulocytes: 0.02 10*3/uL (ref 0.00–0.07)
Basophils Absolute: 0 10*3/uL (ref 0.0–0.1)
Basophils Relative: 0 %
Eosinophils Absolute: 0.1 10*3/uL (ref 0.0–0.5)
Eosinophils Relative: 1 %
HCT: 35.3 % — ABNORMAL LOW (ref 39.0–52.0)
Hemoglobin: 10.4 g/dL — ABNORMAL LOW (ref 13.0–17.0)
Immature Granulocytes: 0 %
Lymphocytes Relative: 22 %
Lymphs Abs: 1.5 10*3/uL (ref 0.7–4.0)
MCH: 23.6 pg — ABNORMAL LOW (ref 26.0–34.0)
MCHC: 29.5 g/dL — ABNORMAL LOW (ref 30.0–36.0)
MCV: 80.2 fL (ref 80.0–100.0)
Monocytes Absolute: 0.6 10*3/uL (ref 0.1–1.0)
Monocytes Relative: 9 %
Neutro Abs: 4.4 10*3/uL (ref 1.7–7.7)
Neutrophils Relative %: 68 %
Platelets: 214 10*3/uL (ref 150–400)
RBC: 4.4 MIL/uL (ref 4.22–5.81)
RDW: 18.7 % — ABNORMAL HIGH (ref 11.5–15.5)
WBC: 6.6 10*3/uL (ref 4.0–10.5)
nRBC: 0 % (ref 0.0–0.2)

## 2021-03-31 LAB — RETICULOCYTES
Immature Retic Fract: 24.8 % — ABNORMAL HIGH (ref 2.3–15.9)
RBC.: 4.37 MIL/uL (ref 4.22–5.81)
Retic Count, Absolute: 81.7 10*3/uL (ref 19.0–186.0)
Retic Ct Pct: 1.9 % (ref 0.4–3.1)

## 2021-03-31 LAB — IRON AND TIBC
Iron: 49 ug/dL (ref 45–182)
Saturation Ratios: 15 % — ABNORMAL LOW (ref 17.9–39.5)
TIBC: 316 ug/dL (ref 250–450)
UIBC: 267 ug/dL

## 2021-03-31 LAB — FERRITIN: Ferritin: 86 ng/mL (ref 24–336)

## 2021-03-31 LAB — BRAIN NATRIURETIC PEPTIDE: B Natriuretic Peptide: 40.1 pg/mL (ref 0.0–100.0)

## 2021-03-31 LAB — SARS CORONAVIRUS 2 (TAT 6-24 HRS): SARS Coronavirus 2: NEGATIVE

## 2021-03-31 LAB — FOLATE: Folate: 5.5 ng/mL — ABNORMAL LOW (ref >5.9)

## 2021-03-31 LAB — VITAMIN B12: Vitamin B-12: 244 pg/mL (ref 180–914)

## 2021-03-31 LAB — TROPONIN I (HIGH SENSITIVITY)
Troponin I (High Sensitivity): 5 ng/L (ref <18)
Troponin I (High Sensitivity): 4 ng/L (ref <18)

## 2021-03-31 LAB — LIPASE, BLOOD: Lipase: 26 U/L (ref 11–51)

## 2021-03-31 MED ORDER — ALBUTEROL SULFATE HFA 108 (90 BASE) MCG/ACT IN AERS
4.0000 | INHALATION_SPRAY | Freq: Once | RESPIRATORY_TRACT | Status: AC
  Administered 2021-03-31: 4 via RESPIRATORY_TRACT
  Filled 2021-03-31: qty 6.7

## 2021-03-31 MED ORDER — METHYLPREDNISOLONE SODIUM SUCC 40 MG IJ SOLR: 40.0000 mg | Freq: Four times a day (QID) | INTRAMUSCULAR | Status: DC

## 2021-03-31 MED ORDER — ACETAMINOPHEN 650 MG RE SUPP: 650.0000 mg | Freq: Four times a day (QID) | RECTAL | Status: DC | PRN

## 2021-03-31 MED ORDER — ALBUTEROL SULFATE HFA 108 (90 BASE) MCG/ACT IN AERS: 1.0000 | INHALATION_SPRAY | RESPIRATORY_TRACT | Status: DC | PRN

## 2021-03-31 MED ORDER — METHYLPREDNISOLONE SODIUM SUCC 40 MG IJ SOLR
40.0000 mg | Freq: Four times a day (QID) | INTRAMUSCULAR | Status: DC
  Administered 2021-04-01 (×3): 40 mg via INTRAVENOUS
  Filled 2021-03-31 (×3): qty 1

## 2021-03-31 MED ORDER — SODIUM CHLORIDE 0.9 % IV SOLN
500.0000 mg | Freq: Once | INTRAVENOUS | Status: AC
  Administered 2021-03-31: 500 mg via INTRAVENOUS
  Filled 2021-03-31: qty 500

## 2021-03-31 MED ORDER — IPRATROPIUM BROMIDE HFA 17 MCG/ACT IN AERS
2.0000 | INHALATION_SPRAY | Freq: Once | RESPIRATORY_TRACT | Status: AC
  Administered 2021-03-31: 2 via RESPIRATORY_TRACT
  Filled 2021-03-31: qty 12.9

## 2021-03-31 MED ORDER — METHYLPREDNISOLONE SODIUM SUCC 125 MG IJ SOLR
125.0000 mg | Freq: Once | INTRAMUSCULAR | Status: AC
  Administered 2021-03-31: 125 mg via INTRAVENOUS
  Filled 2021-03-31: qty 2

## 2021-03-31 MED ORDER — FUROSEMIDE 10 MG/ML IJ SOLN
40.0000 mg | Freq: Once | INTRAMUSCULAR | Status: AC
  Administered 2021-03-31: 40 mg via INTRAVENOUS
  Filled 2021-03-31: qty 4

## 2021-03-31 MED ORDER — SODIUM CHLORIDE 0.9 % IV SOLN
1.0000 g | Freq: Once | INTRAVENOUS | Status: AC
  Administered 2021-03-31: 1 g via INTRAVENOUS
  Filled 2021-03-31: qty 10

## 2021-03-31 MED ORDER — IPRATROPIUM-ALBUTEROL 20-100 MCG/ACT IN AERS
1.0000 | INHALATION_SPRAY | Freq: Four times a day (QID) | RESPIRATORY_TRACT | Status: DC
  Filled 2021-03-31: qty 4

## 2021-03-31 MED ORDER — ENOXAPARIN SODIUM 40 MG/0.4ML ~~LOC~~ SOLN
40.0000 mg | SUBCUTANEOUS | Status: DC
  Administered 2021-03-31: 40 mg via SUBCUTANEOUS
  Filled 2021-03-31: qty 0.4

## 2021-03-31 MED ORDER — ACETAMINOPHEN 325 MG PO TABS
650.0000 mg | ORAL_TABLET | Freq: Four times a day (QID) | ORAL | Status: DC | PRN
  Administered 2021-04-02: 650 mg via ORAL
  Filled 2021-03-31: qty 2

## 2021-03-31 NOTE — ED Triage Notes (Signed)
Pat to ED via EMS for complaints of increasing SOB. States it has been present x2 days. He has Hx of CHF and COPD and is chronically on 3L [place]. Per EMS patient was 71% on room air.

## 2021-03-31 NOTE — ED Provider Notes (Signed)
[facility] EMERGENCY DEPARTMENT Provider Note   CSN: [id number] Arrival date & time: 03/31/21  1705     History Chief Complaint  Patient presents with  . Shortness of Breath    Edwin Mcclain is a 55 y.o. male.  HPI Patient is a 55 year old male with a history of heart failure, COPD, hypertension, obesity presenting with a chief complaint of increasing shortness of breath over the last 3 days.  Patient was recently seen in cardiology and is on 3 L of nasal cannula at baseline.  They noted that he had been gaining weight due to some personal stressors.  He has continued to gain weight and over the last 3 days has felt worsening of his respiratory status.  EMS was called out and patient was 71% on room air on their arrival.  Patient is supposed to be on 3 L but was not using it today.  Patient placed on 5 L with improvement of his respiratory status.  Patient denies any productive cough, fevers or chills, nausea or vomiting, abdominal pain or chest pain at this time. Patient endorses he has been compliant with his home medications including diuretics.  Past Medical History:  Diagnosis Date  . Acute diastolic HF (heart failure) (HCC) 02/08/2019  . Acute respiratory failure with hypoxia (HCC) 02/08/2019  . CHF (congestive heart failure) (HCC)   . COPD (chronic obstructive pulmonary disease) (HCC)   . HTN (hypertension)   . Morbid obesity [facility])     Patient Active Problem List   Diagnosis Date Noted  . Acute exacerbation of chronic obstructive pulmonary disease (COPD) (HCC) 03/31/2021  . OSA (obstructive sleep apnea) 09/12/2019  . Overgrown toenails 09/12/2019  . Acute on chronic diastolic HF (heart failure) (HCC) 08/24/2019  . Snoring 08/24/2019  . AKI (acute kidney injury) (HCC) 08/24/2019  . Type 2 diabetes mellitus with complication, without long-term current use of insulin (HCC) 08/24/2019  . Acute on chronic respiratory failure with hypoxia and hypercapnia  (HCC) 07/14/2019  . Acute CHF (congestive heart failure) (HCC) 02/08/2019  . Acute respiratory failure with hypoxia (HCC) 02/08/2019  . HTN (hypertension)   . Morbid obesity (HCC)     Past Surgical History:  Procedure Laterality Date  . C-spine surgery         Family History  Problem Relation Age of Onset  . Cancer Mother        Patient is not sure which type of cancer  . Leukemia Father     Social History   Tobacco Use  . Smoking status: Never Smoker  . Smokeless tobacco: Never Used  Vaping Use  . Vaping Use: Never used  Substance Use Topics  . Alcohol use: Never  . Drug use: Never    Home Medications Prior to Admission medications   Medication Sig Start Date End Date Taking? Authorizing Provider  aspirin 81 MG EC tablet Take 1 tablet (81 mg total) by mouth daily. 12/12/19   Anders Simmonds, PA-C  atorvastatin (LIPITOR) 10 MG tablet TAKE 1 TABLET(10 MG) BY MOUTH DAILY 02/05/20   Marcine Matar, MD  carvedilol (COREG) 25 MG tablet Take 1 tablet (25 mg total) by mouth 2 (two) times daily with a meal. 03/11/21   Rollene Rotunda, MD  iron polysaccharides (NU-IRON) 150 MG capsule Take 1 capsule by mouth daily. 07/18/19   [provider]  Lancet Devices (ONE TOUCH DELICA LANCING DEV) MISC Use as directed. Dx: E10.9, E11.9 10/17/19   Marcine Matar, MD  losartan (COZAAR) 25 MG tablet TAKE 1 TABLET(25 MG) BY MOUTH DAILY 03/20/21   Ivonne AndrewNichols, Tonya S, NP  Melatonin 10 MG TABS Take 1 tablet by mouth at bedtime as needed.    [provider]  meloxicam (MOBIC) 7.5 MG tablet Take 1 tablet (7.5 mg total) by mouth daily. 04/29/20   Arvilla MarketWallace, Catherine Lauren, DO  OneTouch Delica Lancets 30G MISC 1 each by Does not apply route daily. Dx: E10.9, E11.9 10/17/19   Marcine MatarJohnson, Deborah B, MD  sitaGLIPtin (JANUVIA) 25 MG tablet Take 1 tablet (25 mg total) by mouth daily. 09/05/20   Arvilla MarketWallace, Catherine Lauren, DO  torsemide (DEMADEX) 20 MG tablet TAKE 1 TABLET BY MOUTH TWICE  DAILY 02/12/20   Marcine MatarJohnson, Deborah B, MD    Allergies    Patient has no known allergies.  Review of Systems   Review of Systems  Constitutional: Negative for chills and fever.  HENT: Negative for ear pain and sore throat.   Eyes: Negative for pain and visual disturbance.  Respiratory: Positive for shortness of breath. Negative for cough.   Cardiovascular: Positive for leg swelling. Negative for chest pain and palpitations.  Gastrointestinal: Negative for abdominal pain and vomiting.  Genitourinary: Negative for dysuria and hematuria.  Musculoskeletal: Negative for arthralgias and back pain.  Skin: Negative for color change and rash.  Neurological: Negative for seizures and syncope.  All other systems reviewed and are negative.   Physical Exam Updated Vital Signs BP 133/74   Pulse 84   Temp 98.3 F (36.8 C) (Temporal)   Resp (!) 34   Ht 5\' 11"  (1.803 m)   Wt (!) 183.7 kg   SpO2 90%   BMI 56.49 kg/m   Physical Exam Vitals and nursing note reviewed.  Constitutional:      Appearance: He is well-developed.  HENT:     Head: Normocephalic and atraumatic.     Nose: No congestion or rhinorrhea.     Mouth/Throat:     Mouth: Mucous membranes are moist.     Pharynx: Oropharynx is clear. No oropharyngeal exudate.  Eyes:     Conjunctiva/sclera: Conjunctivae normal.     Pupils: Pupils are equal, round, and reactive to light.  Cardiovascular:     Rate and Rhythm: Normal rate and regular rhythm.     Heart sounds: No murmur heard.   Pulmonary:     Effort: Pulmonary effort is normal. Tachypnea present. No respiratory distress.     Breath sounds: Rales present.  Abdominal:     Palpations: Abdomen is soft.     Tenderness: There is no abdominal tenderness.  Musculoskeletal:        General: Swelling present. No tenderness, deformity or signs of injury. Normal range of motion.     Cervical back: Neck supple. No rigidity or tenderness.     Right lower leg: Edema present.     Left  lower leg: Edema present.  Skin:    General: Skin is warm and dry.  Neurological:     General: No focal deficit present.     Mental Status: He is alert and oriented to person, place, and time. Mental status is at baseline.     Cranial Nerves: No cranial nerve deficit.     Motor: No weakness.     ED Results / Procedures / Treatments   Labs (all labs ordered are listed, but only abnormal results are displayed) Labs Reviewed  CBC WITH DIFFERENTIAL/PLATELET - Abnormal; Notable for the following components:  Result Value   Hemoglobin 10.4 (*)    HCT 35.3 (*)    MCH 23.6 (*)    MCHC 29.5 (*)    RDW 18.7 (*)    All other components within normal limits  COMPREHENSIVE METABOLIC PANEL - Abnormal; Notable for the following components:   Chloride 94 (*)    CO2 41 (*)    Glucose, Bld 154 (*)    Albumin 2.9 (*)    All other components within normal limits  RETICULOCYTES - Abnormal; Notable for the following components:   Immature Retic Fract 24.8 (*)    All other components within normal limits  SARS CORONAVIRUS 2 (TAT 6-24 HRS)  LIPASE, BLOOD  BRAIN NATRIURETIC PEPTIDE  VITAMIN B12  FOLATE  IRON AND TIBC  FERRITIN  HIV ANTIBODY (ROUTINE TESTING W REFLEX)  TROPONIN I (HIGH SENSITIVITY)  TROPONIN I (HIGH SENSITIVITY)    EKG EKG Interpretation  Date/Time:  Monday March 31 2021 17:12:57 EDT Ventricular Rate:  86 PR Interval:  138 QRS Duration: 88 QT Interval:  355 QTC Calculation: 425 R Axis:   81 Text Interpretation: Sinus rhythm Confirmed by Cathren Laine ([id number]) on 03/31/2021 5:15:27 PM   Radiology DG Chest Portable 1 View  Result Date: 03/31/2021 CLINICAL DATA:  Chest pain, short of breath EXAM: PORTABLE CHEST 1 VIEW COMPARISON:  02/07/2019 FINDINGS: Single frontal view of the chest demonstrates chronic enlargement the cardiac silhouette. There is central vascular congestion without airspace disease, effusion, or pneumothorax. No acute bony abnormalities.  IMPRESSION: 1. Chronic enlargement the cardiac silhouette. 2. Pulmonary vascular congestion without overt edema. Electronically Signed   By: Sharlet Salina M.D.   On: 03/31/2021 17:59    Procedures Procedures   Medications Ordered in ED Medications  acetaminophen (TYLENOL) tablet 650 mg (has no administration in time range)    Or  acetaminophen (TYLENOL) suppository 650 mg (has no administration in time range)  enoxaparin (LOVENOX) injection 40 mg (has no administration in time range)  Ipratropium-Albuterol (COMBIVENT) respimat 1 puff (has no administration in time range)  albuterol (VENTOLIN HFA) 108 (90 Base) MCG/ACT inhaler 1-2 puff (has no administration in time range)  methylPREDNISolone sodium succinate (SOLU-MEDROL) 40 mg/mL injection 40 mg (has no administration in time range)  methylPREDNISolone sodium succinate (SOLU-MEDROL) 125 mg/2 mL injection 125 mg (125 mg Intravenous Given 03/31/21 2016)  azithromycin (ZITHROMAX) 500 mg in sodium chloride 0.9 % 250 mL IVPB (500 mg Intravenous New Bag/Given 03/31/21 2058)  cefTRIAXone (ROCEPHIN) 1 g in sodium chloride 0.9 % 100 mL IVPB (0 g Intravenous Stopped 03/31/21 2054)  furosemide (LASIX) injection 40 mg (40 mg Intravenous Given 03/31/21 2016)  albuterol (VENTOLIN HFA) 108 (90 Base) MCG/ACT inhaler 4 puff (4 puffs Inhalation Given 03/31/21 2016)  ipratropium (ATROVENT HFA) inhaler 2 puff (2 puffs Inhalation Given 03/31/21 2016)    ED Course  I have reviewed the triage vital signs and the nursing notes.  Pertinent labs & imaging results that were available during my care of the patient were reviewed by me and considered in my medical decision making (see chart for details).    MDM Rules/Calculators/A&P                           Medical Decision Making:  Sundeep Destin is a 55 y.o. male with a comorbid medical history who presented to the ED today with progressive shortness of breath over the past 3 days.  Patient on 5 L nasal cannula at  this time when baseline is 3 L.   On my initial exam, the pt was tachypneic with some end expiratory wheezes but also rales appreciated.   Reviewed and confirmed nursing documentation for past medical history, family history, social history.   Initial Assessment:  With the patient's presentation of SOB, most likely diagnosis is COPD exacerbation. Other diagnoses were considered including (but not limited to) volume overload with heart failure exacerbation, new pulmonary embolism, pneumonia, coronavirus, ACS. These are considered less likely due to history of present illness and physical exam findings.   Initial Plan: 1. Evaluation for ACS with EKG and delta troponin which was grossly unremarkable at this time. 2. Evaluation for infectious versus intrathoracic abnormality with chest x-ray which demonstrated no acute intra-thoracic abnormality at this time 3. Evaluation for volume overload with BNP and chest x-ray without elevations 4. Evaluation for underlying metabolic or infectious etiology of patient's symptoms with CBC and BMP were grossly unremarkable at this time. Radiology DG Chest Portable 1 View  Final Result      Final Assessment and Plan:  Patient history of present illness physical exam findings are most consistent with COPD exacerbation of this time.  Patient treated with Solu-Medrol, azithromycin, ceftriaxone and admitted given his persistent need for increased oxygen requirement at this time. Disposition: Based on the above findings, I believe patient is stable for admission.   Patient/family educated about specific findings on our evaluation and explained exact reasons for admission.  Patient/family educated about clinical situation and time was allowed to answer questions.   Admission team communicated with and agreed with need for admission. Patient admitted. Patient ready to move at this time.    Emergency Department Medication Summary: Medications  acetaminophen  (TYLENOL) tablet 650 mg (has no administration in time range)    Or  acetaminophen (TYLENOL) suppository 650 mg (has no administration in time range)  enoxaparin (LOVENOX) injection 40 mg (has no administration in time range)  Ipratropium-Albuterol (COMBIVENT) respimat 1 puff (has no administration in time range)  albuterol (VENTOLIN HFA) 108 (90 Base) MCG/ACT inhaler 1-2 puff (has no administration in time range)  methylPREDNISolone sodium succinate (SOLU-MEDROL) 40 mg/mL injection 40 mg (has no administration in time range)  methylPREDNISolone sodium succinate (SOLU-MEDROL) 125 mg/2 mL injection 125 mg (125 mg Intravenous Given 03/31/21 2016)  azithromycin (ZITHROMAX) 500 mg in sodium chloride 0.9 % 250 mL IVPB (500 mg Intravenous New Bag/Given 03/31/21 2058)  cefTRIAXone (ROCEPHIN) 1 g in sodium chloride 0.9 % 100 mL IVPB (0 g Intravenous Stopped 03/31/21 2054)  furosemide (LASIX) injection 40 mg (40 mg Intravenous Given 03/31/21 2016)  albuterol (VENTOLIN HFA) 108 (90 Base) MCG/ACT inhaler 4 puff (4 puffs Inhalation Given 03/31/21 2016)  ipratropium (ATROVENT HFA) inhaler 2 puff (2 puffs Inhalation Given 03/31/21 2016)     Final Clinical Impression(s) / ED Diagnoses Final diagnoses:  None    Rx / DC Orders ED Discharge Orders    None       Glyn Ade, MD 03/31/21 2231    Cathren Laine, MD 04/02/21 [phone]

## 2021-03-31 NOTE — ED Notes (Signed)
Patient given cup of ice at this time.

## 2021-03-31 NOTE — Progress Notes (Signed)
Respiratory assessment per MD orders.   Spoken to patient in regards of medical history and current hospital c/c. Patient states he was SOB once came into the hospital.  Patient denies, fever, chills, palpitations, cough and/or congestion.  Patient states that he has hx: CHF, COPD. Patient is chronically  O2 dependant wears 3L at home. BBS to ausculation reveals no wheezing at this time, airflow limitation throughout. No increase WOB or accessory muscle usage noted at this time. Patient is on 4L of O2 currently with acceptable saturations. Respirations low 20's. HR 82bpm.  CXR: reveals pulmonary vascular congestion 03/31/2021 1736. Patient states takes no home medication breathing treatments.   Trexton Escamilla L. Katrinka Blazing, BS, RRT, RCP

## 2021-03-31 NOTE — ED Notes (Signed)
Patient wheeled to bathroom at this time for BM with [place] o2 at [facility]

## 2021-03-31 NOTE — H&P (Signed)
History and Physical    PLEASE NOTE THAT DRAGON DICTATION SOFTWARE WAS USED IN THE CONSTRUCTION OF THIS NOTE.   Edwin Mcclain ZOX:[id number] DOB: 1966/12/09 DOA: 03/31/2021  PCP: Arvilla Market, DO Patient coming from: home   I have personally briefly reviewed patient's old medical records in [facility]  Chief Complaint: Shortness of breath  HPI: Edwin Mcclain is a 55 y.o. male with medical history significant for chronic hypoxic respiratory failure on continuous 3 L/min, COPD, chronic diastolic heart failure, hypertension, type 2 diabetes mellitus, who is admitted to [facility] on 03/31/2021 with acute on chronic hypoxic respiratory failure after presenting from home to [facility] ED complaining of shortness of breath.   The patient reports 3 days of shortness of breath associated mild wheezing.  He reports chronic edema in the bilateral lower extremities, without any recent worsening thereof.  He also denies any associated orthopnea or PND.  Denies any associated chest pain, palpitations, diaphoresis, nausea, vomiting, dizziness, presyncope, or syncope.  Denies any associated subjective fever, chills, rigors, or generalized myalgias.  Also denies any recent cough, hemoptysis, calf tenderness, or new lower extremity erythema.  Not associate with any recent melena or hematochezia.  Denies any recent trauma or periods of prolonged diminished ambulatory status, or travel.  Denies any known recent COVID-19 exposures.  Patient confirms compliance with his home chronic 3 L continuous supplemental oxygen.  Denies any significant recent increase in weight.  The setting of progression of his shortness of breath, the patient contacted EMS, reportedly found the patient's initial oxygen saturations to be in the low 70s on his baseline supplemental 3 L continuous O2.  He was subsequently brought to [facility] ED for further evaluation of acute on chronic hypoxic respiratory failure.   ED  Course:  Vital signs in the ED were notable for the following: Tetramex 98.3, heart rate 7076; blood pressure 112/67 -141/82; respiratory rate 17-24; initial oxygen saturation upon arrival in the emergency department noted to be 94% on 5 L nasal cannula, subsequently is O2 sats ranged between 94 to 97% on 5 L nasal cannula.  Labs were notable for the following: CMP notable for the following: Sodium 140, potassium 4.1, bicarbonate 41, creatinine 0.97.  BNP 40.  High-sensitivity troponin I x2 were found to be 5 followed by value of 4.  CBC notable for Ahlquist blood cell count of 6600, hemoglobin 10.4 relative to baseline range of 12-13, with presenting hemoglobin noted to be associated with MCV 80, hypochromic findings as well as the elevated RDW of 18.7.  Nasopharyngeal COVID-19 teen/influenza PCR performed in the ED today found to be negative.  EKG showed sinus rhythm with heart rate 86, and no evidence of T wave or ST changes, including no evidence of ST elevation.  Chest x-ray showed central vascular congestion without evidence of overt edema, infiltrate, effusion, or pneumothorax.  While in the ED, the following were administered: Cymetra 125 mg IV x1, duo nebulizer treatment x1, azithromycin, Rocephin, Lasix 40 mg IV x1, subselectively the patient was admitted to the med telemetry floor for further evaluation management of presenting acute on chronic hypoxic respiratory failure in the setting of suspected acute COPD exacerbation.     Review of Systems: As per HPI otherwise 10 point review of systems negative.   Past Medical History:  Diagnosis Date  . Acute diastolic HF (heart failure) (HCC) 02/08/2019  . Acute respiratory failure with hypoxia (HCC) 02/08/2019  . CHF (congestive heart failure) (HCC)   .  COPD (chronic obstructive pulmonary disease) (HCC)   . HTN (hypertension)   . Morbid obesity (HCC)     Past Surgical History:  Procedure Laterality Date  . C-spine surgery      Social  History:  reports that he has never smoked. He has never used smokeless tobacco. He reports that he does not drink alcohol and does not use drugs.   No Known Allergies  Family History  Problem Relation Age of Onset  . Cancer Mother        Patient is not sure which type of cancer  . Leukemia Father       Prior to Admission medications   Medication Sig Start Date End Date Taking? Authorizing Provider  aspirin 81 MG EC tablet Take 1 tablet (81 mg total) by mouth daily. 12/12/19   Anders Simmonds, PA-C  atorvastatin (LIPITOR) 10 MG tablet TAKE 1 TABLET(10 MG) BY MOUTH DAILY 02/05/20   Marcine Matar, MD  carvedilol (COREG) 25 MG tablet Take 1 tablet (25 mg total) by mouth 2 (two) times daily with a meal. 03/11/21   Rollene Rotunda, MD  iron polysaccharides (NU-IRON) 150 MG capsule Take 1 capsule by mouth daily. 07/18/19   [provider]  Lancet Devices (ONE TOUCH DELICA LANCING DEV) MISC Use as directed. Dx: E10.9, E11.9 10/17/19   Marcine Matar, MD  losartan (COZAAR) 25 MG tablet TAKE 1 TABLET(25 MG) BY MOUTH DAILY 03/20/21   Ivonne Andrew, NP  Melatonin 10 MG TABS Take 1 tablet by mouth at bedtime as needed.    [provider]  meloxicam (MOBIC) 7.5 MG tablet Take 1 tablet (7.5 mg total) by mouth daily. 04/29/20   Arvilla Market, DO  OneTouch Delica Lancets 30G MISC 1 each by Does not apply route daily. Dx: E10.9, E11.9 10/17/19   Marcine Matar, MD  sitaGLIPtin (JANUVIA) 25 MG tablet Take 1 tablet (25 mg total) by mouth daily. 09/05/20   Arvilla Market, DO  torsemide (DEMADEX) 20 MG tablet TAKE 1 TABLET BY MOUTH TWICE DAILY 02/12/20   Marcine Matar, MD     Objective    Physical Exam: Vitals:   03/31/21 1900 03/31/21 1930 03/31/21 2000 03/31/21 2030  BP: (!) 113/58 125/70 (!) 141/82 133/74  Pulse: 77 73 80 84  Resp: (!) 24 (!) 22 (!) 24 (!) 34  Temp:      TempSrc:      SpO2: 97% 95% 92% 90%  Weight:      Height:         General: appears to be stated age; alert, oriented; slightly increased work of breathing Skin: warm, dry, no rash Head:  AT/[place] Mouth:  Oral mucosa membranes appear moist, normal dentition Neck: supple; trachea midline Heart:  RRR; did not appreciate any M/R/G Lungs: CTAB, did not appreciate any wheezes, rales, or rhonchi Abdomen: + BS; soft, ND, NT Vascular: 2+ pedal pulses b/l; 2+ radial pulses b/l Extremities: 1-2+ edema in b/l LE"s; no muscle wasting Neuro: strength and sensation intact in upper and lower extremities b/l     Labs on Admission: I have personally reviewed following labs and imaging studies  CBC: Recent Labs  Lab 03/31/21 1722  WBC 6.6  NEUTROABS 4.4  HGB 10.4*  HCT 35.3*  MCV 80.2  PLT 214   Basic Metabolic Panel: Recent Labs  Lab 03/31/21 1722  NA 140  K 4.1  CL 94*  CO2 41*  GLUCOSE 154*  BUN 11  CREATININE 0.97  CALCIUM 8.9   GFR: Estimated Creatinine Clearance: 147.9 mL/min (by C-G formula based on SCr of 0.97 mg/dL). Liver Function Tests: Recent Labs  Lab 03/31/21 1722  AST 29  ALT 32  ALKPHOS 89  BILITOT 0.8  PROT 7.3  ALBUMIN 2.9*   Recent Labs  Lab 03/31/21 1722  LIPASE 26   No results for input(s): AMMONIA in the last 168 hours. Coagulation Profile: No results for input(s): INR, PROTIME in the last 168 hours. Cardiac Enzymes: No results for input(s): CKTOTAL, CKMB, CKMBINDEX, TROPONINI in the last 168 hours. BNP (last 3 results) No results for input(s): PROBNP in the last 8760 hours. HbA1C: No results for input(s): HGBA1C in the last 72 hours. CBG: No results for input(s): GLUCAP in the last 168 hours. Lipid Profile: No results for input(s): CHOL, HDL, LDLCALC, TRIG, CHOLHDL, LDLDIRECT in the last 72 hours. Thyroid Function Tests: No results for input(s): TSH, T4TOTAL, FREET4, T3FREE, THYROIDAB in the last 72 hours. Anemia Panel: No results for input(s): VITAMINB12, FOLATE, FERRITIN, TIBC, IRON, RETICCTPCT in  the last 72 hours. Urine analysis: No results found for: COLORURINE, APPEARANCEUR, LABSPEC, PHURINE, GLUCOSEU, HGBUR, BILIRUBINUR, KETONESUR, PROTEINUR, UROBILINOGEN, NITRITE, LEUKOCYTESUR  Radiological Exams on Admission: DG Chest Portable 1 View  Result Date: 03/31/2021 CLINICAL DATA:  Chest pain, short of breath EXAM: PORTABLE CHEST 1 VIEW COMPARISON:  02/07/2019 FINDINGS: Single frontal view of the chest demonstrates chronic enlargement the cardiac silhouette. There is central vascular congestion without airspace disease, effusion, or pneumothorax. No acute bony abnormalities. IMPRESSION: 1. Chronic enlargement the cardiac silhouette. 2. Pulmonary vascular congestion without overt edema. Electronically Signed   By: Sharlet SalinaMichael  Brown M.D.   On: 03/31/2021 17:59     EKG: Independently reviewed, with result as described above.    Assessment/Plan   Edwin Mcclain is a 55 y.o. male with medical history significant for chronic hypoxic respiratory failure on continuous 3 L/min, COPD, chronic diastolic heart failure, hypertension, type 2 diabetes mellitus, who is [facility]/2021 with acute on chronic hypoxic respiratory failure after [facility] from [facility] ED complaining of shortness of breath.    Principal Problem:   Acute exacerbation of chronic obstructive pulmonary disease (COPD) (HCC) Active Problems:   HTN (hypertension)   Type 2 diabetes mellitus with complication, without long-term current use of insulin (HCC)   Acute on chronic respiratory failure with hypoxia (HCC)   SOB (shortness of breath)   Anemia   Chronic diastolic CHF (congestive heart failure) (HCC)    #) Acute on chronic hypoxic respiratory failure due to acute COPD exacerbation: In setting of baseline supplemental oxygen requirement of 3 L continuous nasal cannula, presenting oxygen saturation noted to be in the low 70s while compliant with this baseline rate, and requiring 5 L nasal cannula  in order to maintain oxygen saturations in the mid 90s.  This appears most consistent with acute COPD exacerbation.  Clinically radiographically, presentation is less suggestive of acute on chronic diastolic heart failure.  Additionally, ACS appears less likely, in the absence of any associated chest pain, as well as negative high-sensitivity troponin I x2 in spite of symptoms over the course of the last 2 to 3 days, while EKG shows no evidence of acute ischemic changes.  Additionally, acute pulmonary embolism appears clinically less likely at this time.   Plan: Scheduled duo nebulizers every 6 hours.  As needed albuterol nebulizer.  Site Medrol 40 mg IV every 6 hours.  Continue azithromycin  for anti-inflammatory benefits as well as for benefit of administration of hospital course when using antibiotics in the setting of an acute COPD exacerbation.  Check VBG.  Monitor on telemetry.  Monitor continuous pulse oximetry.  Add on serum magnesium level.  Check phosphorus level.  Will attempt additional chart review to evaluate most recent pulmonary function testing as well as most recent echocardiogram findings.  Repeat CBC and BMP in the morning.     #) Acute anemia: Relative to historical baseline hemoglobin range of 12-13, present hemoglobin slightly lower at 10.4.  It is acknowledged that the acuity of this finding is not completely clear, as there appears to be a 2-year gap in hemoglobin data points.  No overt evidence of acute blood loss at this time.  Laboratory evaluation for patient's anemia initiated, as further described below.  Plan: Add on the following to presenting labs: Iron studies, MMA, folic acid, reticulocyte count.  Repeat CBC in the morning.  Monitor on telemetry.     #) Chronic diastolic heart failure: Documented history of such.  On torsemide 20 mg p.o. twice daily as this will outpatient diuretic medication.  Clinically radiographically, presentation appears less consistent with  acutely decompensated heart failure at this time.  Plan: Continue torsemide.  Monitor strict I's and O's and daily weights.  Monitor on telemetry.  Monitor continuous pulse oximetry.  Additional chart review to evaluate results of most recent echocardiogram.  Add on serum magnesium level.  Repeat BMP in the morning.      #) Essential hypertension: Outpatient hypertensive regimen includes losartan as well as Coreg.  Systolic blood pressures have been in the normotensive range since presentation.  Plan: Continue home losartan and Coreg.  Close monitoring of ensuing pressure via routine vital signs.      #) Type 2 diabetes mellitus: Non-insulin-dependent as an outpatient.  On Januvia at home.  Presenting blood sugar per presenting CMP noted to be 154.  Plan: Hold home Januvia during this hospitalization.  Accu-Cheks before every meal and at bedtime with low-dose sliding scale insulin.    DVT prophylaxis: Lovenox 40 mg subcu daily Code Status: Full code Family Communication: none Disposition Plan: Per Rounding Team Consults called: none  Admission status: Inpatient; med telemetry    Of note, this patient was added by me to the following Admit List/Treatment Team:  mcadmits.     PLEASE NOTE THAT DRAGON DICTATION SOFTWARE WAS USED IN THE CONSTRUCTION OF THIS NOTE.   Angie Fava DO [facility] Pager [phone] From 7PM - 7AM   03/31/2021, 9:37 PM

## 2021-03-31 NOTE — ED Notes (Signed)
Patient given urinal per request at this time

## 2021-03-31 NOTE — ED Notes (Signed)
Patient sitting at side of bed taking off leads stating he wants to go home. When asked why he states "I had to wait 2 hours to get ice and I am still hungry and no one is given me food". This RN informed patient that he was told multiple time previously that he could not eat/drink until he gets cleared by the physician. At this time patient has returned to bed is cooperating with care plan. Still requesting food. ED MD made aware.

## 2021-04-01 ENCOUNTER — Encounter (HOSPITAL_COMMUNITY): Payer: Self-pay | Admitting: Internal Medicine

## 2021-04-01 DIAGNOSIS — J9621 Acute and chronic respiratory failure with hypoxia: Secondary | ICD-10-CM | POA: Diagnosis present

## 2021-04-01 DIAGNOSIS — R0602 Shortness of breath: Secondary | ICD-10-CM | POA: Diagnosis present

## 2021-04-01 DIAGNOSIS — D649 Anemia, unspecified: Secondary | ICD-10-CM | POA: Diagnosis present

## 2021-04-01 DIAGNOSIS — I5032 Chronic diastolic (congestive) heart failure: Secondary | ICD-10-CM | POA: Diagnosis present

## 2021-04-01 LAB — CBG MONITORING, ED
Glucose-Capillary: 203 mg/dL — ABNORMAL HIGH (ref 70–99)
Glucose-Capillary: 159 mg/dL — ABNORMAL HIGH (ref 70–99)

## 2021-04-01 LAB — COMPREHENSIVE METABOLIC PANEL
ALT: 32 U/L (ref 0–44)
AST: 19 U/L (ref 15–41)
Albumin: 3.2 g/dL — ABNORMAL LOW (ref 3.5–5.0)
Alkaline Phosphatase: 93 U/L (ref 38–126)
Anion gap: 7 (ref 5–15)
BUN: 10 mg/dL (ref 6–20)
CO2: 41 mmol/L — ABNORMAL HIGH (ref 22–32)
Calcium: 8.9 mg/dL (ref 8.9–10.3)
Chloride: 93 mmol/L — ABNORMAL LOW (ref 98–111)
Creatinine, Ser: 0.91 mg/dL (ref 0.61–1.24)
GFR, Estimated: >60 mL/min (ref >60)
Glucose, Bld: 193 mg/dL — ABNORMAL HIGH (ref 70–99)
Potassium: 4.4 mmol/L (ref 3.5–5.1)
Sodium: 141 mmol/L (ref 135–145)
Total Bilirubin: 0.4 mg/dL (ref 0.3–1.2)
Total Protein: 7.8 g/dL (ref 6.5–8.1)

## 2021-04-01 LAB — CBC
HCT: 38.9 % — ABNORMAL LOW (ref 39.0–52.0)
Hemoglobin: 11.1 g/dL — ABNORMAL LOW (ref 13.0–17.0)
MCH: 23.5 pg — ABNORMAL LOW (ref 26.0–34.0)
MCHC: 28.5 g/dL — ABNORMAL LOW (ref 30.0–36.0)
MCV: 82.4 fL (ref 80.0–100.0)
Platelets: 214 10*3/uL (ref 150–400)
RBC: 4.72 MIL/uL (ref 4.22–5.81)
RDW: 18.9 % — ABNORMAL HIGH (ref 11.5–15.5)
WBC: 6.6 10*3/uL (ref 4.0–10.5)
nRBC: 0.3 % — ABNORMAL HIGH (ref 0.0–0.2)

## 2021-04-01 LAB — I-STAT ARTERIAL BLOOD GAS, ED
Acid-Base Excess: 16 mmol/L — ABNORMAL HIGH (ref 0.0–2.0)
Bicarbonate: 48.1 mmol/L — ABNORMAL HIGH (ref 20.0–28.0)
Calcium, Ion: 1.16 mmol/L (ref 1.15–1.40)
HCT: 35 % — ABNORMAL LOW (ref 39.0–52.0)
Hemoglobin: 11.9 g/dL — ABNORMAL LOW (ref 13.0–17.0)
O2 Saturation: 97 %
Patient temperature: 98.7
Potassium: 4.2 mmol/L (ref 3.5–5.1)
Sodium: 141 mmol/L (ref 135–145)
TCO2: >50 mmol/L — ABNORMAL HIGH (ref 22–32)
pCO2 arterial: 108.1 mmHg (ref 32.0–48.0)
pH, Arterial: 7.256 — ABNORMAL LOW (ref 7.350–7.450)
pO2, Arterial: 120 mmHg — ABNORMAL HIGH (ref 83.0–108.0)

## 2021-04-01 LAB — BLOOD GAS, VENOUS
Acid-Base Excess: 15.5 mmol/L — ABNORMAL HIGH (ref 0.0–2.0)
Bicarbonate: 42.9 mmol/L — ABNORMAL HIGH (ref 20.0–28.0)
Drawn by: 5810
FIO2: 40
O2 Saturation: 90.3 %
Patient temperature: 37.2
pCO2, Ven: 97.5 mmHg (ref 44.0–60.0)
pH, Ven: 7.267 (ref 7.250–7.430)
pO2, Ven: 66.9 mmHg — ABNORMAL HIGH (ref 32.0–45.0)

## 2021-04-01 LAB — TSH: TSH: 0.596 u[IU]/mL (ref 0.350–4.500)

## 2021-04-01 LAB — HEMOGLOBIN A1C
Hgb A1c MFr Bld: 7.7 % — ABNORMAL HIGH (ref 4.8–5.6)
Mean Plasma Glucose: 174.29 mg/dL

## 2021-04-01 LAB — GLUCOSE, CAPILLARY
Glucose-Capillary: 182 mg/dL — ABNORMAL HIGH (ref 70–99)
Glucose-Capillary: 173 mg/dL — ABNORMAL HIGH (ref 70–99)

## 2021-04-01 LAB — MAGNESIUM: Magnesium: 2.1 mg/dL (ref 1.7–2.4)

## 2021-04-01 LAB — PHOSPHORUS: Phosphorus: 4.8 mg/dL — ABNORMAL HIGH (ref 2.5–4.6)

## 2021-04-01 LAB — HIV ANTIBODY (ROUTINE TESTING W REFLEX): HIV Screen 4th Generation wRfx: NONREACTIVE

## 2021-04-01 MED ORDER — FOLIC ACID 1 MG PO TABS
1.0000 mg | ORAL_TABLET | Freq: Every day | ORAL | Status: DC
  Administered 2021-04-01 – 2021-04-04 (×4): 1 mg via ORAL
  Filled 2021-04-01 (×4): qty 1

## 2021-04-01 MED ORDER — LEVALBUTEROL HCL 0.63 MG/3ML IN NEBU
0.6300 mg | INHALATION_SOLUTION | Freq: Four times a day (QID) | RESPIRATORY_TRACT | Status: DC | PRN
  Filled 2021-04-01: qty 3

## 2021-04-01 MED ORDER — IPRATROPIUM BROMIDE 0.02 % IN SOLN
0.5000 mg | Freq: Four times a day (QID) | RESPIRATORY_TRACT | Status: DC
  Administered 2021-04-01 – 2021-04-02 (×3): 0.5 mg via RESPIRATORY_TRACT
  Filled 2021-04-01 (×3): qty 2.5

## 2021-04-01 MED ORDER — SODIUM CHLORIDE 0.9 % IV SOLN: 500.0000 mg | INTRAVENOUS | Status: DC

## 2021-04-01 MED ORDER — LOSARTAN POTASSIUM 50 MG PO TABS
25.0000 mg | ORAL_TABLET | Freq: Every day | ORAL | Status: DC
  Administered 2021-04-01 – 2021-04-04 (×4): 25 mg via ORAL
  Filled 2021-04-01 (×4): qty 1

## 2021-04-01 MED ORDER — ATORVASTATIN CALCIUM 10 MG PO TABS
10.0000 mg | ORAL_TABLET | Freq: Every day | ORAL | Status: DC
  Administered 2021-04-01 – 2021-04-04 (×4): 10 mg via ORAL
  Filled 2021-04-01 (×4): qty 1

## 2021-04-01 MED ORDER — LEVALBUTEROL HCL 0.63 MG/3ML IN NEBU
0.6300 mg | INHALATION_SOLUTION | Freq: Four times a day (QID) | RESPIRATORY_TRACT | Status: DC
  Filled 2021-04-01 (×2): qty 3

## 2021-04-01 MED ORDER — METHYLPREDNISOLONE SODIUM SUCC 40 MG IJ SOLR
40.0000 mg | Freq: Two times a day (BID) | INTRAMUSCULAR | Status: DC
  Administered 2021-04-02: 40 mg via INTRAVENOUS
  Filled 2021-04-01: qty 1

## 2021-04-01 MED ORDER — INSULIN ASPART 100 UNIT/ML ~~LOC~~ SOLN
0.0000 [IU] | Freq: Three times a day (TID) | SUBCUTANEOUS | Status: DC
  Administered 2021-04-01: 4 [IU] via SUBCUTANEOUS

## 2021-04-01 MED ORDER — FERROUS SULFATE 325 (65 FE) MG PO TABS
325.0000 mg | ORAL_TABLET | Freq: Every day | ORAL | Status: DC
  Administered 2021-04-01 – 2021-04-04 (×4): 325 mg via ORAL
  Filled 2021-04-01 (×4): qty 1

## 2021-04-01 MED ORDER — INSULIN ASPART 100 UNIT/ML ~~LOC~~ SOLN
0.0000 [IU] | Freq: Three times a day (TID) | SUBCUTANEOUS | Status: DC
  Administered 2021-04-01: 3 [IU] via SUBCUTANEOUS
  Administered 2021-04-01: 2 [IU] via SUBCUTANEOUS

## 2021-04-01 MED ORDER — ASPIRIN EC 81 MG PO TBEC
81.0000 mg | DELAYED_RELEASE_TABLET | Freq: Every day | ORAL | Status: DC
  Administered 2021-04-01 – 2021-04-04 (×4): 81 mg via ORAL
  Filled 2021-04-01 (×4): qty 1

## 2021-04-01 MED ORDER — VITAMIN B-12 1000 MCG PO TABS
1000.0000 ug | ORAL_TABLET | Freq: Every day | ORAL | Status: DC
  Administered 2021-04-01 – 2021-04-04 (×4): 1000 ug via ORAL
  Filled 2021-04-01 (×4): qty 1

## 2021-04-01 MED ORDER — AZITHROMYCIN 500 MG PO TABS
500.0000 mg | ORAL_TABLET | Freq: Every day | ORAL | Status: DC
  Administered 2021-04-01 – 2021-04-03 (×3): 500 mg via ORAL
  Filled 2021-04-01: qty 1
  Filled 2021-04-01: qty 2
  Filled 2021-04-01: qty 1

## 2021-04-01 MED ORDER — ENOXAPARIN SODIUM 100 MG/ML ~~LOC~~ SOLN
90.0000 mg | SUBCUTANEOUS | Status: DC
  Administered 2021-04-01 – 2021-04-03 (×3): 90 mg via SUBCUTANEOUS
  Filled 2021-04-01 (×3): qty 1

## 2021-04-01 MED ORDER — TORSEMIDE 20 MG PO TABS
20.0000 mg | ORAL_TABLET | Freq: Two times a day (BID) | ORAL | Status: DC
  Administered 2021-04-01 (×2): 20 mg via ORAL
  Filled 2021-04-01 (×2): qty 1

## 2021-04-01 MED ORDER — CARVEDILOL 25 MG PO TABS
25.0000 mg | ORAL_TABLET | Freq: Two times a day (BID) | ORAL | Status: DC
  Administered 2021-04-01 – 2021-04-04 (×7): 25 mg via ORAL
  Filled 2021-04-01 (×5): qty 1
  Filled 2021-04-01: qty 2
  Filled 2021-04-01: qty 1

## 2021-04-01 MED ORDER — INSULIN ASPART 100 UNIT/ML ~~LOC~~ SOLN: 0.0000 [IU] | Freq: Every day | SUBCUTANEOUS | Status: DC

## 2021-04-01 MED ORDER — IPRATROPIUM BROMIDE 0.02 % IN SOLN: 0.5000 mg | Freq: Four times a day (QID) | RESPIRATORY_TRACT | Status: DC

## 2021-04-01 MED ORDER — LEVALBUTEROL HCL 0.63 MG/3ML IN NEBU: 0.6300 mg | INHALATION_SOLUTION | Freq: Four times a day (QID) | RESPIRATORY_TRACT | Status: DC

## 2021-04-01 NOTE — ED Notes (Addendum)
Pt gets tachycardic on the monitor with any type of exertion. For example this RN drew patients blood and pt HR shot up to 170s. Pt returns to NSR and HR in the 80s at rest.  On a separate occasion this RN and [place] NT moved pt up in bed (per pt request) and pt HR shot up to 189 at one point. Pt then returns to NSR and HR in the 80s at rest. Pt reporting no CP at either of these times. Dr. Arlean Hopping notified. Dr. Arlean Hopping to  would like a repeat EKG done when pt starts to get tachycardic again.

## 2021-04-01 NOTE — ED Notes (Signed)
Lunch delivered, set up and pt eating at this time. 

## 2021-04-01 NOTE — ED Notes (Signed)
Dr. Nelson Chimes notified of CO2

## 2021-04-01 NOTE — Progress Notes (Signed)
PROGRESS NOTE    Edwin Mcclain  VOH:[id number] DOB: [birth year] DOA: 03/31/2021 PCP: Arvilla Market, DO   Brief Narrative: Taken from H&P. Edwin Mcclain is a 55 y.o. male with medical history significant for chronic hypoxic respiratory failure on continuous 3 L/min, COPD, chronic diastolic heart failure, hypertension, type 2 diabetes mellitus, who is admitted to [facility] on 03/31/2021 with acute on chronic hypoxic respiratory failure after presenting from home to [facility] ED complaining of shortness of breath.   The patient reports 3 days of shortness of breath associated mild wheezing.  He reports chronic edema in the bilateral lower extremities, without any recent worsening thereof.  He also denies any associated orthopnea or PND.  Denies any associated chest pain, palpitations, diaphoresis, nausea, vomiting, dizziness, presyncope, or syncope.   On presentation he was desaturating in 70s on his baseline supplemental oxygen of 3 L. Patient was more drowsy with hypercarbia this morning-saturation was in 90s on 5 L of oxygen, he was placed on BiPAP for hypercarbia.  Subjective: Patient was seen and examined.  On BiPAP.  No new complaint.  Per patient having mild increase in his baseline cough with some increased sputum production.  No fever or chills.  He was compliant with his home oxygen.  Assessment & Plan:   Principal Problem:   Acute exacerbation of chronic obstructive pulmonary disease (COPD) (HCC) Active Problems:   HTN (hypertension)   Type 2 diabetes mellitus with complication, without long-term current use of insulin (HCC)   Acute on chronic respiratory failure with hypoxia (HCC)   SOB (shortness of breath)   Anemia   Chronic diastolic CHF (congestive heart failure) (HCC)  Acute on chronic hypoxemic and hypercarbic respiratory failure secondary to COPD exacerbation.  Elevated CO2 requiring BiPAP.  Most likely a chronic retainer, baseline CO2 suspected around  60-80. Patient is morbidly obese and hypoventilation syndrome is also playing a role. Appears comfortable and no wheezing felt although difficult auscultation due to body habitus. Troponin and BNP within normal limit. -Continue with BiPAP and monitor ABGs. -Continue with Solu-Medrol-dose decreased to twice daily. -Continue with DuoNeb -Continue with Xopenex as needed -Continue with Zithromax for its anti-inflammatory effect -Continue supplemental oxygen to keep the saturation above 90%  Anemia.  Anemia panel with mildly decreased iron saturation, decreased folate, borderline B12.  Hemoglobin at 11.1, baseline around 12-13. -Start him on iron, folic acid and B12 supplement. -Monitor hemoglobin  Chronic diastolic heart failure.  Received 1 dose of IV Lasix in ED and was placed on home dose of torsemide.  Appears euvolemic. -Continue home dose of torsemide -Daily BMP -Strict intake and output  Essential hypertension.  Blood pressure within goal. -Continue home dose of Coreg and losartan  Type 2 diabetes mellitus.  On Januvia at home.  CBG elevated. -Check A1c -Resistant sliding scale-patient is morbidly obese and on steroid now  Class III obesity. Estimated body mass index is 56.49 kg/m as calculated from the following:   Height as of this encounter: 5\' 11"  (1.803 m).   Weight as of this encounter: 183.7 kg.  This will complicate overall prognosis.  Objective: Vitals:   04/01/21 1100 04/01/21 1130 04/01/21 1145 04/01/21 1404  BP: 110/60 128/76 113/67 136/70  Pulse: 74 72 73 83  Resp: 13 14 13 16   Temp:    98.9 F (37.2 C)  TempSrc:    Oral  SpO2: 99% 100% 99% 100%  Weight:      Height:  Intake/Output Summary (Last 24 hours) at 04/01/2021 1433 Last data filed at 03/31/2021 2259 Gross per 24 hour  Intake 350.68 ml  Output --  Net 350.68 ml   Filed Weights   03/31/21 1705  Weight: (!) 183.7 kg    Examination:  General exam: Morbidly obese gentleman, appears  calm and comfortable  Respiratory system: Clear to auscultation. Respiratory effort normal, difficult to assess due to body habitus. Cardiovascular system: S1 & S2 heard, RRR.  Gastrointestinal system: Soft, nontender, nondistended, bowel sounds positive. Central nervous system: Alert and oriented. No focal neurological deficits. Extremities: No edema, no cyanosis, pulses intact and symmetrical, signs of chronic venous dermatitis. Psychiatry: Judgement and insight appear normal.    DVT prophylaxis: Lovenox Code Status: Full Family Communication: Discussed with patient. Disposition Plan:  Status is: Inpatient  Remains inpatient appropriate because:Inpatient level of care appropriate due to severity of illness   Dispo: The patient is from: Home              Anticipated d/c is to: Home              Patient currently is not medically stable to d/c.   Difficult to place patient No              Level of care: Telemetry Medical  All the records are reviewed and case discussed with Care Management/Social Worker. Management plans discussed with the patient, nursing and they are in agreement.  Consultants:   none  Procedures:  Antimicrobials:  Zithromax  Data Reviewed: I have personally reviewed following labs and imaging studies  CBC: Recent Labs  Lab 03/31/21 1722 04/01/21 0629 04/01/21 1130  WBC 6.6 6.6  --   NEUTROABS 4.4  --   --   HGB 10.4* 11.1* 11.9*  HCT 35.3* 38.9* 35.0*  MCV 80.2 82.4  --   PLT 214 214  --    Basic Metabolic Panel: Recent Labs  Lab 03/31/21 1722 04/01/21 0629 04/01/21 1130  NA 140 141 141  K 4.1 4.4 4.2  CL 94* 93*  --   CO2 41* 41*  --   GLUCOSE 154* 193*  --   BUN 11 10  --   CREATININE 0.97 0.91  --   CALCIUM 8.9 8.9  --   MG  --  2.1  --   PHOS  --  4.8*  --    GFR: Estimated Creatinine Clearance: 157.6 mL/min (by C-G formula based on SCr of 0.91 mg/dL). Liver Function Tests: Recent Labs  Lab 03/31/21 1722 04/01/21 0629   AST 29 19  ALT 32 32  ALKPHOS 89 93  BILITOT 0.8 0.4  PROT 7.3 7.8  ALBUMIN 2.9* 3.2*   Recent Labs  Lab 03/31/21 1722  LIPASE 26   No results for input(s): AMMONIA in the last 168 hours. Coagulation Profile: No results for input(s): INR, PROTIME in the last 168 hours. Cardiac Enzymes: No results for input(s): CKTOTAL, CKMB, CKMBINDEX, TROPONINI in the last 168 hours. BNP (last 3 results) No results for input(s): PROBNP in the last 8760 hours. HbA1C: No results for input(s): HGBA1C in the last 72 hours. CBG: Recent Labs  Lab 04/01/21 0812 04/01/21 1243  GLUCAP 203* 159*   Lipid Profile: No results for input(s): CHOL, HDL, LDLCALC, TRIG, CHOLHDL, LDLDIRECT in the last 72 hours. Thyroid Function Tests: No results for input(s): TSH, T4TOTAL, FREET4, T3FREE, THYROIDAB in the last 72 hours. Anemia Panel: Recent Labs    03/31/21 2119  VITAMINB12 244  FOLATE 5.5*  FERRITIN 86  TIBC 316  IRON 49  RETICCTPCT 1.9   Sepsis Labs: No results for input(s): PROCALCITON, LATICACIDVEN in the last 168 hours.  Recent Results (from the past 240 hour(s))  SARS CORONAVIRUS 2 (TAT 6-24 HRS) Nasopharyngeal Nasopharyngeal Swab     Status: None   Collection Time: 03/31/21  5:22 PM   Specimen: Nasopharyngeal Swab  Result Value Ref Range Status   SARS Coronavirus 2 NEGATIVE NEGATIVE Final    Comment: (NOTE) SARS-CoV-2 target nucleic acids are NOT DETECTED.  The SARS-CoV-2 RNA is generally detectable in upper and lower respiratory specimens during the acute phase of infection. Negative results do not preclude SARS-CoV-2 infection, do not rule out co-infections with other pathogens, and should not be used as the sole basis for treatment or other patient management decisions. Negative results must be combined with clinical observations, patient history, and epidemiological information. The expected result is Negative.  Fact Sheet for  Patients: HairSlick.no  Fact Sheet for Healthcare Providers: quierodirigir.com  This test is not yet approved or cleared by the [place] FDA and  has been authorized for detection and/or diagnosis of SARS-CoV-2 by FDA under an Emergency Use Authorization (EUA). This EUA will remain  in effect (meaning this test can be used) for the duration of the COVID-19 declaration under Se ction 564(b)(1) of the Act, 21 U.S.C. section 360bbb-3(b)(1), unless the authorization is terminated or revoked sooner.  Performed at [facility], [address] [place], [place] [address]      Radiology Studies: DG Chest Portable 1 View  Result Date: 03/31/2021 CLINICAL DATA:  Chest pain, short of breath EXAM: PORTABLE CHEST 1 VIEW COMPARISON:  02/07/2019 FINDINGS: Single frontal view of the chest demonstrates chronic enlargement the cardiac silhouette. There is central vascular congestion without airspace disease, effusion, or pneumothorax. No acute bony abnormalities. IMPRESSION: 1. Chronic enlargement the cardiac silhouette. 2. Pulmonary vascular congestion without overt edema. Electronically Signed   By: Sharlet Salina M.D.   On: 03/31/2021 17:59    Scheduled Meds: . aspirin EC  81 mg Oral Daily  . atorvastatin  10 mg Oral Daily  . azithromycin  500 mg Oral Daily  . carvedilol  25 mg Oral BID WC  . enoxaparin (LOVENOX) injection  40 mg Subcutaneous Q24H  . ferrous sulfate  325 mg Oral Q breakfast  . folic acid  1 mg Oral Daily  . insulin aspart  0-9 Units Subcutaneous TID WC  . ipratropium  0.5 mg Nebulization Q6H  . levalbuterol  0.63 mg Nebulization Q6H  . losartan  25 mg Oral Daily  . methylPREDNISolone (SOLU-MEDROL) injection  40 mg Intravenous Q6H  . torsemide  20 mg Oral BID  . vitamin B-12  1,000 mcg Oral Daily   Continuous Infusions:   LOS: 1 day   Time spent: 45 minutes. More than 50% of the time was spent in  counseling/coordination of care  Arnetha Courser, MD [facility]  If 7PM-7AM, please contact night-coverage [email or web address]  04/01/2021, 2:33 PM   This record has been created using Conservation officer, historic buildings. Errors have been sought and corrected,but may not always be located. Such creation errors do not reflect on the standard of care.

## 2021-04-01 NOTE — ED Notes (Signed)
Tried calling report, Charge hasnt assigned a nurse yet. Will call back.

## 2021-04-01 NOTE — ED Notes (Signed)
Tried calling report, RN taking another PT to dialysis. Will call back

## 2021-04-02 ENCOUNTER — Other Ambulatory Visit (HOSPITAL_COMMUNITY): Payer: 59

## 2021-04-02 ENCOUNTER — Telehealth: Payer: Self-pay | Admitting: Critical Care Medicine

## 2021-04-02 ENCOUNTER — Inpatient Hospital Stay (HOSPITAL_COMMUNITY): Payer: 59

## 2021-04-02 ENCOUNTER — Encounter (HOSPITAL_COMMUNITY): Payer: 59

## 2021-04-02 DIAGNOSIS — J441 Chronic obstructive pulmonary disease with (acute) exacerbation: Secondary | ICD-10-CM | POA: Diagnosis not present

## 2021-04-02 DIAGNOSIS — J9621 Acute and chronic respiratory failure with hypoxia: Secondary | ICD-10-CM | POA: Diagnosis not present

## 2021-04-02 DIAGNOSIS — G4733 Obstructive sleep apnea (adult) (pediatric): Secondary | ICD-10-CM | POA: Diagnosis not present

## 2021-04-02 DIAGNOSIS — E662 Morbid (severe) obesity with alveolar hypoventilation: Secondary | ICD-10-CM | POA: Diagnosis not present

## 2021-04-02 DIAGNOSIS — I5032 Chronic diastolic (congestive) heart failure: Secondary | ICD-10-CM

## 2021-04-02 DIAGNOSIS — J9622 Acute and chronic respiratory failure with hypercapnia: Secondary | ICD-10-CM

## 2021-04-02 DIAGNOSIS — I10 Essential (primary) hypertension: Secondary | ICD-10-CM

## 2021-04-02 LAB — BASIC METABOLIC PANEL
Anion gap: 9 (ref 5–15)
BUN: 21 mg/dL — ABNORMAL HIGH (ref 6–20)
CO2: 40 mmol/L — ABNORMAL HIGH (ref 22–32)
Calcium: 8.7 mg/dL — ABNORMAL LOW (ref 8.9–10.3)
Chloride: 92 mmol/L — ABNORMAL LOW (ref 98–111)
Creatinine, Ser: 1.18 mg/dL (ref 0.61–1.24)
GFR, Estimated: >60 mL/min (ref >60)
Glucose, Bld: 162 mg/dL — ABNORMAL HIGH (ref 70–99)
Potassium: 4.6 mmol/L (ref 3.5–5.1)
Sodium: 141 mmol/L (ref 135–145)

## 2021-04-02 LAB — GLUCOSE, CAPILLARY
Glucose-Capillary: 117 mg/dL — ABNORMAL HIGH (ref 70–99)
Glucose-Capillary: 117 mg/dL — ABNORMAL HIGH (ref 70–99)
Glucose-Capillary: 119 mg/dL — ABNORMAL HIGH (ref 70–99)
Glucose-Capillary: 138 mg/dL — ABNORMAL HIGH (ref 70–99)
Glucose-Capillary: 153 mg/dL — ABNORMAL HIGH (ref 70–99)
Glucose-Capillary: 177 mg/dL — ABNORMAL HIGH (ref 70–99)

## 2021-04-02 LAB — BLOOD GAS, ARTERIAL
Acid-Base Excess: 17.6 mmol/L — ABNORMAL HIGH (ref 0.0–2.0)
Bicarbonate: 44.4 mmol/L — ABNORMAL HIGH (ref 20.0–28.0)
Drawn by: 60087
FIO2: 40
O2 Saturation: 97.9 %
Patient temperature: 36.7
pCO2 arterial: 86.5 mmHg (ref 32.0–48.0)
pH, Arterial: 7.329 — ABNORMAL LOW (ref 7.350–7.450)
pO2, Arterial: 110 mmHg — ABNORMAL HIGH (ref 83.0–108.0)

## 2021-04-02 MED ORDER — FUROSEMIDE 10 MG/ML IJ SOLN
40.0000 mg | Freq: Two times a day (BID) | INTRAMUSCULAR | Status: DC
  Administered 2021-04-02 – 2021-04-04 (×4): 40 mg via INTRAVENOUS
  Filled 2021-04-02 (×4): qty 4

## 2021-04-02 MED ORDER — ARFORMOTEROL TARTRATE 15 MCG/2ML IN NEBU: 15.0000 ug | INHALATION_SOLUTION | Freq: Two times a day (BID) | RESPIRATORY_TRACT | Status: DC

## 2021-04-02 MED ORDER — UMECLIDINIUM BROMIDE 62.5 MCG/INH IN AEPB
1.0000 | INHALATION_SPRAY | Freq: Every day | RESPIRATORY_TRACT | Status: DC
  Filled 2021-04-02: qty 7

## 2021-04-02 MED ORDER — FUROSEMIDE 10 MG/ML IJ SOLN: 40.0000 mg | Freq: Once | INTRAMUSCULAR | Status: AC

## 2021-04-02 MED ORDER — FUROSEMIDE 10 MG/ML IJ SOLN
INTRAMUSCULAR | Status: AC
  Administered 2021-04-02: 40 mg via INTRAVENOUS
  Filled 2021-04-02: qty 4

## 2021-04-02 NOTE — Consult Note (Signed)
[name], MRN:  [id number], DOB:  11/06/1966, LOS: 2 ADMISSION DATE:  03/31/2021, CONSULTATION DATE:  4/13 REFERRING MD:  Dr. Jerral Ralph, CHIEF COMPLAINT:  OSA  History of Present Illness:  55 year old male with past medical history as below, which is significant for COPD and chronic diastolic heart failure on home O2, hypertension, diabetes mellitus, and morbid obesity. Social smoker.  He presented months ago emergency department on 4/11 with complaints of shortness of breath x3 days with associated mild wheeze.  He also complained of nonproductive cough and increased lower extremity edema.  He denied fevers, chills, and orthopnea.  Patient presented to the emergency department via EMS.  Upon EMS arrival to his place of residence O2 sats were in the the 70s on 3 L, which improved to the 90s on 5 L.  Ultimately he was started on BiPAP in the ED. He was admitted to the telemetry unit for COPD exacerbation, acute on chronic diastolic congestive heart failure and has been treated with steroids, nebs, and diuretics. He improved significantly with 24 hours of treatment and QHS BiPAP. PCCM consulted for long term planning and outpatient OSA tx.   Pertinent  Medical History   has a past medical history of Acute diastolic HF (heart failure) (HCC) (02/08/2019), Acute respiratory failure with hypoxia (HCC) (02/08/2019), CHF (congestive heart failure) (HCC), COPD (chronic obstructive pulmonary disease) (HCC), HTN (hypertension), and Morbid obesity (HCC).   Significant Hospital Events: Including procedures, antibiotic start and stop dates in addition to other pertinent events   . 4/11 admit for COPD/CHF/OSA  . 4/13 PCCM consult  Interim History / Subjective:    Objective   Blood pressure 119/63, pulse (!) 55, temperature 98.7 F (37.1 C), temperature source Oral, resp. rate 14, height 5\' 11"  (1.803 m), weight (!) 184.6 kg, SpO2 100 %.    FiO2 (%):  [40 %] 40 %   Intake/Output Summary (Last 24  hours) at 04/02/2021 1123 Last data filed at 04/02/2021 0853 Gross per 24 hour  Intake --  Output 1550 ml  Net -1550 ml   Filed Weights   03/31/21 1705 04/02/21 0440  Weight: (!) 183.7 kg (!) 184.6 kg    Examination: General: Morbidly obese middle aged male in NAD HENT: [place]/AT, PERRL, unable to appreciate JVD due to neck girth.  Lungs: Clear bilateral breath sounds. Distant.  Cardiovascular: RRR, no MRG Abdomen: Soft, non-tender, non-distended Extremities: Chronic edema and venous stasis. No deformity or acute ROM limitation.  Neuro: Alert, oriented, non-focal  Labs/imaging that I have personally reviewed  (right click and "Reselect all SmartList Selections" daily)   Polysomnogram from 2020 with AHI 70.8/hr and desaturations down to 51%. CPAP recommended with optimal PAP 17 cm/H20 and 3lpm bleed in.   Resolved Hospital Problem list     Assessment & Plan:   Acute on chronic mixed respiratory failure: multifactorial in the setting of acute on chronic HFpEF and decompensated OSA. His smoking history is social. Going months between cigarettes. No PFT on file or home COPD therapies. I question his diagnosis of COPD. Sleep study from 2020 shows OSA needing CPAP, but ABG with chronic hypercapnia would indicate he needs home non-invasive ventilator.  - Continue QHS and PRN non-invasive mechanical ventilation - Stop steroids/nebs, may need to adjust insulin dosing accordingly. PRN xopenex ok to keep.  - Continue to diurese as you are. Agree with updated echo.  - Agree with TOC consult for home NIV - Will need follow up in the pulmonary clinic.  -  PFT at some point would be helpful to evaluate for obstructive disease.   HTN HLD DM - per primary   Best practice (right click and "Reselect all SmartList Selections" daily)  Per primary  Labs   CBC: Recent Labs  Lab 03/31/21 1722 04/01/21 0629 04/01/21 1130  WBC 6.6 6.6  --   NEUTROABS 4.4  --   --   HGB 10.4* 11.1* 11.9*  HCT  35.3* 38.9* 35.0*  MCV 80.2 82.4  --   PLT 214 214  --     Basic Metabolic Panel: Recent Labs  Lab 03/31/21 1722 04/01/21 0629 04/01/21 1130 04/02/21 0248  NA 140 141 141 141  K 4.1 4.4 4.2 4.6  CL 94* 93*  --  92*  CO2 41* 41*  --  40*  GLUCOSE 154* 193*  --  162*  BUN 11 10  --  21*  CREATININE 0.97 0.91  --  1.18  CALCIUM 8.9 8.9  --  8.7*  MG  --  2.1  --   --   PHOS  --  4.8*  --   --    GFR: Estimated Creatinine Clearance: 121.9 mL/min (by C-G formula based on SCr of 1.18 mg/dL). Recent Labs  Lab 03/31/21 1722 04/01/21 0629  WBC 6.6 6.6    Liver Function Tests: Recent Labs  Lab 03/31/21 1722 04/01/21 0629  AST 29 19  ALT 32 32  ALKPHOS 89 93  BILITOT 0.8 0.4  PROT 7.3 7.8  ALBUMIN 2.9* 3.2*   Recent Labs  Lab 03/31/21 1722  LIPASE 26   No results for input(s): AMMONIA in the last 168 hours.  ABG    Component Value Date/Time   PHART 7.329 (L) 04/02/2021 0507   PCO2ART 86.5 (HH) 04/02/2021 0507   PO2ART 110 (H) 04/02/2021 0507   HCO3 44.4 (H) 04/02/2021 0507   TCO2 >50 (H) 04/01/2021 1130   O2SAT 97.9 04/02/2021 0507     Coagulation Profile: No results for input(s): INR, PROTIME in the last 168 hours.  Cardiac Enzymes: No results for input(s): CKTOTAL, CKMB, CKMBINDEX, TROPONINI in the last 168 hours.  HbA1C: Hemoglobin A1C  Date/Time Value Ref Range Status  09/05/2020 11:13 AM 7.1 (A) 4.0 - 5.6 % Final   Hgb A1c MFr Bld  Date/Time Value Ref Range Status  04/01/2021 06:29 AM 7.7 (H) 4.8 - 5.6 % Final    Comment:    REPEATED TO VERIFY (NOTE) Pre diabetes:          5.7%-6.4%  Diabetes:              >6.4%  Glycemic control for   <7.0% adults with diabetes   09/12/2019 09:26 AM 7.2 (H) 4.8 - 5.6 % Final    Comment:             Prediabetes: 5.7 - 6.4          Diabetes: >6.4          Glycemic control for adults with diabetes: <7.0     CBG: Recent Labs  Lab 04/01/21 1604 04/01/21 2050 04/02/21 0025 04/02/21 0438  04/02/21 0821  GLUCAP 182* 173* 177* 153* 119*    Review of Systems:   Bolds are positive  Constitutional: weight loss, gain, night sweats, Fevers, chills, fatigue .  HEENT: headaches, Sore throat, sneezing, nasal congestion, post nasal drip, Difficulty swallowing, Tooth/dental problems, visual complaints visual changes, ear ache CV:  chest pain, radiates:,Orthopnea, PND, swelling in lower extremities, dizziness, palpitations, syncope.  GI  heartburn, indigestion, abdominal pain, nausea, vomiting, diarrhea, change in bowel habits, loss of appetite, bloody stools Resp: cough, non productive:, hemoptysis, dyspnea, chest pain, pleuritic.  Skin: rash or itching or icterus GU: dysuria, change in color of urine, urgency or frequency. Flank pain, hematuria  MS: joint pain or swelling. decreased range of motion  Psych: change in mood or affect. depression or anxiety.  Neuro: difficulty with speech, weakness, numbness, ataxia    Past Medical History:  He,  has a past medical history of Acute diastolic HF (heart failure) (HCC) (02/08/2019), Acute respiratory failure with hypoxia (HCC) (02/08/2019), CHF (congestive heart failure) (HCC), COPD (chronic obstructive pulmonary disease) (HCC), HTN (hypertension), and Morbid obesity (HCC).   Surgical History:   Past Surgical History:  Procedure Laterality Date  . C-spine surgery       Social History:   reports that he has never smoked. He has never used smokeless tobacco. He reports that he does not drink alcohol and does not use drugs.   Family History:  His family history includes Cancer in his mother; Leukemia in his father.   Allergies No Known Allergies   Home Medications  Prior to Admission medications   Medication Sig Start Date End Date Taking? Authorizing Provider  aspirin 81 MG EC tablet Take 1 tablet (81 mg total) by mouth daily. 12/12/19  Yes McClung, Angela M, PA-C  atorvastatin (LIPITOR) 10 MG tablet TAKE 1 TABLET(10 MG) BY MOUTH  DAILY Patient taking differently: Take 10 mg by mouth daily. 02/05/20  Yes Marcine Matar, MD  carvedilol (COREG) 25 MG tablet Take 1 tablet (25 mg total) by mouth 2 (two) times daily with a meal. 03/11/21  Yes Rollene Rotunda, MD  iron polysaccharides (NIFEREX) 150 MG capsule Take 150 mg by mouth daily. 07/18/19  Yes [provider]  losartan (COZAAR) 25 MG tablet TAKE 1 TABLET(25 MG) BY MOUTH DAILY Patient taking differently: Take 25 mg by mouth daily. 03/20/21  Yes Ivonne Andrew, NP  Melatonin 10 MG TABS Take 1 tablet by mouth at bedtime as needed (sleep).   Yes [provider]  torsemide (DEMADEX) 20 MG tablet TAKE 1 TABLET BY MOUTH TWICE DAILY Patient taking differently: Take 20 mg by mouth 2 (two) times daily. 02/12/20  Yes Marcine Matar, MD  Lancet Devices (ONE TOUCH DELICA LANCING DEV) MISC Use as directed. Dx: E10.9, E11.9 10/17/19   Marcine Matar, MD  meloxicam (MOBIC) 7.5 MG tablet Take 1 tablet (7.5 mg total) by mouth daily. Patient not taking: Reported on 04/02/2021 04/29/20   Arvilla Market, DO  OneTouch Delica Lancets 30G MISC 1 each by Does not apply route daily. Dx: E10.9, E11.9 10/17/19   Marcine Matar, MD  sitaGLIPtin (JANUVIA) 25 MG tablet Take 1 tablet (25 mg total) by mouth daily. Patient not taking: Reported on 04/02/2021 09/05/20   Arvilla Market, DO      Joneen Roach, AGACNP-BC [facility]  See Amion for personal pager PCCM on call pager [phone] until 7pm. Please call Elink 7p-7a. [phone]  04/02/2021 12:25 PM

## 2021-04-02 NOTE — Telephone Encounter (Signed)
Please schedule a new patient appointment in the next 1-2 months with a sleep medicine provider.  Steffanie Dunn, DO 04/02/21 3:39 PM [facility]

## 2021-04-02 NOTE — Telephone Encounter (Signed)
Patient is currently admitted. I have went ahead and scheduled patient with Dr. Craige Cotta in May. Nothing further needed.

## 2021-04-02 NOTE — Progress Notes (Addendum)
PROGRESS NOTE        PATIENT DETAILS Name: Edwin Mcclain Age: 55 y.o. Sex: male Date of Birth: [birth year] Admit Date: 03/31/2021 Admitting Physician Angie Fava, DO WUJ:[id number], Kandee Keen, DO  Brief Narrative: Patient is a 55 y.o. male with history of COPD, probable OSA/OHS, chronic hypoxic respiratory failure on 3 L of oxygen, chronic diastolic heart failure, lymphedema, HTN, DM-2 who presented with worsening shortness of breath-was found to have acute on chronic hypoxic/hypercarbic respiratory failure-he was placed on BiPAP and admitted to the hospitalist service.  See below for further details.  Significant events: 4/11>> admit to [facility] for acute on chronic hypercarbic/hypoxic respiratory failure requiring BiPAP.  Significant studies: 4/11>> chest x-ray: No obvious pneumonia-pulmonary vascular congestion  Antimicrobial therapy: Rocephin: 4/11 x 1 Zithromax: 4/11>>  Microbiology data: 4/11>> Covid PCR: Negative  Procedures : None  Consults: PCCM  DVT Prophylaxis : Lovenox prophylactically   Subjective: Feels better-liberated off BiPAP earlier this morning.   Assessment/Plan: Acute on chronic hypercarbic/hypoxic respiratory failure: Due to COPD/OHS/OSA exacerbation.  ABG this morning in spite of being on BiPAP overnight-still with severe hypercarbia-which I suspect is a chronic issue.  He was liberated of BiPAP this morning.  He will need BiPAP/trilogy machine on discharge-have consulted case management.  Have also consulted PCCM.  COPD exacerbation: Moving air well-few scattered rhonchi-start tapering steroids, continue bronchodilators-plan on continuing Zithromax for 2 additional days.  Chronic diastolic heart failure: Appears to have some sort of chronic lymphedema at baseline-given worsening respiratory status-we will maintain on IV Lasix for few more days.  We will get a updated echocardiogram--and check a lower extremity Doppler  to ensure he does not have DVT.  Normocytic anemia: Mild-stable for outpatient  HTN: BP stable-continue Coreg/losartan.  HLD: Continue statin  DM-2 (A1c 7.7 on 4/12): CBG stable-continue SSI-resume oral hypoglycemic agents on discharge.  CBG (last 3)  Recent Labs    04/02/21 0025 04/02/21 0438 04/02/21 0821  GLUCAP 177* 153* 119*   Presumed OSA/OHS: Unclear to me whether patient has had a formal diagnosis-but with given hypercarbia-suspect that this has been ongoing for a while.  Plans to consult PCCM while inpatient-and plan to discharge patient on positive pressure ventilation on discharge.   Morbid Obesity: Estimated body mass index is 56.76 kg/m as calculated from the following:   Height as of this encounter: 5\' 11"  (1.803 m).   Weight as of this encounter: 184.6 kg.    Diet: Diet Order            Diet Heart Room service appropriate? Yes; Fluid consistency: Thin  Diet effective now                  Code Status: Full code   Family Communication: None at bedside   Disposition Plan: Home vs Home with Home health vs SNF when ready for discharge Status is: Inpatient  Remains inpatient appropriate because:Inpatient level of care appropriate due to severity of illness   Dispo: The patient is from: Home              Anticipated d/c is to: Home              Patient currently is not medically stable to d/c.   Difficult to place patient No   Barriers to Discharge: Acute on chronic hypoxic/hypercarbic respiratory failure requiring BiPAP-being transitioned to nasal  cannula today.  Antimicrobial agents: Anti-infectives (From admission, onward)   Start     Dose/Rate Route Frequency Ordered Stop   04/01/21 2000  azithromycin (ZITHROMAX) 500 mg in sodium chloride 0.9 % 250 mL IVPB  Status:  Discontinued        500 mg 250 mL/hr over 60 Minutes Intravenous Every 24 hours 04/01/21 0537 04/01/21 0841   04/01/21 1000  azithromycin (ZITHROMAX) tablet 500 mg        500  mg Oral Daily 04/01/21 0841     03/31/21 1945  azithromycin (ZITHROMAX) 500 mg in sodium chloride 0.9 % 250 mL IVPB        500 mg 250 mL/hr over 60 Minutes Intravenous  Once 03/31/21 1931 03/31/21 2259   03/31/21 1945  cefTRIAXone (ROCEPHIN) 1 g in sodium chloride 0.9 % 100 mL IVPB        1 g 200 mL/hr over 30 Minutes Intravenous  Once 03/31/21 1931 03/31/21 2054       Time spent: 35 minutes-Greater than 50% of this time was spent in counseling, explanation of diagnosis, planning of further management, and coordination of care.  MEDICATIONS: Scheduled Meds: . aspirin EC  81 mg Oral Daily  . atorvastatin  10 mg Oral Daily  . azithromycin  500 mg Oral Daily  . carvedilol  25 mg Oral BID WC  . enoxaparin (LOVENOX) injection  90 mg Subcutaneous Q24H  . ferrous sulfate  325 mg Oral Q breakfast  . folic acid  1 mg Oral Daily  . insulin aspart  0-20 Units Subcutaneous TID WC  . insulin aspart  0-5 Units Subcutaneous QHS  . losartan  25 mg Oral Daily  . methylPREDNISolone (SOLU-MEDROL) injection  40 mg Intravenous Q12H  . vitamin B-12  1,000 mcg Oral Daily   Continuous Infusions: PRN Meds:.acetaminophen **OR** acetaminophen, levalbuterol   PHYSICAL EXAM: Vital signs: Vitals:   04/02/21 0100 04/02/21 0400 04/02/21 0440 04/02/21 0815  BP:  91/60  119/63  Pulse: 66 70  (!) 55  Resp: 14 14  14   Temp:  98.1 F (36.7 C)  98.7 F (37.1 C)  TempSrc:  Axillary  Oral  SpO2: 100% 100%  100%  Weight:   (!) 184.6 kg   Height:       Filed Weights   03/31/21 1705 04/02/21 0440  Weight: (!) 183.7 kg (!) 184.6 kg   Body mass index is 56.76 kg/m.   Gen Exam:Alert awake-not in any distress HEENT:atraumatic, normocephalic Chest: B/L clear to auscultation anteriorly-few scattered rhonchi CVS:S1S2 regular Abdomen:soft non tender, non distended Extremities:+++edema-appears to have chronic lymphedema at baseline Neurology: Non focal Skin: no rash  I have personally reviewed following  labs and imaging studies  LABORATORY DATA: CBC: Recent Labs  Lab 03/31/21 1722 04/01/21 0629 04/01/21 1130  WBC 6.6 6.6  --   NEUTROABS 4.4  --   --   HGB 10.4* 11.1* 11.9*  HCT 35.3* 38.9* 35.0*  MCV 80.2 82.4  --   PLT 214 214  --     Basic Metabolic Panel: Recent Labs  Lab 03/31/21 1722 04/01/21 0629 04/01/21 1130 04/02/21 0248  NA 140 141 141 141  K 4.1 4.4 4.2 4.6  CL 94* 93*  --  92*  CO2 41* 41*  --  40*  GLUCOSE 154* 193*  --  162*  BUN 11 10  --  21*  CREATININE 0.97 0.91  --  1.18  CALCIUM 8.9 8.9  --  8.7*  MG  --  2.1  --   --   PHOS  --  4.8*  --   --     GFR: Estimated Creatinine Clearance: 121.9 mL/min (by C-G formula based on SCr of 1.18 mg/dL).  Liver Function Tests: Recent Labs  Lab 03/31/21 1722 04/01/21 0629  AST 29 19  ALT 32 32  ALKPHOS 89 93  BILITOT 0.8 0.4  PROT 7.3 7.8  ALBUMIN 2.9* 3.2*   Recent Labs  Lab 03/31/21 1722  LIPASE 26   No results for input(s): AMMONIA in the last 168 hours.  Coagulation Profile: No results for input(s): INR, PROTIME in the last 168 hours.  Cardiac Enzymes: No results for input(s): CKTOTAL, CKMB, CKMBINDEX, TROPONINI in the last 168 hours.  BNP (last 3 results) No results for input(s): PROBNP in the last 8760 hours.  Lipid Profile: No results for input(s): CHOL, HDL, LDLCALC, TRIG, CHOLHDL, LDLDIRECT in the last 72 hours.  Thyroid Function Tests: Recent Labs    04/01/21 0426  TSH 0.596    Anemia Panel: Recent Labs    03/31/21 2119  VITAMINB12 244  FOLATE 5.5*  FERRITIN 86  TIBC 316  IRON 49  RETICCTPCT 1.9    Urine analysis: No results found for: COLORURINE, APPEARANCEUR, LABSPEC, PHURINE, GLUCOSEU, HGBUR, BILIRUBINUR, KETONESUR, PROTEINUR, UROBILINOGEN, NITRITE, LEUKOCYTESUR  Sepsis Labs: Lactic Acid, Venous No results found for: LATICACIDVEN  MICROBIOLOGY: Recent Results (from the past 240 hour(s))  SARS CORONAVIRUS 2 (TAT 6-24 HRS) Nasopharyngeal Nasopharyngeal  Swab     Status: None   Collection Time: 03/31/21  5:22 PM   Specimen: Nasopharyngeal Swab  Result Value Ref Range Status   SARS Coronavirus 2 NEGATIVE NEGATIVE Final    Comment: (NOTE) SARS-CoV-2 target nucleic acids are NOT DETECTED.  The SARS-CoV-2 RNA is generally detectable in upper and lower respiratory specimens during the acute phase of infection. Negative results do not preclude SARS-CoV-2 infection, do not rule out co-infections with other pathogens, and should not be used as the sole basis for treatment or other patient management decisions. Negative results must be combined with clinical observations, patient history, and epidemiological information. The expected result is Negative.  Fact Sheet for Patients: HairSlick.nohttps://www.fda.gov/media/138098/download  Fact Sheet for Healthcare Providers: quierodirigir.comhttps://www.fda.gov/media/138095/download  This test is not yet approved or cleared by the [place] FDA and  has been authorized for detection and/or diagnosis of SARS-CoV-2 by FDA under an Emergency Use Authorization (EUA). This EUA will remain  in effect (meaning this test can be used) for the duration of the COVID-19 declaration under Se ction 564(b)(1) of the Act, 21 U.S.C. section 360bbb-3(b)(1), unless the authorization is terminated or revoked sooner.  Performed at [facility], [address] [place], [place] [address]     RADIOLOGY STUDIES/RESULTS: DG Chest Portable 1 View  Result Date: 03/31/2021 CLINICAL DATA:  Chest pain, short of breath EXAM: PORTABLE CHEST 1 VIEW COMPARISON:  02/07/2019 FINDINGS: Single frontal view of the chest demonstrates chronic enlargement the cardiac silhouette. There is central vascular congestion without airspace disease, effusion, or pneumothorax. No acute bony abnormalities. IMPRESSION: 1. Chronic enlargement the cardiac silhouette. 2. Pulmonary vascular congestion without overt edema. Electronically Signed   By: Sharlet SalinaMichael  Brown  M.D.   On: 03/31/2021 17:59     LOS: 2 days   Jeoffrey MassedShanker Ria Redcay, MD  [facility]    To contact the attending provider between 7A-7P or the covering provider during after hours 7P-7A, please log into the web site [email or web address] and access using universal [facility] password for that web site. If you do not have the password, please call the hospital operator.  04/02/2021, 11:27 AM

## 2021-04-02 NOTE — Care Management (Signed)
Patient continues to exhibit signs of hypercapnia associated with chronic respiratory failure secondary to severe COPD.  Interruption or failure to provide NIV would quickly lead to exacerbation of the patient's condition, hospital admission, and likely harm to the patient. Continued use is preferred.  The use of the NIV will treat patient's high PC02 levels and can reduce risk of exacerbations and future hospitalizations when used at night and during the day.  BiLevel/RAD has been tried previously and has proven ineffective at managing this patient's hypercapnia.  Ventilation is required to decrease the work of breathing and improve pulmonary status. Interruption of ventilator support would lead to decline of health status.  Patient is able to protect their airways and clear secretions on their own.    

## 2021-04-03 ENCOUNTER — Inpatient Hospital Stay (HOSPITAL_COMMUNITY): Payer: 59

## 2021-04-03 DIAGNOSIS — R609 Edema, unspecified: Secondary | ICD-10-CM

## 2021-04-03 DIAGNOSIS — M7989 Other specified soft tissue disorders: Secondary | ICD-10-CM

## 2021-04-03 DIAGNOSIS — R0603 Acute respiratory distress: Secondary | ICD-10-CM

## 2021-04-03 LAB — BASIC METABOLIC PANEL
Anion gap: 4 — ABNORMAL LOW (ref 5–15)
BUN: 23 mg/dL — ABNORMAL HIGH (ref 6–20)
CO2: 47 mmol/L — ABNORMAL HIGH (ref 22–32)
Calcium: 8.5 mg/dL — ABNORMAL LOW (ref 8.9–10.3)
Chloride: 89 mmol/L — ABNORMAL LOW (ref 98–111)
Creatinine, Ser: 1.19 mg/dL (ref 0.61–1.24)
GFR, Estimated: >60 mL/min (ref >60)
Glucose, Bld: 110 mg/dL — ABNORMAL HIGH (ref 70–99)
Potassium: 3.7 mmol/L (ref 3.5–5.1)
Sodium: 140 mmol/L (ref 135–145)

## 2021-04-03 LAB — GLUCOSE, CAPILLARY
Glucose-Capillary: 106 mg/dL — ABNORMAL HIGH (ref 70–99)
Glucose-Capillary: 113 mg/dL — ABNORMAL HIGH (ref 70–99)
Glucose-Capillary: 128 mg/dL — ABNORMAL HIGH (ref 70–99)
Glucose-Capillary: 139 mg/dL — ABNORMAL HIGH (ref 70–99)

## 2021-04-03 LAB — ECHOCARDIOGRAM COMPLETE
AR max vel: 4.8 cm2
AV Area VTI: 4.97 cm2
AV Area mean vel: 4.77 cm2
AV Mean grad: 8 mmHg
AV Peak grad: 13.1 mmHg
Ao pk vel: 1.81 m/s
Area-P 1/2: 3.48 cm2
Height: 71 in
S' Lateral: 3.1 cm
Weight: 6515.03 oz

## 2021-04-03 MED ORDER — PERFLUTREN LIPID MICROSPHERE
1.0000 mL | INTRAVENOUS | Status: AC | PRN
  Administered 2021-04-03: 2 mL via INTRAVENOUS
  Filled 2021-04-03: qty 10

## 2021-04-03 NOTE — Progress Notes (Signed)
PROGRESS NOTE        PATIENT DETAILS Name: Edwin Mcclain Age: 55 y.o. Sex: male Date of Birth: 09-26-1966 Admit Date: 03/31/2021 Admitting Physician Angie FavaJustin B Howerter, DO ZOX:[id number]:Wallace, Kandee Keenatherine Lauren, DO  Brief Narrative: Patient is a 55 y.o. male with history of COPD, probable OSA/OHS, chronic hypoxic respiratory failure on 3 L of oxygen, chronic diastolic heart failure, lymphedema, HTN, DM-2 who presented with worsening shortness of breath-was found to have acute on chronic hypoxic/hypercarbic respiratory failure-he was placed on BiPAP and admitted to the hospitalist service.  See below for further details.  Significant events: 4/11>> [facility] acute on chronic hypercarbic/hypoxic respiratory failure requiring BiPAP.  Significant studies: 4/11>> chest x-ray: No obvious pneumonia-pulmonary vascular congestion 4/14>> lower extremity Doppler: No DVT 4/14>> Echo: Pending  Antimicrobial therapy: Rocephin: 4/11 x 1 Zithromax: 4/11>> 4/14  Microbiology data: 4/11>> Covid PCR: Negative  Procedures : None  Consults: PCCM  DVT Prophylaxis : Lovenox prophylactically   Subjective: Feels better-inquiring about discharge-tolerated BiPAP well last night.   Assessment/Plan: Acute on chronic hypercarbic/hypoxic respiratory failure: Due to OHS/OSA exacerbation.  Much improved after positive pressure ventilation-when sleeping at night.  Case management following for trilogy machine on discharge-request that we do a overnight pulse oximetry in case patient needs BiPAP on discharge before trilogy machine can be arranged.  [facility]with [facility] for outpatient [facility]-up.  COPD exacerbation: Improved-continue bronchodilators-no longer on steroids.  Stopped Zithromax today.  Chronic diastolic heart failure: Has lymphedema at baseline-tolerating IV Lasix-await echocardiogram.  Dopplers negative for DVT.  Plan is to keep in negative balance-as long as  electrolytes are stable.  Normocytic anemia: Mild-stable for outpatient  HTN: BP stable-continue Coreg/losartan.  HLD: Continue statin  DM-2 (A1c 7.7 on 4/12): CBG stable-continue SSI-resume oral hypoglycemic agents on discharge.  CBG (last 3)  Recent Labs    04/02/21 1728 04/02/21 1944 04/03/21 1250  GLUCAP 117* 117* 113*   Presumed OSA/OHS: Plans are for trilogy machine on discharge-if not-may require BiPAP.  Case management requests that we do a overnight pulse oximetry.  Morbid Obesity: Estimated body mass index is 56.79 kg/m as calculated from the following:   Height as of this encounter: 5\' 11"  (1.803 m).   Weight as of this encounter: 184.7 kg.    Diet: Diet Order            Diet Heart Room service appropriate? Yes; Fluid consistency: Thin  Diet effective now                  Code Status: Full code   Family Communication: None at bedside   Disposition Plan: Status is: Inpatient  Remains inpatient appropriate because:Inpatient level of care appropriate due to severity of illness   Dispo: The patient is from: Home              Anticipated d/c is to: Home              Patient currently is not medically stable to d/c.   Difficult to place patient No   Barriers to Discharge: Acute on chronic hypoxic/hypercarbic respiratory failure requiring BiPAP-on IV diuretics-awaiting overnight pulse oximetry  Antimicrobial agents: Anti-infectives (From admission, onward)   Start     Dose/Rate Route Frequency Ordered Stop   04/01/21 2000  azithromycin (ZITHROMAX) 500 mg in sodium chloride 0.9 % 250 mL IVPB  Status:  Discontinued        500 mg 250 mL/hr over 60 Minutes Intravenous Every 24 hours 04/01/21 0537 04/01/21 0841   04/01/21 1000  azithromycin (ZITHROMAX) tablet 500 mg        500 mg Oral Daily 04/01/21 0841     03/31/21 1945  azithromycin (ZITHROMAX) 500 mg in sodium chloride 0.9 % 250 mL IVPB        500 mg 250 mL/hr over 60 Minutes Intravenous  Once  03/31/21 1931 03/31/21 2259   03/31/21 1945  cefTRIAXone (ROCEPHIN) 1 g in sodium chloride 0.9 % 100 mL IVPB        1 g 200 mL/hr over 30 Minutes Intravenous  Once 03/31/21 1931 03/31/21 2054       Time spent: 25 minutes-Greater than 50% of this time was spent in counseling, explanation of diagnosis, planning of further management, and coordination of care.  MEDICATIONS: Scheduled Meds: . aspirin EC  81 mg Oral Daily  . atorvastatin  10 mg Oral Daily  . azithromycin  500 mg Oral Daily  . carvedilol  25 mg Oral BID WC  . enoxaparin (LOVENOX) injection  90 mg Subcutaneous Q24H  . ferrous sulfate  325 mg Oral Q breakfast  . folic acid  1 mg Oral Daily  . furosemide  40 mg Intravenous BID  . insulin aspart  0-20 Units Subcutaneous TID WC  . insulin aspart  0-5 Units Subcutaneous QHS  . losartan  25 mg Oral Daily  . vitamin B-12  1,000 mcg Oral Daily   Continuous Infusions: PRN Meds:.acetaminophen **OR** acetaminophen, levalbuterol, perflutren lipid microspheres (DEFINITY) IV suspension   PHYSICAL EXAM: Vital signs: Vitals:   04/03/21 0424 04/03/21 0431 04/03/21 0817 04/03/21 1248  BP:   (!) 96/55 (!) 94/47  Pulse:  (!) 56 (!) 57 60  Resp:  15 14 19   Temp:   98.3 F (36.8 C) 98.1 F (36.7 C)  TempSrc:   Axillary Oral  SpO2:  100% 98% 95%  Weight: (!) 184.7 kg     Height:       Filed Weights   03/31/21 1705 04/02/21 0440 04/03/21 0424  Weight: (!) 183.7 kg (!) 184.6 kg (!) 184.7 kg   Body mass index is 56.79 kg/m.   Gen Exam:Alert awake-not in any distress HEENT:atraumatic, normocephalic Chest: B/L clear to auscultation anteriorly CVS:S1S2 regular Abdomen:soft non tender, non distended Extremities:+++ edema-probable lymphedema at baseline Neurology: Non focal Skin: no rash  I have personally reviewed following labs and imaging studies  LABORATORY DATA: CBC: Recent Labs  Lab 03/31/21 1722 04/01/21 0629 04/01/21 1130  WBC 6.6 6.6  --   NEUTROABS 4.4  --    --   HGB 10.4* 11.1* 11.9*  HCT 35.3* 38.9* 35.0*  MCV 80.2 82.4  --   PLT 214 214  --     Basic Metabolic Panel: Recent Labs  Lab 03/31/21 1722 04/01/21 0629 04/01/21 1130 04/02/21 0248 04/03/21 0102  NA 140 141 141 141 140  K 4.1 4.4 4.2 4.6 3.7  CL 94* 93*  --  92* 89*  CO2 41* 41*  --  40* 47*  GLUCOSE 154* 193*  --  162* 110*  BUN 11 10  --  21* 23*  CREATININE 0.97 0.91  --  1.18 1.19  CALCIUM 8.9 8.9  --  8.7* 8.5*  MG  --  2.1  --   --   --   PHOS  --  4.8*  --   --   --  GFR: Estimated Creatinine Clearance: 120.9 mL/min (by C-G formula based on SCr of 1.19 mg/dL).  Liver Function Tests: Recent Labs  Lab 03/31/21 1722 04/01/21 0629  AST 29 19  ALT 32 32  ALKPHOS 89 93  BILITOT 0.8 0.4  PROT 7.3 7.8  ALBUMIN 2.9* 3.2*   Recent Labs  Lab 03/31/21 1722  LIPASE 26   No results for input(s): AMMONIA in the last 168 hours.  Coagulation Profile: No results for input(s): INR, PROTIME in the last 168 hours.  Cardiac Enzymes: No results for input(s): CKTOTAL, CKMB, CKMBINDEX, TROPONINI in the last 168 hours.  BNP (last 3 results) No results for input(s): PROBNP in the last 8760 hours.  Lipid Profile: No results for input(s): CHOL, HDL, LDLCALC, TRIG, CHOLHDL, LDLDIRECT in the last 72 hours.  Thyroid Function Tests: Recent Labs    04/01/21 0426  TSH 0.596    Anemia Panel: Recent Labs    03/31/21 2119  VITAMINB12 244  FOLATE 5.5*  FERRITIN 86  TIBC 316  IRON 49  RETICCTPCT 1.9    Urine analysis: No results found for: COLORURINE, APPEARANCEUR, LABSPEC, PHURINE, GLUCOSEU, HGBUR, BILIRUBINUR, KETONESUR, PROTEINUR, UROBILINOGEN, NITRITE, LEUKOCYTESUR  Sepsis Labs: Lactic Acid, Venous No results found for: LATICACIDVEN  MICROBIOLOGY: Recent Results (from the past 240 hour(s))  SARS CORONAVIRUS 2 (TAT 6-24 HRS) Nasopharyngeal Nasopharyngeal Swab     Status: None   Collection Time: 03/31/21  5:22 PM   Specimen: Nasopharyngeal Swab   Result Value Ref Range Status   SARS Coronavirus 2 NEGATIVE NEGATIVE Final    Comment: (NOTE) SARS-CoV-2 target nucleic acids are NOT DETECTED.  The SARS-CoV-2 RNA is generally detectable in upper and lower respiratory specimens during the acute phase of infection. Negative results do not preclude SARS-CoV-2 infection, do not rule out co-infections with other pathogens, and should not be used as the sole basis for treatment or other patient management decisions. Negative results must be combined with clinical observations, patient history, and epidemiological information. The expected result is Negative.  Fact Sheet for Patients: HairSlick.no  Fact Sheet for Healthcare Providers: quierodirigir.com  This test is not yet approved or cleared by the [place] FDA and  has been authorized for detection and/or diagnosis of SARS-CoV-2 by FDA under an Emergency Use Authorization (EUA). This EUA will remain  in effect (meaning this test can be used) for the duration of the COVID-19 declaration under Se ction 564(b)(1) of the Act, 21 U.S.C. section 360bbb-3(b)(1), unless the authorization is terminated or revoked sooner.  Performed at [facility], [address], [place], [place] [address]     RADIOLOGY STUDIES/RESULTS: VAS [place] LOWER EXTREMITY VENOUS (DVT)  Result Date: 04/03/2021  Lower Venous DVT Study Indications: Swelling, and Edema.  Limitations: Body habitus and poor ultrasound/tissue interface. Comparison Study: no prior Performing Technologist: Blanch Media RVS  Examination Guidelines: A complete evaluation includes B-mode imaging, spectral Doppler, color Doppler, and power Doppler as needed of all accessible portions of each vessel. Bilateral testing is considered an integral part of a complete examination. Limited examinations for reoccurring indications may be performed as noted. The reflux portion of the exam is  performed with the patient in reverse Trendelenburg.  +---------+---------------+---------+-----------+----------+-------------------+ RIGHT    CompressibilityPhasicitySpontaneityPropertiesThrombus Aging      +---------+---------------+---------+-----------+----------+-------------------+ CFV      Full           Yes      Yes                                      +---------+---------------+---------+-----------+----------+-------------------+  SFJ      Full                                                             +---------+---------------+---------+-----------+----------+-------------------+ FV Prox  Full                                                             +---------+---------------+---------+-----------+----------+-------------------+ FV Mid                  Yes      Yes                                      +---------+---------------+---------+-----------+----------+-------------------+ FV Distal               Yes      Yes                                      +---------+---------------+---------+-----------+----------+-------------------+ PFV      Full                                                             +---------+---------------+---------+-----------+----------+-------------------+ POP      Full           Yes      Yes                                      +---------+---------------+---------+-----------+----------+-------------------+ PTV                                                   Not well visualized +---------+---------------+---------+-----------+----------+-------------------+ PERO                                                  Not well visualized +---------+---------------+---------+-----------+----------+-------------------+   +---------+---------------+---------+-----------+----------+-------------------+ LEFT     CompressibilityPhasicitySpontaneityPropertiesThrombus Aging       +---------+---------------+---------+-----------+----------+-------------------+ CFV      Full           Yes      Yes                                      +---------+---------------+---------+-----------+----------+-------------------+ SFJ      Full                                                             +---------+---------------+---------+-----------+----------+-------------------+  FV Prox  Full                                                             +---------+---------------+---------+-----------+----------+-------------------+ FV Mid                  Yes      Yes                                      +---------+---------------+---------+-----------+----------+-------------------+ FV Distal               Yes      Yes                                      +---------+---------------+---------+-----------+----------+-------------------+ PFV      Full                                                             +---------+---------------+---------+-----------+----------+-------------------+ POP      Full           Yes      Yes                                      +---------+---------------+---------+-----------+----------+-------------------+ PTV                                                   Not well visualized +---------+---------------+---------+-----------+----------+-------------------+ PERO                                                  Not well visualized +---------+---------------+---------+-----------+----------+-------------------+     Summary: RIGHT: - There is no evidence of deep vein thrombosis in the lower extremity. However, portions of this examination were limited- see technologist comments above.  - No cystic structure found in the popliteal fossa.  LEFT: - There is no evidence of deep vein thrombosis in the lower extremity. However, portions of this examination were limited- see technologist comments above.  - No cystic  structure found in the popliteal fossa.  *See table(s) above for measurements and observations.    Preliminary      LOS: 3 days   Jeoffrey Massed, MD  [facility]    To contact the attending provider between 7A-7P or the covering provider during after hours 7P-7A, please log into the web site [email or web address] and access using universal [place] password for that web site. If you do not have the password, please call the hospital operator.  04/03/2021, 1:44 PM

## 2021-04-03 NOTE — TOC Progression Note (Signed)
Transition of Care [facility]) - Progression Note    Patient Details  Name: Edwin Mcclain MRN: [id number] Date of Birth: 08-23-1966  [facility] CM/SW Contact  Huston Foley Jacklynn Ganong, RN Phone Number: 04/03/2021, 11:12 AM  Clinical Narrative:  Case manager following up on Trilogy for patient. Per Jenness Corner with [employer], we are still waiting for [facility] to authorize. MD requested to order overnight oximetry for patient. Zack will update CM and MD when Berkley Harvey is received. TOC Team will continue to monitor.         Expected Discharge Plan and Services                                                 Social Determinants of Health (SDOH) Interventions    Readmission Risk Interventions No flowsheet data found.

## 2021-04-03 NOTE — Therapy (Signed)
Pt placed on overnight study

## 2021-04-03 NOTE — Progress Notes (Signed)
   04/03/21 1800  Clinical Encounter Type  Visited With Patient  Visit Type Spiritual support  Referral From Nurse  Consult/Referral To Chaplain  Spiritual Encounters  Spiritual Needs Grief support;Emotional;Prayer  Stress Factors  Patient Stress Factors Loss;Exhausted  Chaplain visited with Mr. Uselman, he had much to talk about. He shared his mother passed last month and he is going through depression. We spoke on family dynamics and where he wants to go from here.  Chaplain offered prayer, emotional and spiritual support. It was a powerful visit and Mr. Towner was grateful for the time given.    Chaplain Kal Chait Mrgan-Simpson [phone]

## 2021-04-03 NOTE — Progress Notes (Signed)
Lower extremity venous has been completed.   Preliminary results in CV Proc.   Blanch Media 04/03/2021 9:37 AM

## 2021-04-03 NOTE — Progress Notes (Incomplete)
Echocardiogram completed.

## 2021-04-04 LAB — BASIC METABOLIC PANEL
Anion gap: 4 — ABNORMAL LOW (ref 5–15)
BUN: 20 mg/dL (ref 6–20)
CO2: 42 mmol/L — ABNORMAL HIGH (ref 22–32)
Calcium: 8.5 mg/dL — ABNORMAL LOW (ref 8.9–10.3)
Chloride: 90 mmol/L — ABNORMAL LOW (ref 98–111)
Creatinine, Ser: 1.03 mg/dL (ref 0.61–1.24)
GFR, Estimated: >60 mL/min (ref >60)
Glucose, Bld: 124 mg/dL — ABNORMAL HIGH (ref 70–99)
Potassium: 3.1 mmol/L — ABNORMAL LOW (ref 3.5–5.1)
Sodium: 136 mmol/L (ref 135–145)

## 2021-04-04 LAB — BLOOD GAS, ARTERIAL
Acid-Base Excess: 16.7 mmol/L — ABNORMAL HIGH (ref 0.0–2.0)
Bicarbonate: 43 mmol/L — ABNORMAL HIGH (ref 20.0–28.0)
Drawn by: 60057
FIO2: 40
O2 Saturation: 98.1 %
Patient temperature: 37
pCO2 arterial: 77.9 mmHg (ref 32.0–48.0)
pH, Arterial: 7.361 (ref 7.350–7.450)
pO2, Arterial: 113 mmHg — ABNORMAL HIGH (ref 83.0–108.0)

## 2021-04-04 LAB — GLUCOSE, CAPILLARY
Glucose-Capillary: 101 mg/dL — ABNORMAL HIGH (ref 70–99)
Glucose-Capillary: 139 mg/dL — ABNORMAL HIGH (ref 70–99)

## 2021-04-04 LAB — TROPONIN I (HIGH SENSITIVITY): Troponin I (High Sensitivity): 9 ng/L (ref <18)

## 2021-04-04 MED ORDER — POTASSIUM CHLORIDE CRYS ER 20 MEQ PO TBCR
40.0000 meq | EXTENDED_RELEASE_TABLET | Freq: Once | ORAL | Status: AC
  Administered 2021-04-04: 40 meq via ORAL
  Filled 2021-04-04: qty 2

## 2021-04-04 MED ORDER — CYANOCOBALAMIN 1000 MCG PO TABS
1000.0000 ug | ORAL_TABLET | Freq: Every day | ORAL | 0 refills | Status: AC
Start: 2021-04-05 — End: ?

## 2021-04-04 NOTE — Discharge Summary (Addendum)
PATIENT DETAILS Name: Edwin Mcclain Age: 55 y.o. Sex: male Date of Birth: [birth year] MRN: [id number]. Admitting Physician: Angie Fava, DO PTW:SFKCLEX, Kandee Keen, DO  Admit Date: 03/31/2021 Discharge date: 04/04/2021  Recommendations for Outpatient Follow-up:  1. Follow up with PCP in 1-2 weeks 2. Please obtain CMP/CBC in one week 3. Please ensure follow-up with pulmonology  Admitted From:  Home  Disposition: Home   Home Health: No  Equipment/Devices: None  Discharge Condition: Stable  CODE STATUS: FULL CODE  Diet recommendation:  Diet Order            Diet - low sodium heart healthy           Diet Carb Modified           Diet Heart Room service appropriate? Yes; Fluid consistency: Thin  Diet effective now                  Brief Narrative: Patient is a 55 y.o. male with history of COPD, probable OSA/OHS, chronic hypoxic respiratory failure on 3 L of oxygen, chronic diastolic heart failure, lymphedema, HTN, DM-2 who presented with worsening shortness of breath-was found to have acute on chronic hypoxic/hypercarbic respiratory failure-he was placed on BiPAP and admitted to the hospitalist service.  See below for further details.  Significant events: 4/11>> admit to [facility] for acute on chronic hypercarbic/hypoxic respiratory failure requiring BiPAP.  Significant studies: 4/11>> chest x-ray: No obvious pneumonia-pulmonary vascular congestion 4/14>> lower extremity Doppler: No DVT 4/14>> Echo: EF 55-60%  Antimicrobial therapy: Rocephin: 4/11 x 1 Zithromax: 4/11>> 4/14  Microbiology data: 4/11>> Covid PCR: Negative  Procedures : None  Consults: PCCM  Brief Hospital Course: Acute on chronic hypercarbic/hypoxic respiratory failure: Due to OHS/OSA exacerbation.  Much improved after positive pressure ventilation-when sleeping at night.    Case management consulted-subsequently trilogy machine has been arranged for patient on discharge.   Evaluated by PCCM this hospitalization-plans of outpatient follow-up.  COPD exacerbation:  Improve-continue bronchodilators as previous.  Chronic diastolic heart failure: Has lymphedema at baseline-maintain on IV Lasix-lower extremity Dopplers negative for DVT.  Resume diuretic regimen on discharge.   Normocytic anemia: Mild-stable for outpatient  HTN: BP stable-continue Coreg/losartan.  HLD: Continue statin  DM-2 (A1c 7.7 on 4/12): CBG stable-continue SSI-resume oral hypoglycemic agents on discharge.  Presumed OSA/OHS:  See above  Morbid Obesity: Estimated body mass index is 56.79 kg/m as calculated from the following:   Height as of this encounter: 5\' 11"  (1.803 m).   Weight as of this encounter: 184.7 kg.    Discharge Diagnoses:  Principal Problem:   Acute exacerbation of chronic obstructive pulmonary disease (COPD) (HCC) Active Problems:   HTN (hypertension)   Type 2 diabetes mellitus with complication, without long-term current use of insulin (HCC)   Acute on chronic respiratory failure with hypoxia (HCC)   SOB (shortness of breath)   Anemia   Chronic diastolic CHF (congestive heart failure) (HCC)   Discharge Instructions:  Activity:  As tolerated   Discharge Instructions    Call MD for:  difficulty breathing, headache or visual disturbances   Complete by: As directed    Diet - low sodium heart healthy   Complete by: As directed    Diet Carb Modified   Complete by: As directed    Discharge instructions   Complete by: As directed    Follow with Primary MD  , DO in 1-2 weeks  Follow with Dr. Arvilla Market 5/23 at 9:15 AM  Please  get a complete blood count and chemistry panel checked by your Primary MD at your next visit, and again as instructed by your Primary MD.  Get Medicines reviewed and adjusted: Please take all your medications with you for your next visit with your Primary MD  Laboratory/radiological  data: Please request your Primary MD to go over all hospital tests and procedure/radiological results at the follow up, please ask your Primary MD to get all Hospital records sent to his/her office.  In some cases, they will be blood work, cultures and biopsy results pending at the time of your discharge. Please request that your primary care M.D. follows up on these results.  Also Note the following: If you experience worsening of your admission symptoms, develop shortness of breath, life threatening emergency, suicidal or homicidal thoughts you must seek medical attention immediately by calling 911 or calling your MD immediately  if symptoms less severe.  You must read complete instructions/literature along with all the possible adverse reactions/side effects for all the Medicines you take and that have been prescribed to you. Take any new Medicines after you have completely understood and accpet all the possible adverse reactions/side effects.   Do not drive when taking Pain medications or sleeping medications (Benzodaizepines)  Do not take more than prescribed Pain, Sleep and Anxiety Medications. It is not advisable to combine anxiety,sleep and pain medications without talking with your primary care practitioner  Special Instructions: If you have smoked or chewed Tobacco  in the last 2 yrs please stop smoking, stop any regular Alcohol  and or any Recreational drug use.  Wear Seat belts while driving.  Please note: You were cared for by a hospitalist during your hospital stay. Once you are discharged, your primary care physician will handle any further medical issues. Please note that NO REFILLS for any discharge medications will be authorized once you are discharged, as it is imperative that you return to your primary care physician (or establish a relationship with a primary care physician if you do not have one) for your post hospital discharge needs so that they can reassess your need for  medications and monitor your lab values.   Increase activity slowly   Complete by: As directed      Allergies as of 04/04/2021   No Known Allergies     Medication List    STOP taking these medications   meloxicam 7.5 MG tablet Commonly known as: MOBIC     TAKE these medications   aspirin 81 MG EC tablet Take 1 tablet (81 mg total) by mouth daily.   atorvastatin 10 MG tablet Commonly known as: LIPITOR TAKE 1 TABLET(10 MG) BY MOUTH DAILY What changed: See the new instructions.   carvedilol 25 MG tablet Commonly known as: COREG Take 1 tablet (25 mg total) by mouth 2 (two) times daily with a meal.   cyanocobalamin 1000 MCG tablet Take 1 tablet (1,000 mcg total) by mouth daily. Start taking on: April 05, 2021   iron polysaccharides 150 MG capsule Commonly known as: NIFEREX Take 150 mg by mouth daily.   losartan 25 MG tablet Commonly known as: COZAAR TAKE 1 TABLET(25 MG) BY MOUTH DAILY What changed:   how much to take  how to take this  when to take this  additional instructions   Melatonin 10 MG Tabs Take 1 tablet by mouth at bedtime as needed (sleep).   ONE TOUCH DELICA LANCING DEV Misc Use as directed. Dx: E10.9, E11.9   OneTouch Delica  Lancets 30G Misc 1 each by Does not apply route daily. Dx: E10.9, E11.9   sitaGLIPtin 25 MG tablet Commonly known as: JANUVIA Take 1 tablet (25 mg total) by mouth daily.   torsemide 20 MG tablet Commonly known as: DEMADEX TAKE 1 TABLET BY MOUTH TWICE DAILY What changed: when to take this       Follow-up Information    Arvilla Market, DO. Schedule an appointment as soon as possible for a visit in 1 week(s).   Specialty: Family Medicine Contact information: [address] [place] [address] [phone]        Rollene Rotunda, MD. Schedule an appointment as soon as possible for a visit in 1 month(s).   Specialty: Cardiology Contact information: [address] [place]  [address] [phone]        Coralyn Helling, MD Follow up on 05/12/2021.   Specialty: Pulmonary Disease Why: appt at 9:15 am Contact information: [address] [place] [address] [phone]              No Known Allergies  Other Procedures/Studies: DG Chest Portable 1 View  Result Date: 03/31/2021 CLINICAL DATA:  Chest pain, short of breath EXAM: PORTABLE CHEST 1 VIEW COMPARISON:  02/07/2019 FINDINGS: Single frontal view of the chest demonstrates chronic enlargement the cardiac silhouette. There is central vascular congestion without airspace disease, effusion, or pneumothorax. No acute bony abnormalities. IMPRESSION: 1. Chronic enlargement the cardiac silhouette. 2. Pulmonary vascular congestion without overt edema. Electronically Signed   By: Sharlet Salina M.D.   On: 03/31/2021 17:59   ECHOCARDIOGRAM COMPLETE  Result Date: 04/03/2021    ECHOCARDIOGRAM REPORT   Patient Name:   [facility] [place] Date of Exam: 04/03/2021 Medical Rec #:  [id number]    Height:       71.0 in Accession #:    [id number]   Weight:       407.2 lb Date of Birth:  [birth year]    BSA:          2.852 m Patient Age:    54 years     BP:           120/75 mmHg Patient Gender: M            HR:           59 bpm. Exam Location:  Inpatient Procedure: 2D Echo, Cardiac Doppler, Color Doppler and Intracardiac            Opacification Agent Indications:    Acute respiratory distress  History:        Patient has prior history of Echocardiogram examinations, most                 recent 02/08/2019. CHF, COPD; Risk Factors:Hypertension.  Sonographer:    Neomia Dear RDCS Referring Phys: 84 [facility]  Sonographer Comments: Suboptimal apical window, suboptimal subcostal window, Technically difficult study due to poor echo windows, patient is morbidly obese and suboptimal parasternal window. IMPRESSIONS  1. Left ventricular ejection fraction, by estimation, is 55 to 60%. The left ventricle has normal function. The left  ventricle has no regional wall motion abnormalities. Left ventricular diastolic parameters were normal.  2. Right ventricular systolic function was not well visualized. The right ventricular size is not well visualized. There is normal pulmonary artery systolic pressure.  3. The mitral valve is normal in structure. No evidence of mitral valve regurgitation. No evidence of mitral stenosis.  4. The aortic valve  was not well visualized. Aortic valve regurgitation is not visualized. No aortic stenosis is present.  5. Aortic dilatation noted. There is mild dilatation of the aortic root, measuring 40 mm.  6. No complete TR doppler jet so unable to estimate PA systolic pressure. FINDINGS  Left Ventricle: Left ventricular ejection fraction, by estimation, is 55 to 60%. The left ventricle has normal function. The left ventricle has no regional wall motion abnormalities. The left ventricular internal cavity size was normal in size. There is  no left ventricular hypertrophy. Left ventricular diastolic parameters were normal. Right Ventricle: The right ventricular size is not well visualized. Right vetricular wall thickness was not well visualized. Right ventricular systolic function was not well visualized. There is normal pulmonary artery systolic pressure. The tricuspid regurgitant velocity is 2.58 m/s, and with an assumed right atrial pressure of 3 mmHg, the estimated right ventricular systolic pressure is 29.6 mmHg. Left Atrium: Left atrial size was normal in size. Right Atrium: Right atrial size was not well visualized. Pericardium: Trivial pericardial effusion is present. Mitral Valve: The mitral valve is normal in structure. Mild mitral annular calcification. No evidence of mitral valve regurgitation. No evidence of mitral valve stenosis. Tricuspid Valve: The tricuspid valve is normal in structure. Tricuspid valve regurgitation is trivial. Aortic Valve: The aortic valve was not well visualized. Aortic valve  regurgitation is not visualized. No aortic stenosis is present. Aortic valve mean gradient measures 8.0 mmHg. Aortic valve peak gradient measures 13.1 mmHg. Aortic valve area, by VTI measures 4.97 cm. Pulmonic Valve: The pulmonic valve was not well visualized. Pulmonic valve regurgitation is not visualized. Aorta: Aortic dilatation noted. There is mild dilatation of the aortic root, measuring 40 mm. IAS/Shunts: The interatrial septum was not well visualized.  LEFT VENTRICLE PLAX 2D LVIDd:         5.70 cm  Diastology LVIDs:         3.10 cm  LV e' medial:    8.49 cm/s LV PW:         1.10 cm  LV E/e' medial:  13.8 LV IVS:        0.90 cm  LV e' lateral:   9.36 cm/s LVOT diam:     3.10 cm  LV E/e' lateral: 12.5 LV SV:         196 LV SV Index:   69 LVOT Area:     7.55 cm  RIGHT VENTRICLE RV S prime:     17.30 cm/s LEFT ATRIUM             Index LA diam:        5.00 cm 1.75 cm/m LA Vol (A2C):   39.0 ml 13.67 ml/m LA Vol (A4C):   35.7 ml 12.52 ml/m LA Biplane Vol: 37.7 ml 13.22 ml/m  AORTIC VALVE AV Area (Vmax):    4.80 cm AV Area (Vmean):   4.77 cm AV Area (VTI):     4.97 cm AV Vmax:           181.00 cm/s AV Vmean:          129.000 cm/s AV VTI:            0.395 m AV Peak Grad:      13.1 mmHg AV Mean Grad:      8.0 mmHg LVOT Vmax:         115.00 cm/s LVOT Vmean:        81.500 cm/s LVOT VTI:  0.260 m LVOT/AV VTI ratio: 0.66  AORTA Ao Root diam: 4.00 cm MITRAL VALVE                TRICUSPID VALVE MV Area (PHT): 3.48 cm     TR Peak grad:   26.6 mmHg MV Decel Time: 218 msec     TR Vmax:        258.00 cm/s MV E velocity: 117.00 cm/s MV A velocity: 62.40 cm/s   SHUNTS MV E/A ratio:  1.87         Systemic VTI:  0.26 m                             Systemic Diam: 3.10 cm Marca Ancona MD Electronically signed by Marca Ancona MD Signature Date/Time: 04/03/2021/5:46:10 PM    Final    VAS [place] LOWER EXTREMITY VENOUS (DVT)  Result Date: 04/03/2021  Lower Venous DVT Study Indications: Swelling, and Edema.  Limitations:  Body habitus and poor ultrasound/tissue interface. Comparison Study: no prior Performing Technologist: Blanch Media RVS  Examination Guidelines: A complete evaluation includes B-mode imaging, spectral Doppler, color Doppler, and power Doppler as needed of all accessible portions of each vessel. Bilateral testing is considered an integral part of a complete examination. Limited examinations for reoccurring indications may be performed as noted. The reflux portion of the exam is performed with the patient in reverse Trendelenburg.  +---------+---------------+---------+-----------+----------+-------------------+ RIGHT    CompressibilityPhasicitySpontaneityPropertiesThrombus Aging      +---------+---------------+---------+-----------+----------+-------------------+ CFV      Full           Yes      Yes                                      +---------+---------------+---------+-----------+----------+-------------------+ SFJ      Full                                                             +---------+---------------+---------+-----------+----------+-------------------+ FV Prox  Full                                                             +---------+---------------+---------+-----------+----------+-------------------+ FV Mid                  Yes      Yes                                      +---------+---------------+---------+-----------+----------+-------------------+ FV Distal               Yes      Yes                                      +---------+---------------+---------+-----------+----------+-------------------+ PFV      Full                                                             +---------+---------------+---------+-----------+----------+-------------------+  POP      Full           Yes      Yes                                      +---------+---------------+---------+-----------+----------+-------------------+ PTV                                                    Not well visualized +---------+---------------+---------+-----------+----------+-------------------+ PERO                                                  Not well visualized +---------+---------------+---------+-----------+----------+-------------------+   +---------+---------------+---------+-----------+----------+-------------------+ LEFT     CompressibilityPhasicitySpontaneityPropertiesThrombus Aging      +---------+---------------+---------+-----------+----------+-------------------+ CFV      Full           Yes      Yes                                      +---------+---------------+---------+-----------+----------+-------------------+ SFJ      Full                                                             +---------+---------------+---------+-----------+----------+-------------------+ FV Prox  Full                                                             +---------+---------------+---------+-----------+----------+-------------------+ FV Mid                  Yes      Yes                                      +---------+---------------+---------+-----------+----------+-------------------+ FV Distal               Yes      Yes                                      +---------+---------------+---------+-----------+----------+-------------------+ PFV      Full                                                             +---------+---------------+---------+-----------+----------+-------------------+ POP      Full           Yes      Yes                                      +---------+---------------+---------+-----------+----------+-------------------+  PTV                                                   Not well visualized +---------+---------------+---------+-----------+----------+-------------------+ PERO                                                  Not well visualized  +---------+---------------+---------+-----------+----------+-------------------+     Summary: RIGHT: - There is no evidence of deep vein thrombosis in the lower extremity. However, portions of this examination were limited- see technologist comments above.  - No cystic structure found in the popliteal fossa.  LEFT: - There is no evidence of deep vein thrombosis in the lower extremity. However, portions of this examination were limited- see technologist comments above.  - No cystic structure found in the popliteal fossa.  *See table(s) above for measurements and observations. Electronically signed by Heath Lark on 04/03/2021 at 5:17:57 PM.    Final      TODAY-DAY OF DISCHARGE:  Subjective:   Climmie Dwiggins today has no headache,no chest abdominal pain,no new weakness tingling or numbness, feels much better wants to go home today.   Objective:   Blood pressure (!) 102/92, pulse 63, temperature 98.9 F (37.2 C), temperature source Axillary, resp. rate 15, height  (1.803 m), weight (!) 184.7 kg, SpO2 95 %.  Intake/Output Summary (Last 24 hours) at 04/04/2021 1045 Last data filed at 04/03/2021 1940 Gross per 24 hour  Intake 240 ml  Output 450 ml  Net -210 ml   Filed Weights   03/31/21 1705 04/02/21 0440 04/03/21 0424  Weight: (!) 183.7 kg (!) 184.6 kg (!) 184.7 kg    Exam: Awake Alert, Oriented *3, No new F.N deficits, Normal affect Peck.AT,PERRAL Supple Neck,No JVD, No cervical lymphadenopathy appriciated.  Symmetrical Chest wall movement, Good air movement bilaterally, CTAB RRR,No Gallops,Rubs or new Murmurs, No Parasternal Heave +ve B.Sounds, Abd Soft, Non tender, No organomegaly appriciated, No rebound -guarding or rigidity. No Cyanosis, Clubbing or edema, No new Rash or bruise   PERTINENT RADIOLOGIC STUDIES: ECHOCARDIOGRAM COMPLETE  Result Date: 04/03/2021    ECHOCARDIOGRAM REPORT   Patient Name:   [facility] [place] Date of Exam: 04/03/2021 Medical Rec #:  [id number]    Height:        71.0 in Accession #:    [id number]   Weight:       407.2 lb Date of Birth:  May 13, 1966    BSA:          2.852 m Patient Age:    54 years     BP:           120/75 mmHg Patient Gender: M            HR:           59 bpm. Exam Location:  Inpatient Procedure: 2D Echo, Cardiac Doppler, Color Doppler and Intracardiac            Opacification Agent Indications:    Acute respiratory distress  History:        Patient has prior history of Echocardiogram examinations, most                 recent 02/08/2019. CHF, COPD; Risk Factors:Hypertension.  Sonographer:    Neomia Dear RDCS Referring Phys: 65 [facility] M [facility]  Sonographer Comments: Suboptimal apical window, suboptimal subcostal window, Technically difficult study due to poor echo windows, patient is morbidly obese and suboptimal parasternal window. IMPRESSIONS  1. Left ventricular ejection fraction, by estimation, is 55 to 60%. The left ventricle has normal function. The left ventricle has no regional wall motion abnormalities. Left ventricular diastolic parameters were normal.  2. Right ventricular systolic function was not well visualized. The right ventricular size is not well visualized. There is normal pulmonary artery systolic pressure.  3. The mitral valve is normal in structure. No evidence of mitral valve regurgitation. No evidence of mitral stenosis.  4. The aortic valve was not well visualized. Aortic valve regurgitation is not visualized. No aortic stenosis is present.  5. Aortic dilatation noted. There is mild dilatation of the aortic root, measuring 40 mm.  6. No complete TR doppler jet so unable to estimate PA systolic pressure. FINDINGS  Left Ventricle: Left ventricular ejection fraction, by estimation, is 55 to 60%. The left ventricle has normal function. The left ventricle has no regional wall motion abnormalities. The left ventricular internal cavity size was normal in size. There is  no left ventricular hypertrophy. Left ventricular diastolic  parameters were normal. Right Ventricle: The right ventricular size is not well visualized. Right vetricular wall thickness was not well visualized. Right ventricular systolic function was not well visualized. There is normal pulmonary artery systolic pressure. The tricuspid regurgitant velocity is 2.58 m/s, and with an assumed right atrial pressure of 3 mmHg, the estimated right ventricular systolic pressure is 29.6 mmHg. Left Atrium: Left atrial size was normal in size. Right Atrium: Right atrial size was not well visualized. Pericardium: Trivial pericardial effusion is present. Mitral Valve: The mitral valve is normal in structure. Mild mitral annular calcification. No evidence of mitral valve regurgitation. No evidence of mitral valve stenosis. Tricuspid Valve: The tricuspid valve is normal in structure. Tricuspid valve regurgitation is trivial. Aortic Valve: The aortic valve was not well visualized. Aortic valve regurgitation is not visualized. No aortic stenosis is present. Aortic valve mean gradient measures 8.0 mmHg. Aortic valve peak gradient measures 13.1 mmHg. Aortic valve area, by VTI measures 4.97 cm. Pulmonic Valve: The pulmonic valve was not well visualized. Pulmonic valve regurgitation is not visualized. Aorta: Aortic dilatation noted. There is mild dilatation of the aortic root, measuring 40 mm. IAS/Shunts: The interatrial septum was not well visualized.  LEFT VENTRICLE PLAX 2D LVIDd:         5.70 cm  Diastology LVIDs:         3.10 cm  LV e' medial:    8.49 cm/s LV PW:         1.10 cm  LV E/e' medial:  13.8 LV IVS:        0.90 cm  LV e' lateral:   9.36 cm/s LVOT diam:     3.10 cm  LV E/e' lateral: 12.5 LV SV:         196 LV SV Index:   69 LVOT Area:     7.55 cm  RIGHT VENTRICLE RV S prime:     17.30 cm/s LEFT ATRIUM             Index LA diam:        5.00 cm 1.75 cm/m LA Vol (A2C):   39.0 ml 13.67 ml/m LA Vol (A4C):   35.7 ml 12.52 ml/m LA Biplane Vol: 37.7 ml 13.22 ml/m  AORTIC VALVE AV Area  (Vmax):    4.80 cm AV Area (Vmean):   4.77 cm AV Area (VTI):     4.97 cm AV Vmax:           181.00 cm/s AV Vmean:          129.000 cm/s AV VTI:            0.395 m AV Peak Grad:      13.1 mmHg AV Mean Grad:      8.0 mmHg LVOT Vmax:         115.00 cm/s LVOT Vmean:        81.500 cm/s LVOT VTI:          0.260 m LVOT/AV VTI ratio: 0.66  AORTA Ao Root diam: 4.00 cm MITRAL VALVE                TRICUSPID VALVE MV Area (PHT): 3.48 cm     TR Peak grad:   26.6 mmHg MV Decel Time: 218 msec     TR Vmax:        258.00 cm/s MV E velocity: 117.00 cm/s MV A velocity: 62.40 cm/s   SHUNTS MV E/A ratio:  1.87         Systemic VTI:  0.26 m                             Systemic Diam: 3.10 cm Marca Ancona MD Electronically signed by Marca Ancona MD Signature Date/Time: 04/03/2021/5:46:10 PM    Final    VAS [place] LOWER EXTREMITY VENOUS (DVT)  Result Date: 04/03/2021  Lower Venous DVT Study Indications: Swelling, and Edema.  Limitations: Body habitus and poor ultrasound/tissue interface. Comparison Study: no prior Performing Technologist: Blanch Media RVS  Examination Guidelines: A complete evaluation includes B-mode imaging, spectral Doppler, color Doppler, and power Doppler as needed of all accessible portions of each vessel. Bilateral testing is considered an integral part of a complete examination. Limited examinations for reoccurring indications may be performed as noted. The reflux portion of the exam is performed with the patient in reverse Trendelenburg.  +---------+---------------+---------+-----------+----------+-------------------+ RIGHT    CompressibilityPhasicitySpontaneityPropertiesThrombus Aging      +---------+---------------+---------+-----------+----------+-------------------+ CFV      Full           Yes      Yes                                      +---------+---------------+---------+-----------+----------+-------------------+ SFJ      Full                                                              +---------+---------------+---------+-----------+----------+-------------------+ FV Prox  Full                                                             +---------+---------------+---------+-----------+----------+-------------------+ FV Mid                  Yes  Yes                                      +---------+---------------+---------+-----------+----------+-------------------+ FV Distal               Yes      Yes                                      +---------+---------------+---------+-----------+----------+-------------------+ PFV      Full                                                             +---------+---------------+---------+-----------+----------+-------------------+ POP      Full           Yes      Yes                                      +---------+---------------+---------+-----------+----------+-------------------+ PTV                                                   Not well visualized +---------+---------------+---------+-----------+----------+-------------------+ PERO                                                  Not well visualized +---------+---------------+---------+-----------+----------+-------------------+   +---------+---------------+---------+-----------+----------+-------------------+ LEFT     CompressibilityPhasicitySpontaneityPropertiesThrombus Aging      +---------+---------------+---------+-----------+----------+-------------------+ CFV      Full           Yes      Yes                                      +---------+---------------+---------+-----------+----------+-------------------+ SFJ      Full                                                             +---------+---------------+---------+-----------+----------+-------------------+ FV Prox  Full                                                             +---------+---------------+---------+-----------+----------+-------------------+  FV Mid                  Yes      Yes                                      +---------+---------------+---------+-----------+----------+-------------------+  FV Distal               Yes      Yes                                      +---------+---------------+---------+-----------+----------+-------------------+ PFV      Full                                                             +---------+---------------+---------+-----------+----------+-------------------+ POP      Full           Yes      Yes                                      +---------+---------------+---------+-----------+----------+-------------------+ PTV                                                   Not well visualized +---------+---------------+---------+-----------+----------+-------------------+ PERO                                                  Not well visualized +---------+---------------+---------+-----------+----------+-------------------+     Summary: RIGHT: - There is no evidence of deep vein thrombosis in the lower extremity. However, portions of this examination were limited- see technologist comments above.  - No cystic structure found in the popliteal fossa.  LEFT: - There is no evidence of deep vein thrombosis in the lower extremity. However, portions of this examination were limited- see technologist comments above.  - No cystic structure found in the popliteal fossa.  *See table(s) above for measurements and observations. Electronically signed by Heath Larkhomas Hawken on 04/03/2021 at 5:17:57 PM.    Final      PERTINENT LAB RESULTS: CBC: Recent Labs    04/01/21 1130  HGB 11.9*  HCT 35.0*   CMET CMP     Component Value Date/Time   NA 136 04/04/2021 0054   NA 143 10/17/2019 1016   K 3.1 (L) 04/04/2021 0054   CL 90 (L) 04/04/2021 0054   CO2 42 (H) 04/04/2021 0054   GLUCOSE 124 (H) 04/04/2021 0054   BUN 20 04/04/2021 0054   BUN 11 10/17/2019 1016   CREATININE 1.03 04/04/2021  0054   CALCIUM 8.5 (L) 04/04/2021 0054   PROT 7.8 04/01/2021 0629   PROT 6.8 05/29/2019 0953   ALBUMIN 3.2 (L) 04/01/2021 0629   ALBUMIN 4.0 05/29/2019 0953   AST 19 04/01/2021 0629   ALT 32 04/01/2021 0629   ALKPHOS 93 04/01/2021 0629   BILITOT 0.4 04/01/2021 0629   BILITOT 0.3 05/29/2019 0953   GFRNONAA >60 04/04/2021 0054   GFRAA 114 10/17/2019 1016    GFR Estimated Creatinine Clearance: 139.7 mL/min (by C-G formula based on SCr of 1.03 mg/dL). No results for input(s): LIPASE, AMYLASE in the last 72 hours. No results for input(s): CKTOTAL, CKMB, CKMBINDEX,  TROPONINI in the last 72 hours. Invalid input(s): POCBNP No results for input(s): DDIMER in the last 72 hours. No results for input(s): HGBA1C in the last 72 hours. No results for input(s): CHOL, HDL, LDLCALC, TRIG, CHOLHDL, LDLDIRECT in the last 72 hours. No results for input(s): TSH, T4TOTAL, T3FREE, THYROIDAB in the last 72 hours.  Invalid input(s): FREET3 No results for input(s): VITAMINB12, FOLATE, FERRITIN, TIBC, IRON, RETICCTPCT in the last 72 hours. Coags: No results for input(s): INR in the last 72 hours.  Invalid input(s): PT Microbiology: Recent Results (from the past 240 hour(s))  SARS CORONAVIRUS 2 (TAT 6-24 HRS) Nasopharyngeal Nasopharyngeal Swab     Status: None   Collection Time: 03/31/21  5:22 PM   Specimen: Nasopharyngeal Swab  Result Value Ref Range Status   SARS Coronavirus 2 NEGATIVE NEGATIVE Final    Comment: (NOTE) SARS-CoV-2 target nucleic acids are NOT DETECTED.  The SARS-CoV-2 RNA is generally detectable in upper and lower respiratory specimens during the acute phase of infection. Negative results do not preclude SARS-CoV-2 infection, do not rule out co-infections with other pathogens, and should not be used as the sole basis for treatment or other patient management decisions. Negative results must be combined with clinical observations, patient history, and epidemiological information.  The expected result is Negative.  Fact Sheet for Patients: HairSlick.no  Fact Sheet for Healthcare Providers: quierodirigir.com  This test is not yet approved or cleared by the [place] FDA and  has been authorized for detection and/or diagnosis of SARS-CoV-2 by FDA under an Emergency Use Authorization (EUA). This EUA will remain  in effect (meaning this test can be used) for the duration of the COVID-19 declaration under Se ction 564(b)(1) of the Act, 21 U.S.C. section 360bbb-3(b)(1), unless the authorization is terminated or revoked sooner.  Performed at [facility], [address], [place], [place] [address]     FURTHER DISCHARGE INSTRUCTIONS:  Get Medicines reviewed and adjusted: Please take all your medications with you for your next visit with your Primary MD  Laboratory/radiological data: Please request your Primary MD to go over all hospital tests and procedure/radiological results at the follow up, please ask your Primary MD to get all Hospital records sent to his/her office.  In some cases, they will be blood work, cultures and biopsy results pending at the time of your discharge. Please request that your primary care M.D. goes through all the records of your hospital data and follows up on these results.  Also Note the following: If you experience worsening of your admission symptoms, develop shortness of breath, life threatening emergency, suicidal or homicidal thoughts you must seek medical attention immediately by calling 911 or calling your MD immediately  if symptoms less severe.  You must read complete instructions/literature along with all the possible adverse reactions/side effects for all the Medicines you take and that have been prescribed to you. Take any new Medicines after you have completely understood and accpet all the possible adverse reactions/side effects.   Do not drive when taking  Pain medications or sleeping medications (Benzodaizepines)  Do not take more than prescribed Pain, Sleep and Anxiety Medications. It is not advisable to combine anxiety,sleep and pain medications without talking with your primary care practitioner  Special Instructions: If you have smoked or chewed Tobacco  in the last 2 yrs please stop smoking, stop any regular Alcohol  and or any Recreational drug use.  Wear Seat belts while driving.  Please note: You were cared for  by a hospitalist during your hospital stay. Once you are discharged, your primary care physician will handle any further medical issues. Please note that NO REFILLS for any discharge medications will be authorized once you are discharged, as it is imperative that you return to your primary care physician (or establish a relationship with a primary care physician if you do not have one) for your post hospital discharge needs so that they can reassess your need for medications and monitor your lab values.  Total Time spent coordinating discharge including counseling, education and face to face time equals 35 minutes.  SignedJeoffrey Massed 04/04/2021 10:45 AM

## 2021-04-04 NOTE — Plan of Care (Signed)

## 2021-04-04 NOTE — Progress Notes (Signed)
   04/04/21 7915  Provider Notification  Provider Name/Title Dr. Loney Loh  Date Provider Notified 04/04/21  Time Provider Notified [phone]  Notification Type Page  Notification Reason Critical result  Test performed and critical result PCO2 77.9  Date Critical Result Received 04/04/21  Time Critical Result Received 0616  Provider response Other (Comment) (called to ensure pt on bipap)  Date of Provider Response 04/04/21  Time of Provider Response [phone]

## 2021-04-04 NOTE — Therapy (Signed)
Over night pulse ox study stopped prematurely for desaturation into the high 60's per John Giovanni, MD. Pt was placed  on Bipap per phone conversation per MD. See flowsheet.

## 2021-04-04 NOTE — Progress Notes (Signed)
Edwin Mcclain D/C'd home per MD order. D/C instructions discussed with the patient and all questions answered.  An After Visit Summary was printed and given to the patient.  Patient escorted via WC, and D/C home via private auto. Patient belongings and valuables sent with patient. Portable oxygen given to patient. Trilogy will be delivered once patient gets home today.  [name] [name]  04/04/2021 1:59 PM

## 2021-04-04 NOTE — TOC Transition Note (Addendum)
Transition of Care [facility]) - CM/SW Discharge Note   Patient Details  Name: Edwin Mcclain MRN: [id number] Date of Birth: 1966-03-28  Transition of Care [facility] CM/SW Contact:  Epifanio Lesches, RN Phone Number: 04/04/2021, 10:34 AM   Clinical Narrative:    Patient will DC to: home Anticipated DC date: 415/2022 Family notified: yes Transport by: car ( friend/ Circle)  Admitted with acute on chronic respiratory failure. Hx of chronic hypoxic respiratory failure on continuous 3 L/min, COPD, chronic diastolic heart failure, hypertension, type 2 diabetes mellitus. States lives in a rooming house. PTA  Independent with ADL's.  Per MD patient ready for DC today . RN, and patient, and [employer] notified of DC. Per [employer] [phone] ) Trilogy/ NIV machine will be delivered to pt's residence once pt d/c today. Pt active with [employer] for home oxygen. [employer] will deliver portable oxygen tank to bedside for transportation to home prior to d/c, request made with Morrie Sheldon [phone]).  Pt without Rx med concerns or affordability issues.  Post hospital f/u noted on AVS.  RNCM will sign off for now as intervention is no longer needed. Please consult [place] again if new needs arise.    Final next level of care: Home/Self Care Barriers to Discharge: No Barriers Identified   Patient Goals and CMS Choice        Discharge Placement                       Discharge Plan and Services                                     Social Determinants of Health (SDOH) Interventions     Readmission Risk Interventions No flowsheet data found.

## 2021-04-04 NOTE — Progress Notes (Addendum)
Patient c/o not being able to breathe, some chest pains, wanting the oxygen on - on overnight pulse ox study. RT notified and MD notified.  Explained to patient again re: purpose of the study. Patient agreed to keep O2 off until he can no longer "bear it".   Orders received (see orders) from MD. Vitals in Vital Signs tab,

## 2021-04-04 NOTE — Progress Notes (Signed)
CSW received request for Medicaid application and housing. CSW printed paperwork off and provided to patient.  Mickael Mcnutt LCSW

## 2021-04-07 ENCOUNTER — Telehealth: Payer: Self-pay

## 2021-04-07 NOTE — Telephone Encounter (Signed)
Transition Care Management Unsuccessful Follow-up Telephone Call  Date of discharge and from where:  04/04/2021, [facility]   Attempts:  1st Attempt  Reason for unsuccessful TCM follow-up call:  Left voice message on # [phone].  Need to discuss scheduling a follow up appointment with Dr Wallace/[facility]

## 2021-04-08 ENCOUNTER — Telehealth: Payer: Self-pay

## 2021-04-08 NOTE — Telephone Encounter (Signed)
Transition Care Management Unsuccessful Follow-up Telephone Call  Date of discharge and from where:  04/04/2021, [facility]   Attempts:  2nd Attempt  Reason for unsuccessful TCM follow-up call:  Left voice message on # [phone].  Need to discuss scheduling a follow up appointment with Dr Earlene Plater

## 2021-04-09 ENCOUNTER — Telehealth: Payer: Self-pay

## 2021-04-09 NOTE — Telephone Encounter (Signed)
Transition Care Management Follow-up Telephone Call  Date of discharge and from where: 04/04/2021, [facility]   How have you been since you were released from the hospital? He stated that he is feeling better  Any questions or concerns? Yes - he lost the medicaid application that he was given in the hospital.  He will need another application.  He currently has insurance with [employer] through his employer, [employer].  He is out on short term disability at this time and is not sure when/if he will be able to return to work.  He is also interested in permanent housing.   Items Reviewed:  Did the pt receive and understand the discharge instructions provided? Yes   Medications obtained and verified? Yes  - he said he has all medications except the cyanocobalamin which he plans to pick up today. He did not have any questions about his med regime.   Other? No   Any new allergies since your discharge? No   Do you have support at home? Yes  - currently living in a rooming house  Home Care and Equipment/Supplies: Were home health services ordered? no If so, what is the name of the agency? n/a  Has the agency set up a time to come to the patient's home? not applicable Were any new equipment or medical supplies ordered?  Yes: Trilogy NIV What is the name of the medical supply agency? [facility] Were you able to get the supplies/equipment? yes Do you have any questions related to the use of the equipment or supplies? Yes: he stated that the mask for the Trilogy leaks and he needs a bigger mask. He has contacted [employer] [facility] and they are supposed to be bringing him a larger mask today.   He already has O2 at home from [place]. He said that he uses it all the time @ 3L  Patient does not have a glucometer   Functional Questionnaire: (I = Independent and D = Dependent) ADLs: independent  Follow up appointments reviewed:   PCP Hospital f/u appt confirmed? Yes  Ricky Stabs, NP  - 04/16/2021 @ 1050  Specialist Hospital f/u appt confirmed? Yes - pulmonary  - 05/12/2021.  Outpatient PT 04/15/2021.    Are transportation arrangements needed? No   If their condition worsens, is the pt aware to call PCP or go to the Emergency Dept.? Yes  Was the patient provided with contact information for the PCP's office or ED? Yes  - he has the phone numbers for [facility] and [facility].  Was to pt encouraged to call back with questions or concerns? Yes

## 2021-04-09 NOTE — Telephone Encounter (Signed)
Transition Care Management Unsuccessful Follow-up Telephone Call  Date of discharge and from where:  04/04/2021, [facility]   Attempts:  3rd Attempt  Reason for unsuccessful TCM follow-up call:  Left voice message on # [phone].  Letter also sent to patient requesting he contact Primary Care at [facility] to schedule a follow up appointment.

## 2021-04-10 ENCOUNTER — Telehealth: Payer: Self-pay | Admitting: Internal Medicine

## 2021-04-10 ENCOUNTER — Telehealth: Payer: Self-pay

## 2021-04-10 NOTE — Telephone Encounter (Signed)
I was not aware of the patient being in the office today. I did speak with [place] from [employer], she was able to confirm the information shared from [employer]. I reached out to the patient several times phone as well as LVM to contact Erskine Squibb or myself and to follow up on the crisis information shared from [employer].

## 2021-04-10 NOTE — Telephone Encounter (Signed)
Following up on a message from [facility] Nurse Manager regarding a phone call from [facility] regarding mental safety of the patient.   CM contacted [employer] Benetta Spar) for clarification on intervention steps taken for patient safety.   Original Message: Pt participated in a research study for [employer] at the [place] [employer] [place]. He scored high on being suicidal.  He was provided resources of calling a hotline or 911 if these suicidal ideations continued. I spoke with [place]. Please follow up with pt regarding this issue.    CM contacted the patient by phone 2xs and LVM to contact CM to follow up.

## 2021-04-10 NOTE — Telephone Encounter (Signed)
Were you aware of this patient being in the office today?

## 2021-04-10 NOTE — Telephone Encounter (Signed)
Pt participated in a research study for [employer] at the [place] [facility] [place]. He scored high on being suicidal.  He was provided resources of calling a hotline or 911 if these suicidal ideations continued. I spoke with [place] and her contact info is [phone]. Please follow up with pt regarding this issue.

## 2021-04-11 ENCOUNTER — Telehealth: Payer: Self-pay | Admitting: Internal Medicine

## 2021-04-11 NOTE — Telephone Encounter (Signed)
Copied from CRM [id number]. Topic: General - Other >> Apr 10, 2021  5:19 PM Edwin Mcclain wrote: Reason for CRM: Pt requested to speak with Erskine Squibb. Pt requests call back

## 2021-04-11 NOTE — Telephone Encounter (Signed)
Patient name and DOB verified. Patient states he does not have suicidal ideations at this time. They have went away.  He states he was able to contact and speak to the resources that was provided to him via [facility] representative. He denies and does not endorse need for assistance at this time.   He was reminded of his appt. On 04/16/2021 at 10:50.  Patient verbalized understanding.   Also reminded about the message left by [facility] for f/u.

## 2021-04-14 ENCOUNTER — Telehealth: Payer: Self-pay

## 2021-04-14 NOTE — Telephone Encounter (Addendum)
Call returned to patient as he requested.  He said he was returning a call from the messages he received from this office. Call placed with [facility] present. He explained that he was depressed due to the recent loss of his mother, but he is feeling better today.  Monica reminded him of the resources that he has available to assist if he is feeling depressed and he said he understood.   He would like to have his own apartment but currently does not have the income to support this. He is also aware of the waiting lists for apartments in [place]. He is currently not able to work and has started the process for applying for disability.  He was given a medicaid application but lost it. Informed him that he can get a copy of the medicaid application  at  [facility] when he goes to his appointment.

## 2021-04-14 NOTE — Telephone Encounter (Signed)
RN CM and CM reached out to the patient by phone (returning a phone call from 04/10/21). Patient verified last name and DOB for staff. Staff discussed with patient reason for following up on the call from [facility]. Patient shared they were in a tough space last week with the recent loss of their mom. Patient reported being in a "better space" today mentally. CM expressed condolences to the patient for their recent loss. CM recalled for the patient to utilize mental health resources provided from his current outpatient therapy sessions.   RN Case Manager inquired about the patient's current living situation. Patient shared he would like his own living space without others smoking. He acknowledged many places are on a wait list.   Patient mentioned he has recently applied to disability though his job with [employer] and is in the process applying for long term disability with an attorney.   Patient has an upcoming appt with his pcp (Staff will provide him with a new Medicaid application).  Case Managers encouraged patient to continue with searching for a new residence, to follow up with his disability and to reach out to  CM, if he needs further assistance.

## 2021-04-15 ENCOUNTER — Ambulatory Visit: Payer: 59 | Attending: Nurse Practitioner

## 2021-04-16 ENCOUNTER — Ambulatory Visit: Payer: 59 | Admitting: Family

## 2021-04-27 NOTE — Progress Notes (Signed)
TRANSITION OF CARE VISIT   Primary Care Physician (PCP):  Jayme Cloud                        [facility]   [address]  [place],  [place]  [address]  Phone: [phone]  Fax: [phone]  Date of Admission: 03/31/2021   Date of Discharge: 04/04/2021  Transitions of Care Call: 04/09/2021 Robyne Peers, RN  Discharged from: [facility]  Discharge Diagnosis:  - Acute exacerbation of chronic obstructive pulmonary disease  - Acute on chronic respiratory failure with hypoxia  - Chronic diastolic CHF  - Type 2 diabetes mellitus with complication, without long-term current use of insulin - Shortness of breath - Anemia  Summary of Admission per MD note:  Patient is a54 y.o.malewith history of COPD, probable OSA/OHS, chronic hypoxic respiratory failure on 3 L of oxygen, chronic diastolic heart failure, lymphedema, HTN, DM-2 who presented with worsening shortness of breath-was found to have acute on chronic hypoxic/hypercarbic respiratory failure-he was placed on BiPAP and admitted to the hospitalist service. See below for further details.  Acute on chronic hypercarbic/hypoxic respiratory failure: Due to OHS/OSA exacerbation.Much improved after positive pressure ventilation-when sleeping at night. Case management consulted-subsequently trilogy machine has been arranged for patient on discharge.  Evaluated by PCCM this hospitalization-plans of outpatient follow-up.  COPD exacerbation: Improve-continue bronchodilators as previous.  Chronic diastolic heart failure:Has lymphedema at baseline-maintain on IV Lasix-lower extremity Dopplers negative for DVT.  Resume diuretic regimen on discharge.   Normocytic anemia: Mild-stable for outpatient  HTN: BP stable-continue Coreg/losartan.  NIO:[id number] statin  DM-2 (A1c 7.7 on 4/12): CBG stable-continue SSI-resume  oral hypoglycemic agents on discharge.  Presumed OSA/OHS: See above  Morbid Obesity: Estimated body mass index is 56.79 kg/m as calculated from the following: Height as of this encounter: 5\' 11"  (1.803 m). Weight as of this encounter: 184.7 kg.  Discharge Diagnoses:  Principal Problem:   Acute exacerbation of chronic obstructive pulmonary disease (COPD) (HCC) Active Problems:   HTN (hypertension)   Type 2 diabetes mellitus with complication, without long-term current use of insulin (HCC)   Acute on chronic respiratory failure with hypoxia (HCC)   SOB (shortness of breath)   Anemia   Chronic diastolic CHF (congestive heart failure) (HCC)  Discharge Instructions:  Activity:  As tolerated   TODAY's VISIT 1. COPD FOLLOW-UP: 2. CHRONIC RESPIRATORY FAILURE FOLLOW-UP: COPD status: better Oxygen use: yes Dyspnea frequency: no Cough frequency: sometimes  Productive cough: no Comments: Using 3 liters O2 when at home. Reports he lost portable oxygen machine and scheduled to receive a replacement. Has not been taking bronchodilator because he was not aware to have been doing so. Still using Trilogy machine and mask has been corrected to prevent leaking. Reports he is aware of Pulmonology appointment scheduled 05/12/2021.  3. HEART FAILURE FOLLOW-UP: Currently taking:  See med list Taking ACEI/ARB?  yes Taking beta blocker?  yes Adherence with salt restriction: yes  Shortness of breath? no Leg swelling? about the same since hospital discharge, lymphedema Monitoring weight at home? no Last echocardiogram:  04/03/2021 Last ejection fraction: 55-60% Followed by Cardiology? Yes Date last seen by Cardiology: Reports seen a couple months ago. Reports no visits with PT as of yet.  4. HYPERTENSION FOLLOW-UP: Currently taking: see medication list Med Adherence: [x]  Yes    []  No Medication side effects: []  Yes    [x]  No Adherence with salt  restriction (low-salt diet): [x]  Yes     []  No Exercise: Yes [x]   Home Monitoring?: []  Yes    [x]  No Monitoring Frequency: []  Yes    [x]  No Home BP results range: []  Yes    [x]  No Smoking []  Yes [x]  No  SOB? []  Yes    [x]  No Chest Pain?: []  Yes    [x]  No Leg swelling?: lymphedema Headaches?: []  Yes    [x]  No Dizziness? []  Yes    [x]  No Comments:   5. HYPERLIPIDEMIA FOLLOW-UP:o Med Adherence: [x]  Yes    []  No Medication side effects: []  Yes    [x]  No  6. DIABETES TYPE 2 FOLLOW-UP: Last A1C: 7.7% on 04/01/2021 Med Adherence:  [x]  Yes    []  No Medication side effects:  []  Yes    [x]  No Home Monitoring?  []  Yes    [x]  No Diet Adherence: [x]  Yes    []  No Exercise: [x]  Yes    []  No Last eye exam: over 1 year    Patient/Caregiver self-reported problems/concerns: No. Reports feeling better since hospital discharge. He is working on approved and plans to call for an update soon.  MEDICATIONS  Medication Reconciliation conducted with patient/caregiver? (Yes/ No):Yes  New medications prescribed/discontinued upon discharge? (Yes/No): Yes   Barriers identified related to medications:  Yes - he lost the medicaid application that he was given in the hospital.  He will need another application.  He currently has insurance with through his employer, .  He is out on short term disability at this time and is not sure when/if he will be able to return to work.  He is also interested in permanent housing.   LABS  Lab Reviewed (Yes/No/NA): Yes  PHYSICAL EXAM:   Vitals with BMI 04/28/2021 04/04/2021 04/04/2021  Height 5' 10.984" - -  Weight 400 lbs 13 oz - -  BMI 55.93 - -  Systolic 137 134  Diastolic 85 79 92  Pulse 78 63 63   General appearance - alert, well appearing, and in no distress and oriented to person, place, and time Mental status - alert, oriented to person, place, and time, normal mood, behavior, speech, dress, motor activity, and thought  processes Eyes - pupils equal and reactive, extraocular eye movements intact Neck - supple, no significant adenopathy Chest - clear to auscultation, no wheezes, rales or rhonchi, symmetric air entry, no tachypnea, retractions or cyanosis Heart - normal rate, regular rhythm, normal S1, S2, no murmurs, rubs, clicks or gallops Neurological - alert, oriented, normal speech, no focal findings or movement disorder noted, neck supple without rigidity  ASSESSMENT: 1. Hospital discharge follow-up: - Improved since hospital discharge.  2. Acute exacerbation of chronic obstructive pulmonary disease (COPD) (HCC): 3. Acute on chronic respiratory failure with hypoxia and hypercapnia (HCC): - Stable in office without cardiopulmonary distress.  - Continue bronchodilator, home oxygen, and Trilogy machine as prescribed.  - CMP to check kidney function, liver function, and electrolyte balance.  - CBC to screen for anemia. - Keep appointment with Pulmonology scheduled 05/12/2021. - Comprehensive metabolic panel - CBC  4. Chronic diastolic CHF (congestive heart failure) (HCC): 5. SOB (shortness of breath): - Stable in office without cardiopulmonary distress.  - Continue Carvedilol and Losartan as prescribed.  - CMP to check kidney function, liver function, and electrolyte balance.  - CBC to screen for anemia. - Managed by Cardiology. Keep all scheduled appointments.  - Comprehensive metabolic panel -  CBC  6. Type 2 diabetes mellitus with complication, without long-term current use of insulin (HCC): - Last hemoglobin A1c not at goal at 7.7% on 04/01/2021, goal < 7%. This is increased from previous hemoglobin A1c 7.1% on 09/05/2020. Next hemoglobin A1c due July 2022.  - Continue Sitagliptin as prescribed.  - Begin Metformin XR as prescribed. Reports side effects of diarrhea while on Metformin in the past.  - Discussed the importance of healthy eating habits, low-carbohydrate diet, low-sugar diet, regular  aerobic exercise (at least 150 minutes a week as tolerated) and medication compliance to achieve or maintain control of diabetes. - CMP to check kidney function, liver function, and electrolyte balance.  - CBC to screen for anemia. - Follow-up with primary provider in 4 weeks or sooner if needed. - Comprehensive metabolic panel - CBC - metFORMIN (GLUCOPHAGE XR) 500 MG 24 hr tablet; Take 1 tablet (500 mg total) by mouth daily with breakfast.  Dispense: 30 tablet; Refill: 0 - sitaGLIPtin (JANUVIA) 25 MG tablet; Take 1 tablet (25 mg total) by mouth daily.  Dispense: 90 tablet; Refill: 0  7. Essential hypertension: - Blood pressure close to goal during today's visit.  - Continue Lisinopril and Carvedilol as prescribed.  Counseled on blood pressure goal of less than 130/80, low-sodium, DASH diet, medication compliance, 150 minutes of moderate intensity exercise per week as tolerated. Discussed medication compliance, adverse effects. - CMP to check kidney function, liver function, and electrolyte balance.  - CBC to screen for anemia. - Managed by Cardiology. Keep all scheduled appointments.  - Comprehensive metabolic panel - CBC  8. Diabetic eye exam [facility]): - Referral to Ophthalmology for diabetic eye exam.  - Ambulatory referral to Ophthalmology   PATIENT EDUCATION PROVIDED: See AVS   FOLLOW-UP (Include any further testing or referrals): Follow-up with primary provider in 4 weeks for diabetes. Keep appointments with Pulmonology and Cardiology.

## 2021-04-28 ENCOUNTER — Ambulatory Visit (INDEPENDENT_AMBULATORY_CARE_PROVIDER_SITE_OTHER): Payer: 59 | Admitting: Family

## 2021-04-28 ENCOUNTER — Other Ambulatory Visit: Payer: Self-pay

## 2021-04-28 ENCOUNTER — Encounter: Payer: Self-pay | Admitting: Family

## 2021-04-28 VITALS — BP 137/85 | HR 78 | Temp 98.4°F | Resp 14 | Ht 70.98 in | Wt >= 6400 oz

## 2021-04-28 DIAGNOSIS — D649 Anemia, unspecified: Secondary | ICD-10-CM

## 2021-04-28 DIAGNOSIS — Z0100 Encounter for examination of eyes and vision without abnormal findings: Secondary | ICD-10-CM

## 2021-04-28 DIAGNOSIS — Z09 Encounter for follow-up examination after completed treatment for conditions other than malignant neoplasm: Secondary | ICD-10-CM | POA: Diagnosis not present

## 2021-04-28 DIAGNOSIS — R0602 Shortness of breath: Secondary | ICD-10-CM

## 2021-04-28 DIAGNOSIS — J9622 Acute and chronic respiratory failure with hypercapnia: Secondary | ICD-10-CM

## 2021-04-28 DIAGNOSIS — I10 Essential (primary) hypertension: Secondary | ICD-10-CM

## 2021-04-28 DIAGNOSIS — I5032 Chronic diastolic (congestive) heart failure: Secondary | ICD-10-CM

## 2021-04-28 DIAGNOSIS — J441 Chronic obstructive pulmonary disease with (acute) exacerbation: Secondary | ICD-10-CM | POA: Diagnosis not present

## 2021-04-28 DIAGNOSIS — J9621 Acute and chronic respiratory failure with hypoxia: Secondary | ICD-10-CM

## 2021-04-28 DIAGNOSIS — E118 Type 2 diabetes mellitus with unspecified complications: Secondary | ICD-10-CM

## 2021-04-28 DIAGNOSIS — E119 Type 2 diabetes mellitus without complications: Secondary | ICD-10-CM

## 2021-04-28 MED ORDER — METFORMIN HCL ER 500 MG PO TB24: 500.0000 mg | ORAL_TABLET | Freq: Every day | ORAL | 0 refills | Status: DC

## 2021-04-28 NOTE — Progress Notes (Signed)
Hospital follow up?

## 2021-04-28 NOTE — Patient Instructions (Signed)

## 2021-04-29 LAB — CBC
Hematocrit: 39.4 % (ref 37.5–51.0)
Hemoglobin: 12.1 g/dL — ABNORMAL LOW (ref 13.0–17.7)
MCH: 23.6 pg — ABNORMAL LOW (ref 26.6–33.0)
MCHC: 30.7 g/dL — ABNORMAL LOW (ref 31.5–35.7)
MCV: 77 fL — ABNORMAL LOW (ref 79–97)
Platelets: 238 10*3/uL (ref 150–450)
RBC: 5.12 x10E6/uL (ref 4.14–5.80)
RDW: 18 % — ABNORMAL HIGH (ref 11.6–15.4)
WBC: 6 10*3/uL (ref 3.4–10.8)

## 2021-04-29 LAB — COMPREHENSIVE METABOLIC PANEL
ALT: 21 IU/L (ref 0–44)
AST: 18 IU/L (ref 0–40)
Albumin/Globulin Ratio: 1.6 (ref 1.2–2.2)
Albumin: 4.5 g/dL (ref 3.8–4.9)
Alkaline Phosphatase: 115 IU/L (ref 44–121)
BUN/Creatinine Ratio: 10 (ref 9–20)
BUN: 9 mg/dL (ref 6–24)
Bilirubin Total: <0.2 mg/dL (ref 0.0–1.2)
CO2: 28 mmol/L (ref 20–29)
Calcium: 9.6 mg/dL (ref 8.7–10.2)
Chloride: 96 mmol/L (ref 96–106)
Creatinine, Ser: 0.88 mg/dL (ref 0.76–1.27)
Globulin, Total: 2.9 g/dL (ref 1.5–4.5)
Glucose: 159 mg/dL — ABNORMAL HIGH (ref 65–99)
Potassium: 4.4 mmol/L (ref 3.5–5.2)
Sodium: 141 mmol/L (ref 134–144)
Total Protein: 7.4 g/dL (ref 6.0–8.5)
eGFR: 103 mL/min/{1.73_m2} (ref >59)

## 2021-04-29 MED ORDER — SITAGLIPTIN PHOSPHATE 25 MG PO TABS: 25.0000 mg | ORAL_TABLET | Freq: Every day | ORAL | 0 refills | Status: DC

## 2021-04-29 NOTE — Progress Notes (Signed)
Kidney function normal.   Liver function normal.   Mild anemia. Increase iron-rich foods such as dark green leafy vegetables and dried fruit such as raisins and apricots. Try over-the-counter daily iron supplement. Patient encouraged to have rechecked in 4 months.

## 2021-05-12 ENCOUNTER — Institutional Professional Consult (permissible substitution): Payer: 59 | Admitting: Pulmonary Disease

## 2021-05-26 ENCOUNTER — Encounter (INDEPENDENT_AMBULATORY_CARE_PROVIDER_SITE_OTHER): Payer: Self-pay

## 2021-05-26 ENCOUNTER — Other Ambulatory Visit: Payer: Self-pay

## 2021-05-26 ENCOUNTER — Encounter: Payer: Self-pay | Admitting: Internal Medicine

## 2021-05-26 ENCOUNTER — Ambulatory Visit (INDEPENDENT_AMBULATORY_CARE_PROVIDER_SITE_OTHER): Payer: 59 | Admitting: Internal Medicine

## 2021-05-26 VITALS — BP 131/84 | HR 78 | Temp 99.4°F | Resp 20 | Wt >= 6400 oz

## 2021-05-26 DIAGNOSIS — J9611 Chronic respiratory failure with hypoxia: Secondary | ICD-10-CM | POA: Diagnosis not present

## 2021-05-26 DIAGNOSIS — J9612 Chronic respiratory failure with hypercapnia: Secondary | ICD-10-CM

## 2021-05-26 DIAGNOSIS — E118 Type 2 diabetes mellitus with unspecified complications: Secondary | ICD-10-CM | POA: Diagnosis not present

## 2021-05-26 MED ORDER — ACCU-CHEK AVIVA PLUS VI STRP: ORAL_STRIP | 12 refills | Status: DC

## 2021-05-26 MED ORDER — ACCU-CHEK AVIVA PLUS W/DEVICE KIT: 1.0000 | PACK | Freq: Every day | 0 refills | Status: DC

## 2021-05-26 MED ORDER — LANCETS MISC: 1.0000 "application " | Freq: Every day | 0 refills | Status: DC

## 2021-05-26 NOTE — Progress Notes (Addendum)
Subjective:    Edwin Mcclain - 55 y.o. male MRN [id number]  Date of birth: 04/27/1966  HPI  Edwin Mcclain is here for follow up of DM. Also has a history of chronic respiratory failure related to his OSA and COPD. Establishing care with pulmonology in a few weeks. He uses CPAP at home. Uses 3L O2 at home. Came to visit without portable O2 therapy today. Says he is on inhaler but cant remember what type.   Diabetes mellitus, Type 2 Disease Monitoring             Blood Sugar Ranges: Does not monitor because doesn't have supplies but reports he knows how to monitor.              Polyuria: no             Visual problems: no   Urine Microalbumin 5 in Sept 2021- on Losartan  Last A1C: 7.7 (April 2022)   Medications: Januvia 25 mg, Metformin 500 XR 500 mg daily (started in May)  Medication Compliance: yes  Medication Side Effects             Hypoglycemia: no Reports that he has been taking Metformin but having multiple episodes of diarrhea per day since starting.    Health Maintenance:  Health Maintenance Due  Topic Date Due  . COVID-19 Vaccine (1) Never done  . Pneumococcal Vaccine 62-30 Years old (1 of 2 - PPSV23) Never done  . OPHTHALMOLOGY EXAM  Never done  . Hepatitis C Screening  Never done  . Fecal DNA (Cologuard)  Never done  . Zoster Vaccines- Shingrix (1 of 2) Never done    -  reports that he has never smoked. He has never used smokeless tobacco. - Review of Systems: Per HPI. - Past Medical History: Patient Active Problem List   Diagnosis Date Noted  . Acute on chronic respiratory failure with hypoxia ([place]) 04/01/2021  . SOB (shortness of breath) 04/01/2021  . Anemia 04/01/2021  . Chronic diastolic CHF (congestive heart failure) ([place]) 04/01/2021  . Acute exacerbation of chronic obstructive pulmonary disease (COPD) ([place]) 03/31/2021  . OSA (obstructive sleep apnea) 09/12/2019  . Overgrown toenails 09/12/2019  . Acute on chronic diastolic HF (heart failure) ([place]) 08/24/2019   . Snoring 08/24/2019  . AKI (acute kidney injury) ([place]) 08/24/2019  . Type 2 diabetes mellitus with complication, without long-term current use of insulin ([place]) 08/24/2019  . Acute on chronic respiratory failure with hypoxia and hypercapnia ([place]) 07/14/2019  . Acute CHF (congestive heart failure) ([place]) 02/08/2019  . Acute respiratory failure with hypoxia ([place]) 02/08/2019  . HTN (hypertension)   . Morbid obesity (HCC)    - Medications: reviewed and updated   Objective:   Physical Exam BP 131/84   Pulse 78   Temp 99.4 F (37.4 C)   Resp 20   Wt (!) 407 lb (184.6 kg)   SpO2 (!) 88%   BMI 56.79 kg/m  Physical Exam Constitutional:      General: He is not in acute distress.    Appearance: He is not diaphoretic.  Cardiovascular:     Rate and Rhythm: Normal rate.  Pulmonary:     Effort: Pulmonary effort is normal. No respiratory distress.  Musculoskeletal:        General: Normal range of motion.  Skin:    General: Skin is warm and dry.  Neurological:     Mental Status: He is alert and oriented to person, place, and time.  Psychiatric:  Mood and Affect: Affect normal.        Judgment: Judgment normal.            Assessment & Plan:   1. Type 2 diabetes mellitus with complication, without long-term current use of insulin (HCC) Last A1c 7.7. Patient unable to tolerate Metformin XR due to diarrhea. Discontinue. Emphasized need for dietary adherence. Continue Jardiance. Will plan to have patient starting monitoring glucose to better direct therapy. Return in 4 weeks for A1c and medication adjustment.  - Blood Glucose Monitoring Suppl (ACCU-CHEK AVIVA PLUS) w/Device KIT; 1 Device by Does not apply route daily.  Dispense: 1 kit; Refill: 0 - Lancets MISC; 1 application by Does not apply route daily.  Dispense: 100 each; Refill: 0 - glucose blood (ACCU-CHEK AVIVA PLUS) test strip; Use as instructed  Dispense: 100 each; Refill: 12  2. Chronic respiratory failure with  hypoxia and hypercapnia (HCC) Multifactorial complicated by OSA and COPD as well as history of CHF. He is soon establishing with pulmonology. Unclear what inhaler he is using at home as not on medication list and is unable to recall. Encouraged continued compliance with CPAP and home O2.   Phill Myron, D.O. 05/26/2021, 3:59 PM Primary Care at [facility]

## 2021-05-26 NOTE — Progress Notes (Signed)
States metformin still makes him use the bathroom between 3 -5  Times/day  O2 saturation 88% RA Does not wear Oxygen

## 2021-05-26 NOTE — Patient Instructions (Signed)
Diabetes Mellitus and Nutrition, Adult When you have diabetes, or diabetes mellitus, it is very important to have healthy eating habits because your blood sugar (glucose) levels are greatly affected by what you eat and drink. Eating healthy foods in the right amounts, at about the same times every day, can help you:  Control your blood glucose.  Lower your risk of heart disease.  Improve your blood pressure.  Reach or maintain a healthy weight. What can affect my meal plan? Every person with diabetes is different, and each person has different needs for a meal plan. Your health care provider may recommend that you work with a dietitian to make a meal plan that is best for you. Your meal plan may vary depending on factors such as:  The calories you need.  The medicines you take.  Your weight.  Your blood glucose, blood pressure, and cholesterol levels.  Your activity level.  Other health conditions you have, such as heart or kidney disease. How do carbohydrates affect me? Carbohydrates, also called carbs, affect your blood glucose level more than any other type of food. Eating carbs naturally raises the amount of glucose in your blood. Carb counting is a method for keeping track of how many carbs you eat. Counting carbs is important to keep your blood glucose at a healthy level, especially if you use insulin or take certain oral diabetes medicines. It is important to know how many carbs you can safely have in each meal. This is different for every person. Your dietitian can help you calculate how many carbs you should have at each meal and for each snack. How does alcohol affect me? Alcohol can cause a sudden decrease in blood glucose (hypoglycemia), especially if you use insulin or take certain oral diabetes medicines. Hypoglycemia can be a life-threatening condition. Symptoms of hypoglycemia, such as sleepiness, dizziness, and confusion, are similar to symptoms of having too much  alcohol.  Do not drink alcohol if: ? Your health care provider tells you not to drink. ? You are pregnant, may be pregnant, or are planning to become pregnant.  If you drink alcohol: ? Do not drink on an empty stomach. ? Limit how much you use to:  0-1 drink a day for women.  0-2 drinks a day for men. ? Be aware of how much alcohol is in your drink. In the [place] one drink equals one 12 oz bottle of beer (355 mL), one 5 oz glass of wine (148 mL), or one 1 oz glass of hard liquor (44 mL). ? Keep yourself hydrated with water, diet soda, or unsweetened iced tea.  Keep in mind that regular soda, juice, and other mixers may contain a lot of sugar and must be counted as carbs. What are tips for following this plan? Reading food labels  Start by checking the serving size on the "Nutrition Facts" label of packaged foods and drinks. The amount of calories, carbs, fats, and other nutrients listed on the label is based on one serving of the item. Many items contain more than one serving per package.  Check the total grams (g) of carbs in one serving. You can calculate the number of servings of carbs in one serving by dividing the total carbs by 15. For example, if a food has 30 g of total carbs per serving, it would be equal to 2 servings of carbs.  Check the number of grams (g) of saturated fats and trans fats in one serving. Choose foods that have   a low amount or none of these fats.  Check the number of milligrams (mg) of salt (sodium) in one serving. Most people should limit total sodium intake to less than 2,300 mg per day.  Always check the nutrition information of foods labeled as "low-fat" or "nonfat." These foods may be higher in added sugar or refined carbs and should be avoided.  Talk to your dietitian to identify your daily goals for nutrients listed on the label. Shopping  Avoid buying canned, pre-made, or processed foods. These foods tend to be high in fat, sodium, and added  sugar.  Shop around the outside edge of the grocery store. This is where you will most often find fresh fruits and vegetables, bulk grains, fresh meats, and fresh dairy. Cooking  Use low-heat cooking methods, such as baking, instead of high-heat cooking methods like deep frying.  Cook using healthy oils, such as olive, canola, or sunflower oil.  Avoid cooking with butter, cream, or high-fat meats. Meal planning  Eat meals and snacks regularly, preferably at the same times every day. Avoid going long periods of time without eating.  Eat foods that are high in fiber, such as fresh fruits, vegetables, beans, and whole grains. Talk with your dietitian about how many servings of carbs you can eat at each meal.  Eat 4-6 oz (112-168 g) of lean protein each day, such as lean meat, chicken, fish, eggs, or tofu. One ounce (oz) of lean protein is equal to: ? 1 oz (28 g) of meat, chicken, or fish. ? 1 egg. ?  cup (62 g) of tofu.  Eat some foods each day that contain healthy fats, such as avocado, nuts, seeds, and fish.   What foods should I eat? Fruits Berries. Apples. Oranges. Peaches. Apricots. Plums. Grapes. Mango. Papaya. Pomegranate. Kiwi. Cherries. Vegetables Lettuce. Spinach. Leafy greens, including kale, chard, collard greens, and mustard greens. Beets. Cauliflower. Cabbage. Broccoli. Carrots. Green beans. Tomatoes. Peppers. Onions. Cucumbers. Brussels sprouts. Grains Whole grains, such as whole-wheat or whole-grain bread, crackers, tortillas, cereal, and pasta. Unsweetened oatmeal. Quinoa. Brown or wild rice. Meats and other proteins Seafood. Poultry without skin. Lean cuts of poultry and beef. Tofu. Nuts. Seeds. Dairy Low-fat or fat-free dairy products such as milk, yogurt, and cheese. The items listed above may not be a complete list of foods and beverages you can eat. Contact a dietitian for more information. What foods should I avoid? Fruits Fruits canned with  syrup. Vegetables Canned vegetables. Frozen vegetables with butter or cream sauce. Grains Refined [place] flour and flour products such as bread, pasta, snack foods, and cereals. Avoid all processed foods. Meats and other proteins Fatty cuts of meat. Poultry with skin. Breaded or fried meats. Processed meat. Avoid saturated fats. Dairy Full-fat yogurt, cheese, or milk. Beverages Sweetened drinks, such as soda or iced tea. The items listed above may not be a complete list of foods and beverages you should avoid. Contact a dietitian for more information. Questions to ask a health care provider  Do I need to meet with a diabetes educator?  Do I need to meet with a dietitian?  What number can I call if I have questions?  When are the best times to check my blood glucose? Where to find more information:  American Diabetes Association: diabetes.org  Academy of Nutrition and Dietetics: www.eatright.org  National Institute of Diabetes and Digestive and Kidney Diseases: www.niddk.nih.gov  Association of Diabetes Care and Education Specialists: www.diabeteseducator.org Summary  It is important to have healthy eating   habits because your blood sugar (glucose) levels are greatly affected by what you eat and drink.  A healthy meal plan will help you control your blood glucose and maintain a healthy lifestyle.  Your health care provider may recommend that you work with a dietitian to make a meal plan that is best for you.  Keep in mind that carbohydrates (carbs) and alcohol have immediate effects on your blood glucose levels. It is important to count carbs and to use alcohol carefully. This information is not intended to replace advice given to you by your health care provider. Make sure you discuss any questions you have with your health care provider. Document Revised: 11/14/2019 Document Reviewed: 11/14/2019 Elsevier Patient Education  2021 Elsevier Inc.  

## 2021-06-16 ENCOUNTER — Institutional Professional Consult (permissible substitution): Payer: 59 | Admitting: Pulmonary Disease

## 2021-06-23 NOTE — Progress Notes (Signed)
Patient did not show for appointment.   

## 2021-06-24 ENCOUNTER — Encounter: Payer: 59 | Admitting: Family

## 2021-09-19 IMAGING — DX DG SHOULDER 2+V*L*
4 series · 4 of 4 positions shown · non-contrast
Comparison: None.

CLINICAL DATA: Chronic left shoulder pain. Limited range of motion.

EXAM:
LEFT SHOULDER - 2+ VIEW

[shoulder neutral ap (1 of 2)]
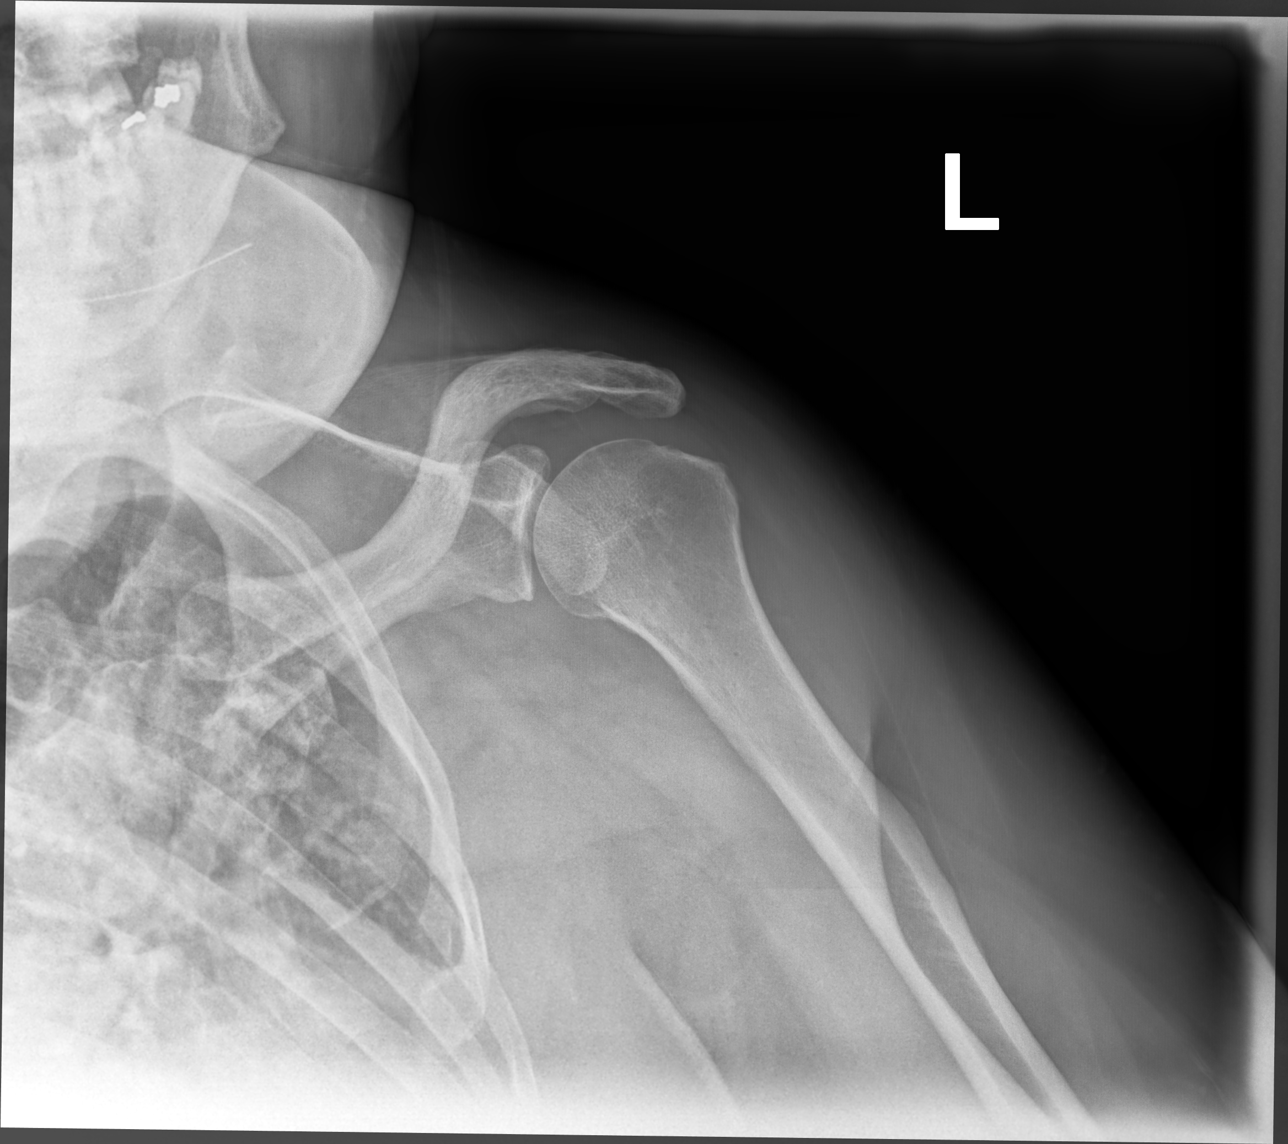

[shoulder neutral ap (2 of 2)]
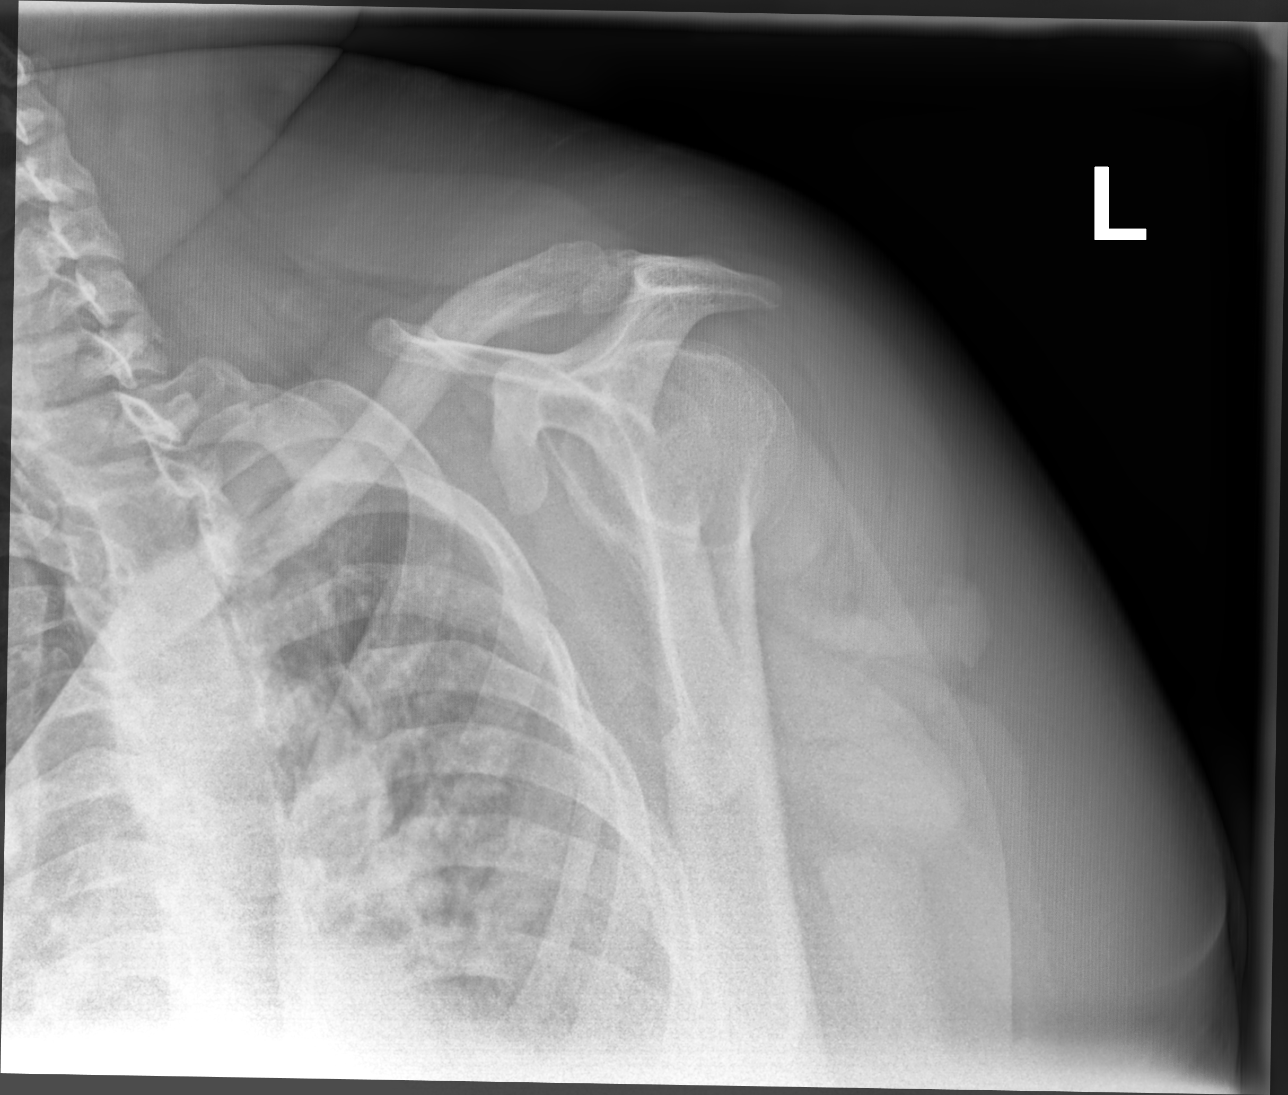

[shoulder axial 45° seated]
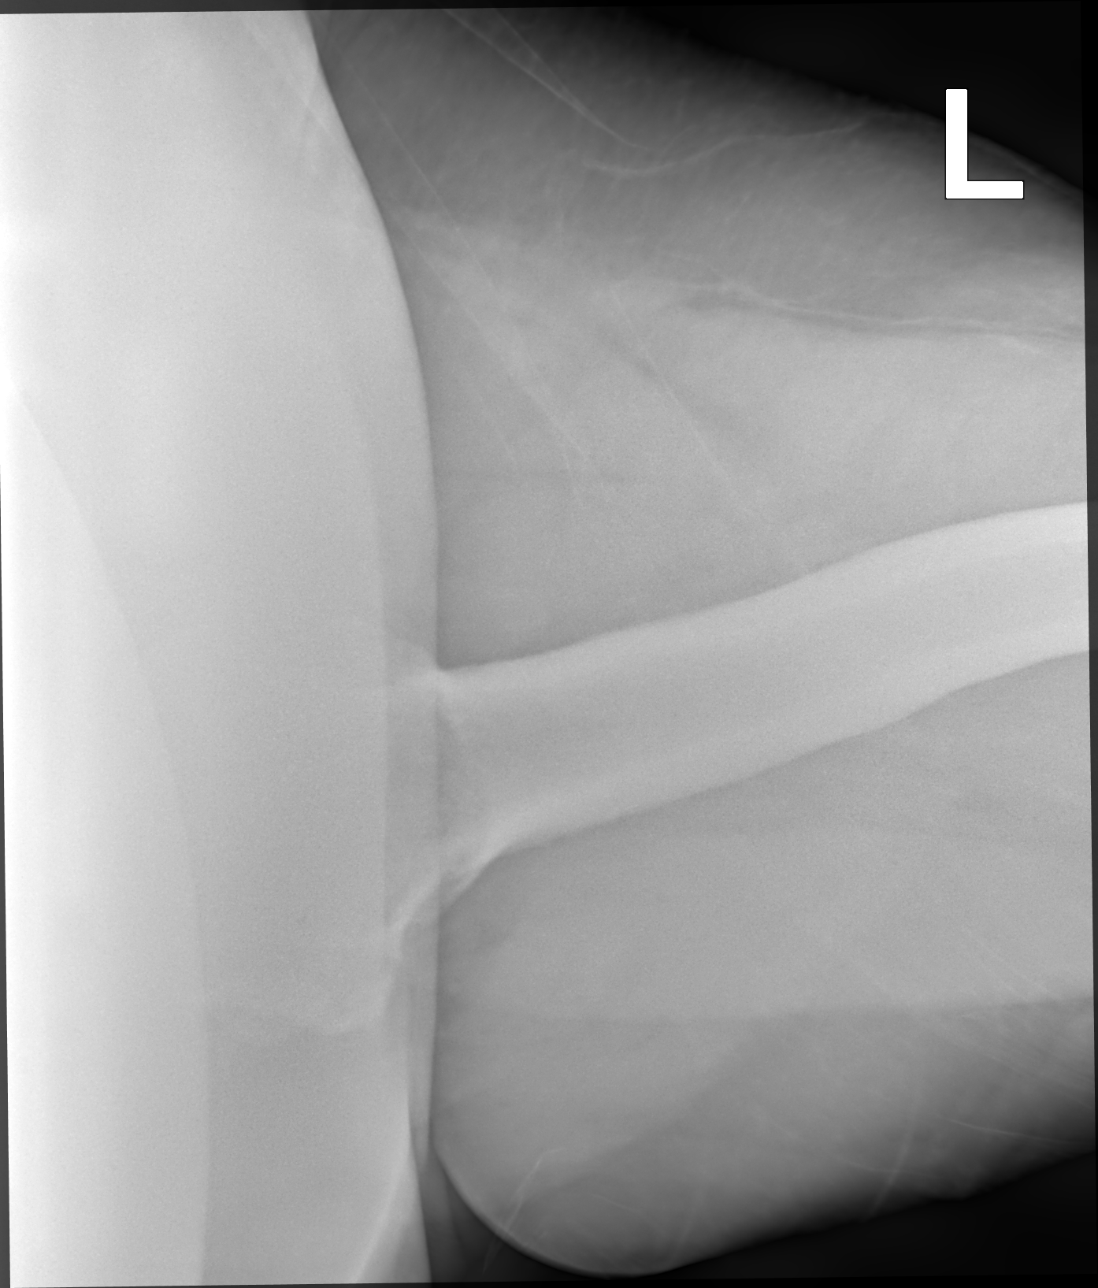

[shoulder internal rotation ap]
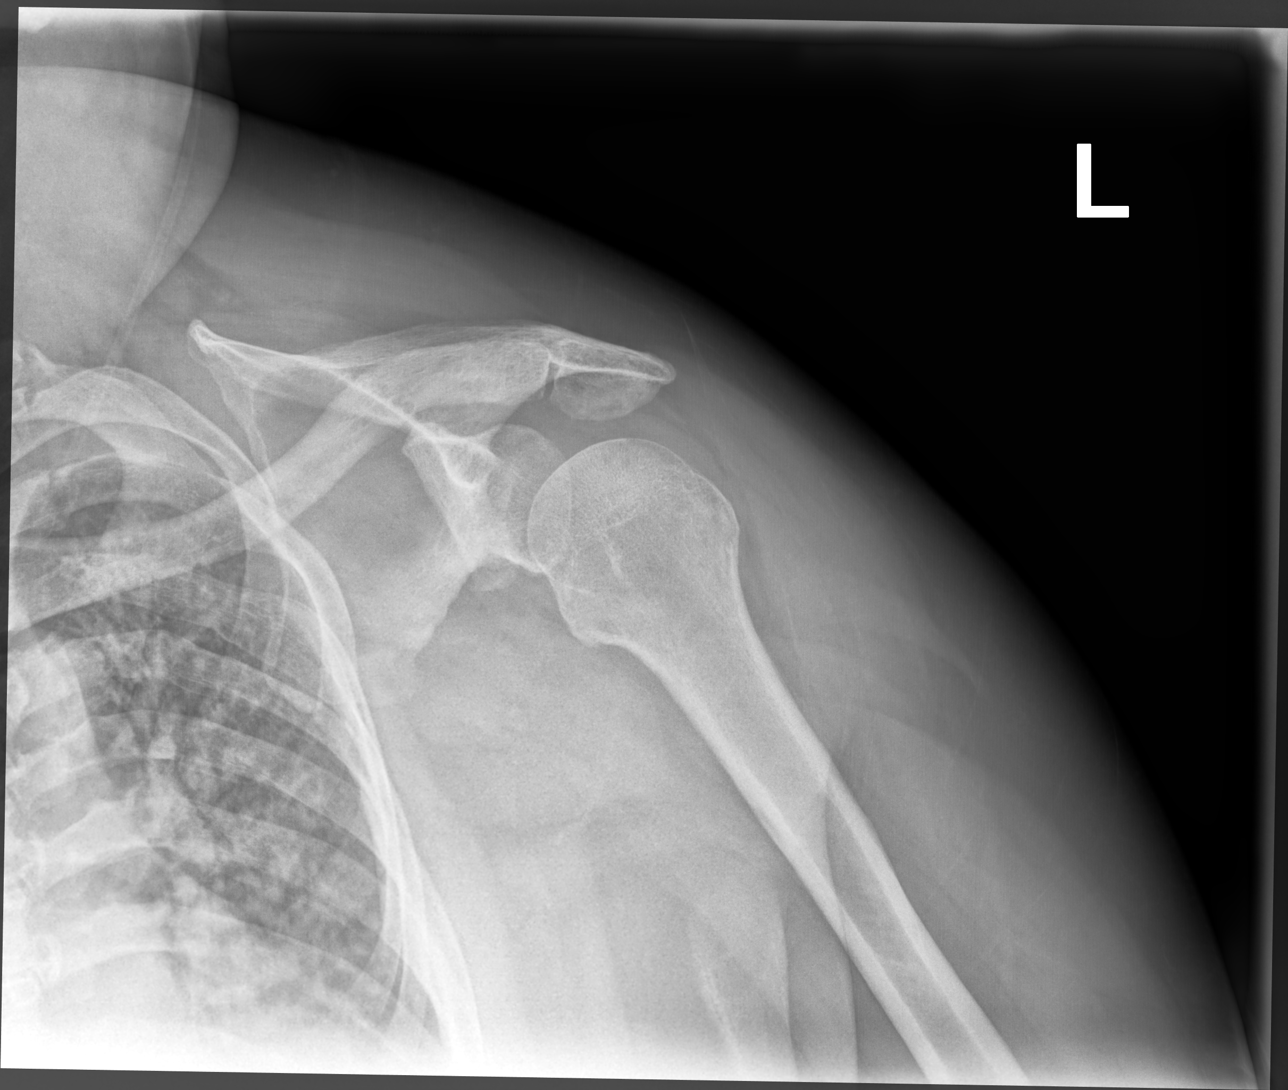

[4 of 4 positions shown; findings below may reference images not displayed]

FINDINGS: Degenerative changes in the AC joint with joint space narrowing and
spurring. Glenohumeral joint is maintained. No acute bony
abnormality. Specifically, no fracture, subluxation, or dislocation.
Soft tissues are intact. Airspace disease throughout the left lung.
IMPRESSION: Degenerative changes in the left AC joint. No acute bony
abnormality.

Airspace disease diffusely throughout the left lung concerning for
pneumonia.

## 2021-10-30 ENCOUNTER — Ambulatory Visit (INDEPENDENT_AMBULATORY_CARE_PROVIDER_SITE_OTHER): Payer: Self-pay | Admitting: Family Medicine

## 2021-10-30 ENCOUNTER — Other Ambulatory Visit: Payer: Self-pay

## 2021-10-30 VITALS — BP 121/70 | HR 95 | Temp 98.1°F | Resp 16 | Ht 71.0 in | Wt >= 6400 oz

## 2021-10-30 DIAGNOSIS — Z6841 Body Mass Index (BMI) 40.0 and over, adult: Secondary | ICD-10-CM

## 2021-10-30 DIAGNOSIS — E785 Hyperlipidemia, unspecified: Secondary | ICD-10-CM

## 2021-10-30 DIAGNOSIS — E1169 Type 2 diabetes mellitus with other specified complication: Secondary | ICD-10-CM

## 2021-10-30 DIAGNOSIS — E118 Type 2 diabetes mellitus with unspecified complications: Secondary | ICD-10-CM

## 2021-10-30 DIAGNOSIS — I10 Essential (primary) hypertension: Secondary | ICD-10-CM

## 2021-10-30 MED ORDER — LOSARTAN POTASSIUM 25 MG PO TABS: ORAL_TABLET | ORAL | 0 refills | Status: DC

## 2021-10-31 ENCOUNTER — Encounter: Payer: Self-pay | Admitting: Family Medicine

## 2021-10-31 NOTE — Progress Notes (Signed)
Established Patient Office Visit  Subjective:  Patient ID: Edwin Mcclain, male    DOB: 04-29-1966  Age: 55 y.o. MRN: [id number]  CC:  Chief Complaint  Patient presents with   Follow-up   Medication Refill   Diabetes    HPI Edwin Mcclain presents for follow up of chronic med issues including diabetes and hypertension. Patient denies acute complaints or concerns on today.   Past Medical History:  Diagnosis Date   Acute diastolic HF (heart failure) ([place]) 02/08/2019   Acute respiratory failure with hypoxia ([place]) 02/08/2019   CHF (congestive heart failure) (HCC)    COPD (chronic obstructive pulmonary disease) (HCC)    HTN (hypertension)    Morbid obesity (HCC)       Social History   Socioeconomic History   Marital status: Single    Spouse name: Not on file   Number of children: Not on file   Years of education: Not on file   Highest education level: Not on file  Occupational History   Not on file  Tobacco Use   Smoking status: Never   Smokeless tobacco: Never  Vaping Use   Vaping Use: Never used  Substance and Sexual Activity   Alcohol use: Never   Drug use: Never   Sexual activity: Not on file  Other Topics Concern   Not on file  Social History Narrative   Lives with cousin for now.  Works at [employer].     Social Determinants of Health   Financial Resource Strain: Not on file  Food Insecurity: Not on file  Transportation Needs: Not on file  Physical Activity: Not on file  Stress: Not on file  Social Connections: Not on file  Intimate Partner Violence: Not on file    ROS Review of Systems  All other systems reviewed and are negative.  Objective:   Today's Vitals: BP 121/70   Pulse 95   Temp 98.1 F (36.7 C) (Oral)   Resp 16   Ht '5\' 11"'  (1.803 m)   Wt (!) 401 lb 12.8 oz (182.3 kg)   SpO2 95%   BMI 56.04 kg/m   Physical Exam Vitals and nursing note reviewed.  Constitutional:      General: He is not in acute distress.    Appearance: He is obese.   Cardiovascular:     Rate and Rhythm: Normal rate and regular rhythm.  Pulmonary:     Effort: Pulmonary effort is normal.     Breath sounds: Normal breath sounds.  Abdominal:     Palpations: Abdomen is soft.     Tenderness: There is no abdominal tenderness.  Musculoskeletal:     Right lower leg: No edema.     Left lower leg: No edema.  Neurological:     General: No focal deficit present.     Mental Status: He is alert and oriented to person, place, and time.    Assessment & Plan:   1. Type 2 diabetes mellitus with complication, without long-term current use of insulin (HCC) Elevated  and increasing A1c. Will send to [facility] for med management and diabetic ed referral made. Compliance discussed.   2. Essential hypertension Appears stable. Continue present management. Meds refilled.   - losartan (COZAAR) 25 MG tablet; TAKE 1 TABLET(25 MG) BY MOUTH DAILY  Dispense: 90 tablet; Refill: 0  3. Hyperlipidemia associated with type 2 diabetes mellitus ([place]) Continue present management  4. Class 3 severe obesity due to excess calories without serious comorbidity with body mass index (  BMI) of 50.0 to 59.9 in adult [facility]) Referral to diabetic education as above - Ambulatory referral to diabetic education    Outpatient Encounter Medications as of 10/30/2021  Medication Sig   aspirin 81 MG EC tablet Take 1 tablet (81 mg total) by mouth daily.   atorvastatin (LIPITOR) 10 MG tablet TAKE 1 TABLET(10 MG) BY MOUTH DAILY (Patient taking differently: Take 10 mg by mouth daily.)   Blood Glucose Monitoring Suppl (ACCU-CHEK AVIVA PLUS) w/Device KIT 1 Device by Does not apply route daily.   carvedilol (COREG) 25 MG tablet Take 1 tablet (25 mg total) by mouth 2 (two) times daily with a meal.   glucose blood (ACCU-CHEK AVIVA PLUS) test strip Use as instructed   iron polysaccharides (NIFEREX) 150 MG capsule Take 150 mg by mouth daily.   Lancet Devices (ONE TOUCH DELICA LANCING DEV) MISC Use as directed.  Dx: E10.9, E11.9   Lancets MISC 1 application by Does not apply route daily.   losartan (COZAAR) 25 MG tablet TAKE 1 TABLET(25 MG) BY MOUTH DAILY   Melatonin 10 MG TABS Take 1 tablet by mouth at bedtime as needed (sleep).   sitaGLIPtin (JANUVIA) 25 MG tablet Take 1 tablet (25 mg total) by mouth daily.   torsemide (DEMADEX) 20 MG tablet TAKE 1 TABLET BY MOUTH TWICE DAILY (Patient taking differently: Take 20 mg by mouth 2 (two) times daily.)   vitamin B-12 1000 MCG tablet Take 1 tablet (1,000 mcg total) by mouth daily.   [DISCONTINUED] losartan (COZAAR) 25 MG tablet TAKE 1 TABLET(25 MG) BY MOUTH DAILY (Patient taking differently: Take 25 mg by mouth daily.)   No facility-administered encounter medications on file as of 10/30/2021.    Follow-up: Return in about 3 months (around 01/30/2022) for follow up.   Becky Sax, MD

## 2021-11-04 ENCOUNTER — Ambulatory Visit: Payer: Self-pay | Admitting: Pharmacist

## 2021-11-10 NOTE — Progress Notes (Incomplete)
° ° °  S:     Patient arrives ***.  Presents for diabetes evaluation, education, and management. Patient was referred and last seen by Primary Care Provider on Dr. Andrey Campanile on 10/30/2021.   Patient reports Diabetes was diagnosed in ***.   Family/Social History: ***  Insurance coverage/medication affordability: ***  Medication adherence reported *** .   Current diabetes medications include:  -Sitaglipitin 25 mg QD  Metformin was stopped due to diarrhea. Jardiance noted in chart, but not currently on med list.  Current hypertension medications include:  -Losartan 25 mg QD -Carvedilol 25 mg BID  Current hyperlipidemia medications include:  -Atorvastatin 10 mg QD  Patient {Actions; denies-reports:120008} hypoglycemic events.  Patient reported dietary habits: Eats *** meals/day Breakfast:*** Lunch:*** Dinner:*** Snacks:*** Drinks:***  Patient-reported exercise habits: ***   Patient {Actions; denies-reports:120008} nocturia (nighttime urination).  Patient {Actions; denies-reports:120008} neuropathy (nerve pain). Patient {Actions; denies-reports:120008} visual changes. Patient {Actions; denies-reports:120008} self foot exams.     O:  Physical Exam  ROS  Lab Results  Component Value Date   HGBA1C 7.7 (H) 04/01/2021   There were no vitals filed for this visit.  Lipid Panel     Component Value Date/Time   CHOL 165 05/29/2019 0953   TRIG 113 05/29/2019 0953   HDL 44 05/29/2019 0953   CHOLHDL 3.8 05/29/2019 0953   LDLCALC 98 05/29/2019 0953    Home fasting blood sugars: ***  2 hour post-meal/random blood sugars: ***.  Clinical Atherosclerotic Cardiovascular Disease (ASCVD): {YES/NO:21197} The 10-year ASCVD risk score (Arnett DK, et al., 2019) is: 17%   Values used to calculate the score:     Age: 55 years     Sex: Male     Is Non-Hispanic African American: Yes     Diabetic: Yes     Tobacco smoker: No     Systolic Blood Pressure: 121 mmHg     Is BP treated:  Yes     HDL Cholesterol: 44 mg/dL     Total Cholesterol: 165 mg/dL   PHQ-9 Score: ***  A/P: Diabetes longstanding*** currently ***. Patient is *** able to verbalize appropriate hypoglycemia management plan. Medication adherence appears ***. Control is suboptimal due to ***. -{Meds adjust:18428} basal insulin *** (insulin ***). Patient will continue to titrate 1 unit every *** days if fasting blood sugar > 100mg /dl until fasting blood sugars reach goal or next visit.  -{Meds adjust:18428}  rapid insulin *** (insulin ***) to ***.  -{Meds adjust:18428} GLP-1 *** (generic name***) to ***.  -{Meds adjust:18428} SGLT2-I *** (generic name***) to ***. Counseled on sick day rules for ***. -Extensively discussed pathophysiology of diabetes, recommended lifestyle interventions, dietary effects on blood sugar control -Counseled on s/sx of and management of hypoglycemia -Next A1C anticipated ***.   ASCVD risk - primary***secondary prevention in patient with diabetes. Last LDL {Is/is not:9024} controlled. ASCVD risk score {Is/is not:9024} >20%  - {Desc; low/moderate/high:110033} intensity statin indicated. Aspirin {Is/is not:9024} indicated.  -{Meds adjust:18428} aspirin *** mg  -{Meds adjust:18428} ***statin *** mg.   Hypertension longstanding*** currently ***.  Blood pressure goal = *** mmHg. Medication adherence ***.  Blood pressure control is suboptimal due to ***. -***  Written patient instructions provided.  Total time in face to face counseling *** minutes.   Follow up Pharmacist/PCP*** Clinic Visit in ***.   Patient seen with ***

## 2021-11-11 ENCOUNTER — Encounter: Payer: Self-pay | Admitting: Pharmacist

## 2021-11-11 ENCOUNTER — Ambulatory Visit: Payer: Self-pay | Admitting: Pharmacist

## 2021-12-09 ENCOUNTER — Telehealth: Payer: Self-pay | Admitting: Cardiology

## 2021-12-09 NOTE — Telephone Encounter (Signed)
[employer] called and wanted to verify if Dr. Percival Spanish will keep patient on disability or if patient is dependent on oxgyen to keep disability going.

## 2021-12-09 NOTE — Telephone Encounter (Signed)
Spoke with Edwin Mcclain, let her know we are waiting for Dr. Antoine Poche to answer back at this time. She verbalized understanding and thanked me for calling and letting her know. She made a note to make other staff aware.

## 2021-12-09 NOTE — Telephone Encounter (Signed)
Please call [employer] Rep Charlesetta Shanks at [phone] Ext. [phone]

## 2021-12-10 ENCOUNTER — Telehealth: Payer: Self-pay | Admitting: Cardiology

## 2021-12-10 NOTE — Telephone Encounter (Signed)
Spoke with pt, he is asking for long term disability but reports he was taken out of work by his medical doctor, letter is in his chart. Patient made aware from a cardiac standpoint he is not totally disabled. He reports he is on oxygen all the time and it was given to him by his medical doctor. He is going to call and see if the paperwork from his medical doctor is all they need and let me know.

## 2021-12-10 NOTE — Telephone Encounter (Signed)
Called pt back, states he received a message from [place] regarding some paperwork. Will forward to nurse.

## 2021-12-10 NOTE — Telephone Encounter (Signed)
Patient said he was returning a call from our office. He did not say who called

## 2021-12-10 NOTE — Telephone Encounter (Signed)
12/21 Disability forms put in Dr.Hochrein box.

## 2021-12-18 NOTE — Progress Notes (Signed)
Cardiology Clinic Note   Patient Name: Yoskar Murrillo Date of Encounter: 12/19/2021  Primary Care Provider:  [name], MD Primary Cardiologist:  Minus Breeding, MD  Patient Profile  55 year old African-American male with history of chronic diastolic heart failure, hyperlipidemia, history of acute respiratory failure, hypertension, OSA, type 2 diabetes, and super morbid obesity.    Past Medical History    Past Medical History:  Diagnosis Date   Acute diastolic HF (heart failure) ([place]) 02/08/2019   Acute respiratory failure with hypoxia ([place]) 02/08/2019   CHF (congestive heart failure) (HCC)    COPD (chronic obstructive pulmonary disease) (HCC)    HTN (hypertension)    Morbid obesity (HCC)    Past Surgical History:  Procedure Laterality Date   C-spine surgery      Allergies  Allergies  Allergen Reactions   Metformin And Related Diarrhea    History of Present Illness    Mr. Kempton is a 55 year old African-American male we are following for ongoing assessment and management of chronic diastolic heart failure and hypertension.  Last seen by Dr. Percival Spanish on 03/11/2021 with complaints of lower extremity edema.  Dr. Percival Spanish found to to be euvolemic with no evidence of significant volume overload.  Blood pressure was not optimal and therefore carvedilol was increased to 25 mg twice daily.  Has been seen by family practice physician on 10/30/2021 at which time blood pressure was 121/70.   He comes today without any cardiac complaints.  He has not yet taken his medications and therefore his blood pressure is elevated.  He does have an occasional cough with some phlegm in the lower part of his chest which comes out clear.  He denies any fever, chills or significant chest congestion.  He is doing his best to lose weight and has lost 28 pounds since June 2022.  He is slowly eliminating Pepsi's from his diet which were a lot of his problems concerning weight gain.  He is also working on  low-sodium diet and becoming more active.  His sister is a Designer, jewellery at [employer] who has been coaching him concerning becoming more active.  Home Medications    Current Outpatient Medications  Medication Sig Dispense Refill   aspirin 81 MG EC tablet Take 1 tablet (81 mg total) by mouth daily. 30 tablet    atorvastatin (LIPITOR) 10 MG tablet TAKE 1 TABLET(10 MG) BY MOUTH DAILY (Patient taking differently: Take 10 mg by mouth daily.) 90 tablet 0   Blood Glucose Monitoring Suppl (ACCU-CHEK AVIVA PLUS) w/Device KIT 1 Device by Does not apply route daily. 1 kit 0   carvedilol (COREG) 25 MG tablet Take 1 tablet (25 mg total) by mouth 2 (two) times daily with a meal. 60 tablet 6   glucose blood (ACCU-CHEK AVIVA PLUS) test strip Use as instructed 100 each 12   iron polysaccharides (NIFEREX) 150 MG capsule Take 150 mg by mouth daily.     Lancet Devices (ONE TOUCH DELICA LANCING DEV) MISC Use as directed. Dx: E10.9, E11.9 1 each 0   Lancets MISC 1 application by Does not apply route daily. 100 each 0   losartan (COZAAR) 25 MG tablet TAKE 1 TABLET(25 MG) BY MOUTH DAILY 90 tablet 0   Melatonin 10 MG TABS Take 1 tablet by mouth at bedtime as needed (sleep).     sitaGLIPtin (JANUVIA) 25 MG tablet Take 1 tablet (25 mg total) by mouth daily. 90 tablet 0   torsemide (DEMADEX) 20 MG tablet Take 2 tablets (40  mg total) by mouth daily. 60 tablet 11   vitamin B-12 1000 MCG tablet Take 1 tablet (1,000 mcg total) by mouth daily. 30 tablet 0   No current facility-administered medications for this visit.     Family History    Family History  Problem Relation Age of Onset   Cancer Mother        Patient is not sure which type of cancer   Leukemia Father    He indicated that his mother is alive. He indicated that his father is deceased.  Social History    Social History   Socioeconomic History   Marital status: Single    Spouse name: Not on file   Number of children: Not on file   Years  of education: Not on file   Highest education level: Not on file  Occupational History   Not on file  Tobacco Use   Smoking status: Never   Smokeless tobacco: Never  Vaping Use   Vaping Use: Never used  Substance and Sexual Activity   Alcohol use: Never   Drug use: Never   Sexual activity: Not on file  Other Topics Concern   Not on file  Social History Narrative   Lives with cousin for now.  Works at [employer].     Social Determinants of Health   Financial Resource Strain: Not on file  Food Insecurity: Not on file  Transportation Needs: Not on file  Physical Activity: Not on file  Stress: Not on file  Social Connections: Not on file  Intimate Partner Violence: Not on file     Review of Systems    General:  No chills, fever, night sweats or weight changes.  Cardiovascular:  No chest pain, dyspnea on exertion, edema, orthopnea, palpitations, paroxysmal nocturnal dyspnea. Dermatological: No rash, lesions/masses Respiratory: Positive for cough, no dyspnea Urologic: No hematuria, dysuria Abdominal:   No nausea, vomiting, diarrhea, bright red blood per rectum, melena, or hematemesis Neurologic:  No visual changes, wkns, changes in mental status. All other systems reviewed and are otherwise negative except as noted above.     Physical Exam    VS:  BP (!) 140/92 (BP Location: Left Arm, Patient Position: Sitting) Comment (Cuff Size): Thigh   Pulse 97    Ht '5\' 11"'  (1.803 m)    Wt (!) 379 lb (171.9 kg)    SpO2 92%    BMI 52.86 kg/m  , BMI Body mass index is 52.86 kg/m.     GEN: Well nourished, well developed, in no acute distress. HEENT: normal. Neck: Supple, no JVD, carotid bruits, or masses. Cardiac: RRR, no murmurs, rubs, or gallops. No clubbing, cyanosis, positive for woody edema bilateral.  Difficult to palpate DP/PT, but radial 2+ and equal bilaterally.  Respiratory:  Respirations regular and unlabored, clear to auscultation bilaterally.  Some crackles in the right base  without wheezes or rhonchi. GI: Soft, nontender, nondistended, BS + x 4. MS: no deformity or atrophy. Skin: warm and dry, no rash. Neuro:  Strength and sensation are intact. Psych: Normal affect.  Accessory Clinical Findings     Lab Results  Component Value Date   WBC 6.0 04/28/2021   HGB 12.1 (L) 04/28/2021   HCT 39.4 04/28/2021   MCV 77 (L) 04/28/2021   PLT 238 04/28/2021   Lab Results  Component Value Date   CREATININE 0.88 04/28/2021   BUN 9 04/28/2021   NA 141 04/28/2021   K 4.4 04/28/2021   CL 96 04/28/2021  CO2 28 04/28/2021   Lab Results  Component Value Date   ALT 21 04/28/2021   AST 18 04/28/2021   ALKPHOS 115 04/28/2021   BILITOT <0.2 04/28/2021   Lab Results  Component Value Date   CHOL 165 05/29/2019   HDL 44 05/29/2019   LDLCALC 98 05/29/2019   TRIG 113 05/29/2019   CHOLHDL 3.8 05/29/2019    Lab Results  Component Value Date   HGBA1C 7.7 (H) 04/01/2021    Review of Prior Studies: Echocardiogram 04/03/2021 1. Left ventricular ejection fraction, by estimation, is 55 to 60%. The  left ventricle has normal function. The left ventricle has no regional  wall motion abnormalities. Left ventricular diastolic parameters were  normal.   2. Right ventricular systolic function was not well visualized. The right  ventricular size is not well visualized. There is normal pulmonary artery  systolic pressure.   3. The mitral valve is normal in structure. No evidence of mitral valve  regurgitation. No evidence of mitral stenosis.   4. The aortic valve was not well visualized. Aortic valve regurgitation  is not visualized. No aortic stenosis is present.   5. Aortic dilatation noted. There is mild dilatation of the aortic root,  measuring 40 mm.   6. No complete TR doppler jet so unable to estimate PA systolic pressure.   Assessment & Plan   1.  Chronic diastolic CHF: No evidence of volume overload at this time.  He does have significant woody edema, but no  abdominal distention or ballottement.  He is taking torsemide 40 mg twice daily instead of 20 mg twice daily now.  He is still getting good response.  He is trying to eliminate salt from his diet as well as sugar.  His weight is down 28 pounds since June 2022.  May have to reevaluate he is "dry weight" in order to evaluate him for volume overload.  Currently I will not change any medications at this time.  He is to follow-up with a BMET however his PCP will be drawing it next month at usual follow-up appointment.  I have explained to him the importance of checking his kidney function on an ARB as well as a diuretic.  He verbalizes understanding.  2.  Hypertension: Blood pressure is elevated today compared to prior visits.  He has not yet taken his medication today.  He is normally very compliant.  We will continue to follow his blood pressures at subsequent visits.  No changes at this time.  3.  Hyperlipidemia: Currently on atorvastatin 10 mg daily.  Most recent labs drawn 05/29/2019 revealed a total cholesterol of 165 HDL 44 LDL 98 with triglycerides of 113.  These will need to be completed on his PCP visit.  If not available will draw them on next office visit.  4.  OSA: On CPAP and is compliant.  He states that he cannot lay on his back or stomach as this causes nightmares, but sleeping on his side is fine and the CPAP remains secure.  5.  Super morbid obesity: He is dedicated to losing weight and has made some progress losing 28 pounds since June 2022, majority of his weight loss has been between October 30, 2021 until this date.  He is eliminating Pepsi's from his diet.  He says it is hard and he is slowly removing them and watching his salt content.  He is trying to become more active walking up to 30 minutes a day which has been very difficult  for him because of his girth.  His sister is continue to help him and coach him along the way.  Current medicines are reviewed at length with the patient  today.  I have spent 25 min's  dedicated to the care of this patient on the date of this encounter to include pre-visit review of records, assessment, management and diagnostic testing,with shared decision making. Marland Kitchen. West Pugh, ANP, AACC   12/19/2021 2:42 PM    [facility].  I can talk now.  It is Raquel Sarna asked and telephonic patient actually that is when you talk to me about earlier [place] [phone] Fax [phone]  Notice: This dictation was prepared with Dragon dictation along with smaller phrase technology. Any transcriptional errors that result from this process are unintentional and may not be corrected upon review.

## 2021-12-19 ENCOUNTER — Other Ambulatory Visit: Payer: Self-pay

## 2021-12-19 ENCOUNTER — Ambulatory Visit (INDEPENDENT_AMBULATORY_CARE_PROVIDER_SITE_OTHER): Payer: Self-pay | Admitting: Adult Health

## 2021-12-19 ENCOUNTER — Encounter: Payer: Self-pay | Admitting: Adult Health

## 2021-12-19 VITALS — BP 140/92 | HR 97 | Ht 71.0 in | Wt 379.0 lb

## 2021-12-19 DIAGNOSIS — I5032 Chronic diastolic (congestive) heart failure: Secondary | ICD-10-CM

## 2021-12-19 DIAGNOSIS — I10 Essential (primary) hypertension: Secondary | ICD-10-CM

## 2021-12-19 DIAGNOSIS — G4730 Sleep apnea, unspecified: Secondary | ICD-10-CM

## 2021-12-19 MED ORDER — TORSEMIDE 20 MG PO TABS: 40.0000 mg | ORAL_TABLET | Freq: Every day | ORAL | 11 refills | Status: DC

## 2021-12-19 NOTE — Patient Instructions (Addendum)
Medication Instructions:  Take torsemide 40 mg (2 tablets) each day.  You may use Mucinex bought over the counter to help with phlegm.  *If you need a refill on your cardiac medications before your next appointment, please call your pharmacy*  Follow-Up: At [facility], you and your health needs are our priority.  As part of our continuing mission to provide you with exceptional heart care, we have created designated Provider Care Teams.  These Care Teams include your primary Cardiologist (physician) and Advanced Practice Providers (APPs -  Physician Assistants and Nurse Practitioners) who all work together to provide you with the care you need, when you need it.  We recommend signing up for the patient portal called "MyChart".  Sign up information is provided on this After Visit Summary.  MyChart is used to connect with patients for Virtual Visits (Telemedicine).  Patients are able to view lab/test results, encounter notes, upcoming appointments, etc.  Non-urgent messages can be sent to your provider as well.   To learn more about what you can do with MyChart, go to [email or web address].    Your next appointment:   June 19, 2022 at 10:00 am  The format for your next appointment:   In Person  Provider:   Rollene Rotunda, MD

## 2022-01-12 NOTE — Telephone Encounter (Signed)
Larissa from [place] calling back about the patients disability claim. Phone: [phone]

## 2022-01-12 NOTE — Telephone Encounter (Signed)
Spoke with [place] notified that she is to call medical provider regarding disability claim.

## 2022-01-19 ENCOUNTER — Ambulatory Visit (INDEPENDENT_AMBULATORY_CARE_PROVIDER_SITE_OTHER): Payer: Self-pay | Admitting: Family Medicine

## 2022-01-19 ENCOUNTER — Other Ambulatory Visit: Payer: Self-pay

## 2022-01-19 ENCOUNTER — Encounter (INDEPENDENT_AMBULATORY_CARE_PROVIDER_SITE_OTHER): Payer: Self-pay

## 2022-01-19 ENCOUNTER — Encounter: Payer: Self-pay | Admitting: Family Medicine

## 2022-01-19 VITALS — BP 132/86 | HR 96 | Temp 99.4°F | Resp 20 | Ht 71.0 in | Wt 390.0 lb

## 2022-01-19 DIAGNOSIS — Z0289 Encounter for other administrative examinations: Secondary | ICD-10-CM

## 2022-01-19 DIAGNOSIS — Z6841 Body Mass Index (BMI) 40.0 and over, adult: Secondary | ICD-10-CM

## 2022-01-19 DIAGNOSIS — I10 Essential (primary) hypertension: Secondary | ICD-10-CM

## 2022-01-19 DIAGNOSIS — E118 Type 2 diabetes mellitus with unspecified complications: Secondary | ICD-10-CM

## 2022-01-19 LAB — POCT GLYCOSYLATED HEMOGLOBIN (HGB A1C): HbA1c, POC (controlled diabetic range): 9.2 % — AB (ref 0.0–7.0)

## 2022-01-19 NOTE — Progress Notes (Signed)
FMLA forms

## 2022-01-19 NOTE — Progress Notes (Signed)
New Patient Office Visit  Subjective:  Patient ID: Edwin Mcclain, male    DOB: Mar 27, 1966  Age: 56 y.o. MRN: [id number]  CC:  Chief Complaint  Patient presents with   FMLA     HPI Edwin Mcclain presents for follow up of chronic med issues including diabetes and hypertension. Patient denies acute complaints or concerns.   Past Medical History:  Diagnosis Date   Acute diastolic HF (heart failure) ([place]) 02/08/2019   Acute respiratory failure with hypoxia ([place]) 02/08/2019   CHF (congestive heart failure) (HCC)    COPD (chronic obstructive pulmonary disease) (HCC)    HTN (hypertension)    Morbid obesity (HCC)     Past Surgical History:  Procedure Laterality Date   C-spine surgery      Family History  Problem Relation Age of Onset   Cancer Mother        Patient is not sure which type of cancer   Leukemia Father     Social History   Socioeconomic History   Marital status: Single    Spouse name: Not on file   Number of children: Not on file   Years of education: Not on file   Highest education level: Not on file  Occupational History   Not on file  Tobacco Use   Smoking status: Never   Smokeless tobacco: Never  Vaping Use   Vaping Use: Never used  Substance and Sexual Activity   Alcohol use: Never   Drug use: Never   Sexual activity: Not on file  Other Topics Concern   Not on file  Social History Narrative   Lives with cousin for now.  Works at [employer].     Social Determinants of Health   Financial Resource Strain: Not on file  Food Insecurity: Not on file  Transportation Needs: Not on file  Physical Activity: Not on file  Stress: Not on file  Social Connections: Not on file  Intimate Partner Violence: Not on file    ROS Review of Systems  All other systems reviewed and are negative.  Objective:   Today's Vitals: BP 132/86 (BP Location: Right Arm, Patient Position: Sitting, Cuff Size: Large)    Pulse 96    Temp 99.4 F (37.4 C) (Oral)    Resp 20    Ht  '5\' 11"'  (1.803 m)    Wt (!) 390 lb (176.9 kg)    SpO2 93%    BMI 54.39 kg/m   Physical Exam Vitals and nursing note reviewed.  Constitutional:      General: He is not in acute distress.    Appearance: He is obese.  Cardiovascular:     Rate and Rhythm: Normal rate and regular rhythm.  Pulmonary:     Effort: Pulmonary effort is normal.     Breath sounds: Normal breath sounds.  Abdominal:     Palpations: Abdomen is soft.     Tenderness: There is no abdominal tenderness.  Musculoskeletal:     Right lower leg: No edema.     Left lower leg: No edema.  Neurological:     General: No focal deficit present.     Mental Status: He is alert and oriented to person, place, and time.    Assessment & Plan:   1. Type 2 diabetes mellitus with complication, without long-term current use of insulin (HCC) Increased reading. Patient to [facility] up with [facility] for diabetic med management.  - HgB A1c  2. Essential hypertension Appears stable with present management .  Continue   3. Class 3 severe obesity due to excess calories with serious comorbidity and body mass index (BMI) of 50.0 to 59.9 in adult [facility]) Discussed dietary and activity options.   4. Encounter for completion of form with patient Form completed for patient   Outpatient Encounter Medications as of 01/19/2022  Medication Sig   aspirin 81 MG EC tablet Take 1 tablet (81 mg total) by mouth daily.   atorvastatin (LIPITOR) 10 MG tablet TAKE 1 TABLET(10 MG) BY MOUTH DAILY (Patient taking differently: Take 10 mg by mouth daily.)   carvedilol (COREG) 25 MG tablet Take 1 tablet (25 mg total) by mouth 2 (two) times daily with a meal.   iron polysaccharides (NIFEREX) 150 MG capsule Take 150 mg by mouth daily.   losartan (COZAAR) 25 MG tablet TAKE 1 TABLET(25 MG) BY MOUTH DAILY   Melatonin 10 MG TABS Take 1 tablet by mouth at bedtime as needed (sleep).   sitaGLIPtin (JANUVIA) 25 MG tablet Take 1 tablet (25 mg total) by mouth daily.   torsemide  (DEMADEX) 20 MG tablet Take 2 tablets (40 mg total) by mouth daily.   vitamin B-12 1000 MCG tablet Take 1 tablet (1,000 mcg total) by mouth daily.   Blood Glucose Monitoring Suppl (ACCU-CHEK AVIVA PLUS) w/Device KIT 1 Device by Does not apply route daily.   glucose blood (ACCU-CHEK AVIVA PLUS) test strip Use as instructed   Lancet Devices (ONE TOUCH DELICA LANCING DEV) MISC Use as directed. Dx: E10.9, E11.9   Lancets MISC 1 application by Does not apply route daily.   No facility-administered encounter medications on file as of 01/19/2022.    Follow-up: No follow-ups on file.   Edwin Sax, MD

## 2022-01-21 ENCOUNTER — Encounter: Payer: Self-pay | Admitting: Family Medicine

## 2022-01-23 ENCOUNTER — Telehealth: Payer: Self-pay | Admitting: Family Medicine

## 2022-01-23 NOTE — Telephone Encounter (Signed)
Spoke w/ [employer] and said the Paperwork they received was blank and is asking if it could be resent please.

## 2022-01-23 NOTE — Telephone Encounter (Signed)
This has been resent Mr. Fantini

## 2022-01-27 ENCOUNTER — Ambulatory Visit: Payer: Self-pay | Admitting: Pharmacist

## 2022-01-30 ENCOUNTER — Ambulatory Visit: Payer: Self-pay | Admitting: Family Medicine

## 2022-03-27 NOTE — Progress Notes (Signed)
? ? ?S:    ?Edwin Mcclain is a 56 y.o. male who presents for diabetes evaluation, education, and management. PMH is significant for T2DM, HTN, HF, COPD, obesity. Patient was referred and last seen by Primary Care Provider, Dr. Andrey Campanile, on 01/19/22. He no showed Luke on 01/27/22.  ? ?Today, patient arrives in good spirits and presents without assistance. He has a history of GI intolerance to metformin (diarrhea). He has not tried any other medications for DM besides Januvia. He has expired prescriptions for multiple medications and no recent dispenses per fill history but endorses compliance with all medications.  ? ?Family/Social History: Never smoker.  ? ?Current diabetes medications include: Januvia 25 mg daily ?Current hypertension medications include: losartan 25 mg daily, carvedilol 25 mg BID, torsemide 40 mg daily ?Current hyperlipidemia medications include: atorvastatin 10 mg  ? ?Patient reports taking all medications as prescribed. Patient reports adherence with medications. Patient reports missing his medications 2 times per week, on average. ? ?Do you feel that your medications are working for you? yes ?Have you been experiencing any side effects to the medications prescribed? no ?Do you have any problems obtaining medications due to transportation or finances? Yes - transportation to the pharmacy is a barrier to getting his medications.  ?Insurance coverage: Chartered loss adjuster ? ?Patient denies hypoglycemic events. ? ?Reported home fasting blood sugars: does not check  ?Reported 2 hour post-meal/random blood sugars: does not check ? ?Patient reports nocturia (nighttime urination). 2x/night.  ?Patient denies neuropathy (nerve pain). ?Patient denies visual changes. ?Patient denies self foot exams. Needs to make podiatry appt.  ? ?Patient reported dietary habits: Eats 2-3 meals/day. Cooks for himself. Likes salads, baked chicken. Weak spot is pasta, Pepsi (4 per day now, used to be 16 per day). Reports  doing better with snacking.  ? ?Within the past 12 months, did you worry whether your food would run out before you got money to buy more? no ?Within the past 12 months, did the food you bought run out, and you didn?t have money to get more? no ? ?Patient-reported exercise habits: walking throughout the day ? ? ?O:  ?7 day average blood glucose: does not check at home, does not have a meter at home ? ?Lab Results  ?Component Value Date  ? HGBA1C 9.2 (A) 01/19/2022  ? ?There were no vitals filed for this visit. ? ?Lipid Panel  ?   ?Component Value Date/Time  ? CHOL 165 05/29/2019 0953  ? TRIG 113 05/29/2019 0953  ? HDL 44 05/29/2019 0953  ? CHOLHDL 3.8 05/29/2019 0953  ? LDLCALC 98 05/29/2019 0953  ? ? ?Clinical Atherosclerotic Cardiovascular Disease (ASCVD): No  ?The 10-year ASCVD risk score (Arnett DK, et al., 2019) is: 19.8% ?  Values used to calculate the score: ?    Age: 79 years ?    Sex: Male ?    Is Non-Hispanic African American: Yes ?    Diabetic: Yes ?    Tobacco smoker: No ?    Systolic Blood Pressure: 132 mmHg ?    Is BP treated: Yes ?    HDL Cholesterol: 44 mg/dL ?    Total Cholesterol: 165 mg/dL  ? ? ?A/P: ?Diabetes longstanding currently uncontrolled with last A1c 9.2 (01/19/22). Patient is able to verbalize appropriate hypoglycemia management plan. Medication non-adherence is suspected due to patient's unfamiliarity with the names of his medications. Discussed transportation barrier to accessing medications. He would like to switch all of his prescriptions to [facility] for home delivery. Confirmed that his home address and phone number are correct. He would benefit from an SGLT2i given T2DM and HF. If A1c still elevated after adherence with this regimen, would recommend consideration of a GLP-1 RA.  ?-Start Farxiga 10 mg daily.  ?-Continue Januvia 25 mg daily.  ?-Patient educated on purpose, proper use, and potential adverse effects of [place].  ?-Extensively discussed  pathophysiology of diabetes, recommended lifestyle interventions, dietary effects on blood sugar control.  ?-Counseled on s/sx of and management of hypoglycemia.  ?-Next A1c anticipated at next PCP visit.  ? ?Written patient instructions provided. Patient verbalized understanding of treatment plan. Total time in face to face counseling 26 minutes.   ? ?Follow up PCP clinic visit in 1 month. ? ?Pervis Hocking, PharmD ?PGY2 Ambulatory Care Pharmacy Resident ?03/30/2022 2:11 PM ?  ?

## 2022-03-30 ENCOUNTER — Other Ambulatory Visit: Payer: Self-pay | Admitting: Pharmacist

## 2022-03-30 ENCOUNTER — Other Ambulatory Visit (HOSPITAL_COMMUNITY): Payer: Self-pay

## 2022-03-30 ENCOUNTER — Encounter: Payer: Self-pay | Admitting: Pharmacist

## 2022-03-30 ENCOUNTER — Ambulatory Visit: Payer: Self-pay | Attending: Family Medicine | Admitting: Pharmacist

## 2022-03-30 DIAGNOSIS — I10 Essential (primary) hypertension: Secondary | ICD-10-CM

## 2022-03-30 DIAGNOSIS — E118 Type 2 diabetes mellitus with unspecified complications: Secondary | ICD-10-CM

## 2022-03-30 MED ORDER — LOSARTAN POTASSIUM 25 MG PO TABS
ORAL_TABLET | ORAL | 0 refills | Status: DC
  Filled 2022-03-30: qty 90, 90d supply, fill #0

## 2022-03-30 MED ORDER — CARVEDILOL 25 MG PO TABS
25.0000 mg | ORAL_TABLET | Freq: Two times a day (BID) | ORAL | 1 refills | Status: DC
  Filled 2022-03-30: qty 180, 90d supply, fill #0
  Filled 2022-09-21: qty 180, 90d supply, fill #1

## 2022-03-30 MED ORDER — ACCU-CHEK SOFTCLIX LANCETS MISC
2 refills | Status: AC
  Filled 2022-03-30: qty 100, 90d supply, fill #0

## 2022-03-30 MED ORDER — ATORVASTATIN CALCIUM 10 MG PO TABS
10.0000 mg | ORAL_TABLET | Freq: Every day | ORAL | 1 refills | Status: DC
  Filled 2022-03-30: qty 90, 90d supply, fill #0
  Filled 2022-09-21: qty 90, 90d supply, fill #1

## 2022-03-30 MED ORDER — ACCU-CHEK GUIDE W/DEVICE KIT
PACK | 0 refills | Status: DC
  Filled 2022-03-30: qty 1, 90d supply, fill #0

## 2022-03-30 MED ORDER — DAPAGLIFLOZIN PROPANEDIOL 10 MG PO TABS
10.0000 mg | ORAL_TABLET | Freq: Every day | ORAL | 2 refills | Status: DC
  Filled 2022-03-30: qty 30, 30d supply, fill #0

## 2022-03-30 MED ORDER — TORSEMIDE 20 MG PO TABS
40.0000 mg | ORAL_TABLET | Freq: Every day | ORAL | 1 refills | Status: DC
  Filled 2022-03-30: qty 180, 90d supply, fill #0

## 2022-03-30 MED ORDER — ACCU-CHEK GUIDE VI STRP
ORAL_STRIP | 2 refills | Status: AC
  Filled 2022-03-30: qty 50, 50d supply, fill #0

## 2022-03-31 ENCOUNTER — Other Ambulatory Visit (HOSPITAL_COMMUNITY): Payer: Self-pay

## 2022-04-09 ENCOUNTER — Other Ambulatory Visit (HOSPITAL_COMMUNITY): Payer: Self-pay

## 2022-04-14 ENCOUNTER — Ambulatory Visit: Payer: Self-pay | Admitting: Family Medicine

## 2022-04-14 IMAGING — DX DG CHEST 1V PORT
1 series · 1 of 1 positions shown · non-contrast
Comparison: 02/07/2019

CLINICAL DATA: Chest pain, short of breath

EXAM:
PORTABLE CHEST 1 VIEW

[chest ap]
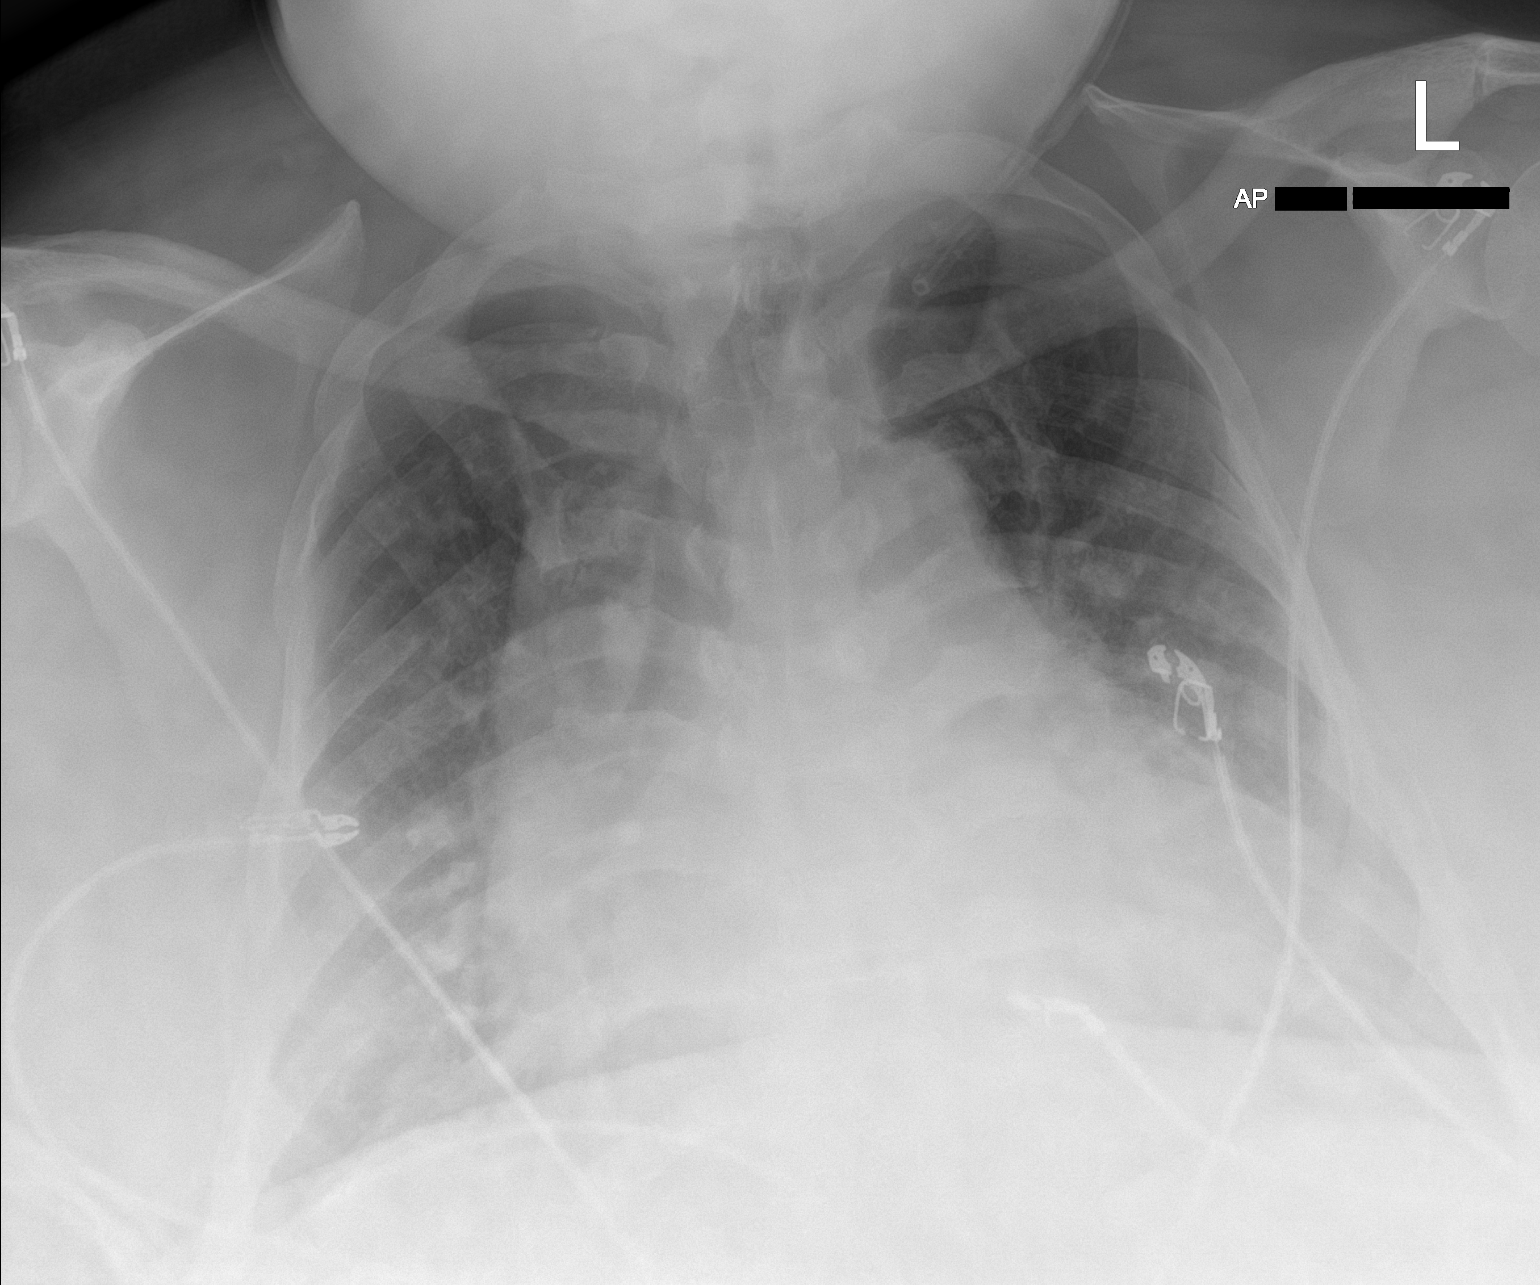

[1 of 1 positions shown; findings below may reference images not displayed]

FINDINGS: Single frontal view of the chest demonstrates chronic enlargement
the cardiac silhouette. There is central vascular congestion without
airspace disease, effusion, or pneumothorax. No acute bony
abnormalities.
IMPRESSION: 1. Chronic enlargement the cardiac silhouette.
2. Pulmonary vascular congestion without overt edema.

## 2022-04-28 ENCOUNTER — Encounter: Payer: Self-pay | Admitting: Family Medicine

## 2022-04-28 ENCOUNTER — Ambulatory Visit (INDEPENDENT_AMBULATORY_CARE_PROVIDER_SITE_OTHER): Payer: Commercial Managed Care - HMO | Admitting: Family Medicine

## 2022-04-28 VITALS — BP 125/78 | HR 80 | Temp 98.1°F | Resp 18 | Ht 71.0 in | Wt >= 6400 oz

## 2022-04-28 DIAGNOSIS — Z23 Encounter for immunization: Secondary | ICD-10-CM | POA: Diagnosis not present

## 2022-04-28 DIAGNOSIS — E785 Hyperlipidemia, unspecified: Secondary | ICD-10-CM | POA: Diagnosis not present

## 2022-04-28 DIAGNOSIS — Z6841 Body Mass Index (BMI) 40.0 and over, adult: Secondary | ICD-10-CM

## 2022-04-28 DIAGNOSIS — E1169 Type 2 diabetes mellitus with other specified complication: Secondary | ICD-10-CM

## 2022-04-28 DIAGNOSIS — Z1211 Encounter for screening for malignant neoplasm of colon: Secondary | ICD-10-CM

## 2022-04-28 DIAGNOSIS — I10 Essential (primary) hypertension: Secondary | ICD-10-CM | POA: Diagnosis not present

## 2022-04-28 DIAGNOSIS — E66813 Obesity, class 3: Secondary | ICD-10-CM

## 2022-04-28 DIAGNOSIS — E1165 Type 2 diabetes mellitus with hyperglycemia: Secondary | ICD-10-CM

## 2022-04-28 LAB — POCT GLYCOSYLATED HEMOGLOBIN (HGB A1C): Hemoglobin A1C: 9.8 % — AB (ref 4.0–5.6)

## 2022-04-28 NOTE — Progress Notes (Signed)
? ?Established Patient Office Visit ? ?Subjective   ? ?Patient ID: Edwin Mcclain, male    DOB: Sep 25, 1966  Age: 56 y.o. MRN: [id number] ? ?CC: No chief complaint on file. ? ? ?HPI ?Edwin Mcclain presents for routine follow up of chronic med issues including diabetes and hypertension. Patient denies acute complaints or concerns.  ? ? ?Outpatient Encounter Medications as of 04/28/2022  ?Medication Sig  ? Accu-Chek Softclix Lancets lancets Use to check blood sugar once daily.  ? aspirin 81 MG EC tablet Take 1 tablet (81 mg total) by mouth daily.  ? atorvastatin (LIPITOR) 10 MG tablet Take 1 tablet (10 mg total) by mouth daily.  ? Blood Glucose Monitoring Suppl (ACCU-CHEK GUIDE) w/Device KIT Use to check blood sugar once daily.  ? carvedilol (COREG) 25 MG tablet Take 1 tablet (25 mg total) by mouth 2 (two) times daily with a meal.  ? dapagliflozin propanediol (FARXIGA) 10 MG TABS tablet Take 1 tablet (10 mg total) by mouth daily before breakfast.  ? glucose blood (ACCU-CHEK GUIDE) test strip Use to check blood sugar once daily.  ? iron polysaccharides (NIFEREX) 150 MG capsule Take 150 mg by mouth daily.  ? Lancet Devices (ONE TOUCH DELICA LANCING DEV) MISC Use as directed. Dx: E10.9, E11.9  ? losartan (COZAAR) 25 MG tablet TAKE 1 TABLET(25 MG) BY MOUTH DAILY  ? Melatonin 10 MG TABS Take 1 tablet by mouth at bedtime as needed (sleep).  ? sitaGLIPtin (JANUVIA) 25 MG tablet Take 1 tablet (25 mg total) by mouth daily.  ? torsemide (DEMADEX) 20 MG tablet Take 2 tablets (40 mg total) by mouth daily.  ? vitamin B-12 1000 MCG tablet Take 1 tablet (1,000 mcg total) by mouth daily.  ? ?No facility-administered encounter medications on file as of 04/28/2022.  ? ? ?Past Medical History:  ?Diagnosis Date  ? Acute diastolic HF (heart failure) ([place]) 02/08/2019  ? Acute respiratory failure with hypoxia ([place]) 02/08/2019  ? CHF (congestive heart failure) ([place])   ? COPD (chronic obstructive pulmonary disease) ([place])   ? HTN (hypertension)   ?  Morbid obesity ([place])   ? ? ?Past Surgical History:  ?Procedure Laterality Date  ? C-spine surgery    ? ? ?Family History  ?Problem Relation Age of Onset  ? Cancer Mother   ?     Patient is not sure which type of cancer  ? Leukemia Father   ? ? ?Social History  ? ?Socioeconomic History  ? Marital status: Single  ?  Spouse name: Not on file  ? Number of children: Not on file  ? Years of education: Not on file  ? Highest education level: Not on file  ?Occupational History  ? Not on file  ?Tobacco Use  ? Smoking status: Never  ? Smokeless tobacco: Never  ?Vaping Use  ? Vaping Use: Never used  ?Substance and Sexual Activity  ? Alcohol use: Never  ? Drug use: Never  ? Sexual activity: Not on file  ?Other Topics Concern  ? Not on file  ?Social History Narrative  ? Lives with cousin for now.  Works at [employer].    ? ?Social Determinants of Health  ? ?Financial Resource Strain: Not on file  ?Food Insecurity: Not on file  ?Transportation Needs: Not on file  ?Physical Activity: Not on file  ?Stress: Not on file  ?Social Connections: Not on file  ?Intimate Partner Violence: Not on file  ? ? ?Review of Systems  ?All other systems reviewed and  are negative. ? ?  ? ? ?Objective   ? ?BP 125/78   Pulse 80   Temp 98.1 ?F (36.7 ?C) (Oral)   Resp 18   Ht _0  (1.803 m)   Wt (!) 400 lb (181.4 kg)   SpO2 (!) 86%   BMI 55.79 kg/m?  ? ?Physical Exam ?Vitals and nursing note reviewed.  ?Constitutional:   ?   General: He is not in acute distress. ?   Appearance: He is obese.  ?Cardiovascular:  ?   Rate and Rhythm: Normal rate and regular rhythm.  ?Pulmonary:  ?   Effort: Pulmonary effort is normal.  ?   Breath sounds: Normal breath sounds.  ?Abdominal:  ?   Palpations: Abdomen is soft.  ?   Tenderness: There is no abdominal tenderness.  ?Musculoskeletal:  ?   Right lower leg: No edema.  ?   Left lower leg: No edema.  ?Neurological:  ?   General: No focal deficit present.  ?   Mental Status: He is alert and oriented to person, place, and  time.  ? ? ? ?  ? ?Assessment & Plan:  ? ?1. Type 2 diabetes mellitus with hyperglycemia, without long-term current use of insulin ([place]) ?Increasing A1c and not at goal. Will refer to [facility] for diabetic med management.  ?- POCT glycosylated hemoglobin (Hb A1C) ? ?2. Essential hypertension ?Appears stable with present management. Continue  ? ?3. Hyperlipidemia associated with type 2 diabetes mellitus ([place]) ?Continue present management.  ? ?4. Class 3 severe obesity due to excess calories with serious comorbidity and body mass index (BMI) of 50.0 to 59.9 in adult [facility]) ?Discussed dietary and activity options.  ? ?5. Need for shingles vaccine ? ?- Varicella-zoster vaccine IM ? ? ? ? ?No follow-ups on file.  ? ?Becky Sax, MD ? ? ?

## 2022-04-30 ENCOUNTER — Encounter: Payer: Self-pay | Admitting: Family Medicine

## 2022-05-26 ENCOUNTER — Ambulatory Visit: Payer: Commercial Managed Care - HMO

## 2022-05-28 ENCOUNTER — Ambulatory Visit: Payer: Commercial Managed Care - HMO

## 2022-06-02 ENCOUNTER — Ambulatory Visit: Payer: Commercial Managed Care - HMO | Admitting: Pharmacist

## 2022-06-19 ENCOUNTER — Ambulatory Visit: Payer: Self-pay | Admitting: Cardiology

## 2022-07-03 NOTE — Progress Notes (Unsigned)
Cardiology Clinic Note   Patient Name: Dahl Higinbotham Date of Encounter: 07/06/2022  Primary Care Provider:  [name], MD Primary Cardiologist:  Minus Breeding, MD  Patient Profile    Breyton Vanscyoc 56 year old male presents the clinic today for follow-up evaluation of his acute on chronic diastolic CHF and essential hypertension.  Past Medical History    Past Medical History:  Diagnosis Date   Acute diastolic HF (heart failure) ([facility]) 02/08/2019   Acute respiratory failure with hypoxia ([facility]) 02/08/2019   CHF (congestive heart failure) ([facility])    COPD (chronic obstructive pulmonary disease) ([facility])    HTN (hypertension)    Morbid obesity ([facility])    Past Surgical History:  Procedure Laterality Date   C-spine surgery      Allergies  Allergies  Allergen Reactions   Metformin And Related Diarrhea    History of Present Illness    Shayne Burling has a PMH of HTN, chronic diastolic CHF, acute COPD exacerbation, type 2 diabetes, AKI, morbid obesity, anemia, morbid obesity and shortness of breath.  He was seen by Dr. Percival Spanish on 03/11/2021.  He complained of lower extremity edema.  Dr. Percival Spanish found him to be euvolemic with no evidence of significant fluid volume overload.  His blood pressure was not optimal and his carvedilol was increased to 25 mg twice daily.  He was seen by his PCP 10/30/2021 and his blood pressure was 121 over 70s.  He was seen in follow-up by Bunnie Domino, DNP on 12/19/2021.  During that time he had no cardiac complaints.  He had not yet taken his medications that day.  His blood pressure was elevated.  He did note occasional cough with some phlegm.  He denied fever chills and significant chest congestion.  He was doing well with weight loss and lost around 28 pounds.  He was trying to eliminate soft drinks.  He was working on more strict low-sodium diet and increasing physical activity.  He reported that his sister is a Designer, jewellery at [facility]  and had been coaching him to become more active.  He presents to the clinic today for follow-up evaluation states he feels fairly well.  He presents to the clinic today with 3 L of oxygen nasal cannula.  He reports that he was ordered to start this due to his respiratory status.  He is saturating in the 95% range today.  He does not need to increase his oxygen with increased physical activity.  He reports that he is disappointed due to his weight increase back to 400 pounds.  He is working on reducing his soft drinks.  He is now drinking around 4 Pepsi cans of per day.  He feels this is a significant improvement from his previous consumption.  We reviewed the importance of heart healthy diet.  We reviewed his previous echocardiogram he expressed understanding.  I will order a CBC, BMP, repeat echocardiogram and plan follow-up in 6 months.  Today he denies chest pain, increased shortness of breath, lower extremity edema, fatigue, palpitations, melena, hematuria, hemoptysis, diaphoresis, weakness, presyncope, syncope, orthopnea, and PND.   Home Medications    Prior to Admission medications   Medication Sig Start Date End Date Taking? Authorizing Provider  Accu-Chek Softclix Lancets lancets Use to check blood sugar once daily. 03/30/22   Charlott Rakes, MD  aspirin 81 MG EC tablet Take 1 tablet (81 mg total) by mouth daily. 12/12/19   Argentina Donovan, PA-C  atorvastatin (LIPITOR) 10 MG tablet Take  1 tablet (10 mg total) by mouth daily. 03/30/22   Charlott Rakes, MD  Blood Glucose Monitoring Suppl (ACCU-CHEK GUIDE) w/Device KIT Use to check blood sugar once daily. 03/30/22   Charlott Rakes, MD  carvedilol (COREG) 25 MG tablet Take 1 tablet (25 mg total) by mouth 2 (two) times daily with a meal. 03/30/22   Charlott Rakes, MD  dapagliflozin propanediol (FARXIGA) 10 MG TABS tablet Take 1 tablet (10 mg total) by mouth daily before breakfast. 03/30/22   Charlott Rakes, MD  glucose blood (ACCU-CHEK GUIDE)  test strip Use to check blood sugar once daily. 03/30/22   Charlott Rakes, MD  iron polysaccharides (NIFEREX) 150 MG capsule Take 150 mg by mouth daily. 07/18/19   [provider]  Lancet Devices (ONE TOUCH DELICA LANCING DEV) MISC Use as directed. Dx: E10.9, E11.9 10/17/19   Ladell Pier, MD  losartan (COZAAR) 25 MG tablet TAKE 1 TABLET(25 MG) BY MOUTH DAILY 03/30/22   Charlott Rakes, MD  Melatonin 10 MG TABS Take 1 tablet by mouth at bedtime as needed (sleep).    [provider]  sitaGLIPtin (JANUVIA) 25 MG tablet Take 1 tablet (25 mg total) by mouth daily. 04/29/21   Camillia Herter, NP  torsemide (DEMADEX) 20 MG tablet Take 2 tablets (40 mg total) by mouth daily. 03/30/22   Charlott Rakes, MD  vitamin B-12 1000 MCG tablet Take 1 tablet (1,000 mcg total) by mouth daily. 04/05/21   Ghimire, Henreitta Leber, MD    Family History    Family History  Problem Relation Age of Onset   Cancer Mother        Patient is not sure which type of cancer   Leukemia Father    He indicated that his mother is alive. He indicated that his father is deceased.  Social History    Social History   Socioeconomic History   Marital status: Single    Spouse name: Not on file   Number of children: Not on file   Years of education: Not on file   Highest education level: Not on file  Occupational History   Not on file  Tobacco Use   Smoking status: Never   Smokeless tobacco: Never  Vaping Use   Vaping Use: Never used  Substance and Sexual Activity   Alcohol use: Never   Drug use: Never   Sexual activity: Not on file  Other Topics Concern   Not on file  Social History Narrative   Lives with cousin for now.  Works at [employer].     Social Determinants of Health   Financial Resource Strain: Not on file  Food Insecurity: Not on file  Transportation Needs: Not on file  Physical Activity: Not on file  Stress: Not on file  Social Connections: Not on file  Intimate Partner Violence: Not on  file     Review of Systems    General:  No chills, fever, night sweats or weight changes.  Cardiovascular:  No chest pain, dyspnea on exertion, bilateral lower extremity woody edema, orthopnea, palpitations, paroxysmal nocturnal dyspnea. Dermatological: No rash, lesions/masses Respiratory: No cough, dyspnea Urologic: No hematuria, dysuria Abdominal:   No nausea, vomiting, diarrhea, bright red blood per rectum, melena, or hematemesis Neurologic:  No visual changes, wkns, changes in mental status. All other systems reviewed and are otherwise negative except as noted above.  Physical Exam    VS:  BP 114/73   Pulse 78   Ht '5\' 11"'  (1.803 m)  Wt (!) 400 lb 12.8 oz (181.8 kg)   SpO2 95% Comment: on 73m oxygen  BMI 55.90 kg/m  , BMI Body mass index is 55.9 kg/m. GEN: Well nourished, well developed, in no acute distress. HEENT: normal. Neck: Supple, no JVD, carotid bruits, or masses. Cardiac: RRR, no murmurs, rubs, or gallops. No clubbing, cyanosis, edema.  Radials/DP/PT 2+ and equal bilaterally.  Respiratory:  Respirations regular and unlabored, clear to auscultation bilaterally. GI: Soft, nontender, nondistended, BS + x 4. MS: no deformity or atrophy. Skin: warm and dry, no rash. Neuro:  Strength and sensation are intact. Psych: Normal affect.  Accessory Clinical Findings    Recent Labs: No results found for requested labs within last 365 days.   Recent Lipid Panel    Component Value Date/Time   CHOL 165 05/29/2019 0953   TRIG 113 05/29/2019 0953   HDL 44 05/29/2019 0953   CHOLHDL 3.8 05/29/2019 0953   LDLCALC 98 05/29/2019 0953    ECG personally reviewed by me today-none today.  Echocardiogram 04/03/2021  IMPRESSIONS     1. Left ventricular ejection fraction, by estimation, is 55 to 60%. The  left ventricle has normal function. The left ventricle has no regional  wall motion abnormalities. Left ventricular diastolic parameters were  normal.   2. Right  ventricular systolic function was not well visualized. The right  ventricular size is not well visualized. There is normal pulmonary artery  systolic pressure.   3. The mitral valve is normal in structure. No evidence of mitral valve  regurgitation. No evidence of mitral stenosis.   4. The aortic valve was not well visualized. Aortic valve regurgitation  is not visualized. No aortic stenosis is present.   5. Aortic dilatation noted. There is mild dilatation of the aortic root,  measuring 40 mm.   6. No complete TR doppler jet so unable to estimate PA systolic pressure.   FINDINGS   Left Ventricle: Left ventricular ejection fraction, by estimation, is 55  to 60%. The left ventricle has normal function. The left ventricle has no  regional wall motion abnormalities. The left ventricular internal cavity  size was normal in size. There is   no left ventricular hypertrophy. Left ventricular diastolic parameters  were normal.   Right Ventricle: The right ventricular size is not well visualized. Right  vetricular wall thickness was not well visualized. Right ventricular  systolic function was not well visualized. There is normal pulmonary  artery systolic pressure. The tricuspid  regurgitant velocity is 2.58 m/s, and with an assumed right atrial  pressure of 3 mmHg, the estimated right ventricular systolic pressure is  209.2mmHg.   Left Atrium: Left atrial size was normal in size.   Right Atrium: Right atrial size was not well visualized.   Pericardium: Trivial pericardial effusion is present.   Mitral Valve: The mitral valve is normal in structure. Mild mitral annular  calcification. No evidence of mitral valve regurgitation. No evidence of  mitral valve stenosis.   Tricuspid Valve: The tricuspid valve is normal in structure. Tricuspid  valve regurgitation is trivial.   Aortic Valve: The aortic valve was not well visualized. Aortic valve  regurgitation is not visualized. No aortic  stenosis is present. Aortic  valve mean gradient measures 8.0 mmHg. Aortic valve peak gradient measures  13.1 mmHg. Aortic valve area, by VTI  measures 4.97 cm.   Pulmonic Valve: The pulmonic valve was not well visualized. Pulmonic valve  regurgitation is not visualized.   Aorta: Aortic dilatation  noted. There is mild dilatation of the aortic  root, measuring 40 mm.   Assessment & Plan   1.  Diastolic CHF-no increased DOE or activity intolerance.  Weight today 400 pounds.  Euvolemic today.  Bilateral lower extremity woody edema. Continue carvedilol, Farxiga, losartan, torsemide Heart healthy low-sodium diet-salty 6 given Increase physical activity as tolerated BMP, CBC,Echo  Essential hypertension-BP today 114/73.  Well-controlled at home. Continue carvedilol, losartan Heart healthy low-sodium diet-salty 6 given Increase physical activity as tolerated  Obstructive sleep apnea-reports compliance with CPAP.  Waking up well rested. Continue CPAP use. Continue weight loss Avoid sedative and EtOH before bed Reviewed sleeping positions  Aortic root dilation-denies episodes of back discomfort.  Echocardiogram 04/03/2021 showed aortic root measuring 40 mm. Repeat echocardiogram  Morbid obesity-weight today 400.8 .  Continues to be conscious of diet and increasing physical activity.  Disappointed in weight gain. Continue weight loss Continue heart healthy low-sodium diet  Follow-up with Dr. Percival Spanish or me in 6 months.   Jossie Ng. Mandel Seiden NP-C     07/06/2022, 3:58 PM [address] Office [phone] Fax [phone]  Notice: This dictation was prepared with Dragon dictation along with smaller phrase technology. Any transcriptional errors that result from this process are unintentional and may not be corrected upon review.  I spent 13 minutes examining this patient, reviewing medications, and using patient centered shared  decision making involving her cardiac care.  Prior to her visit I spent greater than 20 minutes reviewing her past medical history,  medications, and prior cardiac tests.

## 2022-07-06 ENCOUNTER — Ambulatory Visit (INDEPENDENT_AMBULATORY_CARE_PROVIDER_SITE_OTHER): Payer: Commercial Managed Care - HMO | Admitting: General Practice

## 2022-07-06 ENCOUNTER — Encounter: Payer: Self-pay | Admitting: General Practice

## 2022-07-06 VITALS — BP 114/73 | HR 78 | Ht 71.0 in | Wt >= 6400 oz

## 2022-07-06 DIAGNOSIS — I5032 Chronic diastolic (congestive) heart failure: Secondary | ICD-10-CM

## 2022-07-06 DIAGNOSIS — G4733 Obstructive sleep apnea (adult) (pediatric): Secondary | ICD-10-CM | POA: Diagnosis not present

## 2022-07-06 DIAGNOSIS — I10 Essential (primary) hypertension: Secondary | ICD-10-CM | POA: Diagnosis not present

## 2022-07-06 NOTE — Patient Instructions (Signed)
Medication Instructions:  The current medical regimen is effective;  continue present plan and medications as directed. Please refer to the Current Medication list given to you today.   *If you need a refill on your cardiac medications before your next appointment, please call your pharmacy*  Lab Work:    CBC AND BMET TODAY      If you have labs (blood work) drawn today and your tests are completely normal, you will receive your results only by: MyChart Message (if you have MyChart) OR  A paper copy in the mail If you have any lab test that is abnormal or we need to change your treatment, we will call you to review the results.  Testing/Procedures:  Echocardiogram - Your physician has requested that you have an echocardiogram. Echocardiography is a painless test that uses sound waves to create images of your heart. It provides your doctor with information about the size and shape of your heart and how well your heart's chambers and valves are working. This procedure takes approximately one hour. There are no restrictions for this procedure.    Special Instructions PLEASE READ AND FOLLOW SALTY 6-ATTACHED-1,800mg  daily  PLEASE INCREASE PHYSICAL ACTIVITY AS TOLERATED   Follow-Up: Your next appointment:  6 month(s) In Person with Rollene Rotunda, MD   Please call our office 2 months in advance to schedule this appointment  :1  At [facility] you and your health needs are our priority.  As part of our continuing mission to provide you with exceptional heart care, we have created designated Provider Care Teams.  These Care Teams include your primary Cardiologist (physician) and Advanced Practice Providers (APPs -  Physician Assistants and Nurse Practitioners) who all work together to provide you with the care you need, when you need it.  We recommend signing up for the patient portal called "MyChart".  Sign up information is provided on this After Visit Summary.  MyChart is used to connect  with patients for Virtual Visits (Telemedicine).  Patients are able to view lab/test results, encounter notes, upcoming appointments, etc.  Non-urgent messages can be sent to your provider as well.   To learn more about what you can do with MyChart, go to [email or web address].    Important Information About Sugar             6 SALTY THINGS TO AVOID     1,800MG  DAILY

## 2022-07-07 LAB — BASIC METABOLIC PANEL
BUN/Creatinine Ratio: 10 (ref 9–20)
BUN: 9 mg/dL (ref 6–24)
CO2: 32 mmol/L — ABNORMAL HIGH (ref 20–29)
Calcium: 9.1 mg/dL (ref 8.7–10.2)
Chloride: 102 mmol/L (ref 96–106)
Creatinine, Ser: 0.89 mg/dL (ref 0.76–1.27)
Glucose: 103 mg/dL — ABNORMAL HIGH (ref 70–99)
Potassium: 4.5 mmol/L (ref 3.5–5.2)
Sodium: 145 mmol/L — ABNORMAL HIGH (ref 134–144)
eGFR: 102 mL/min/{1.73_m2} (ref >59)

## 2022-07-07 LAB — CBC
Hematocrit: 41.4 % (ref 37.5–51.0)
Hemoglobin: 12.8 g/dL — ABNORMAL LOW (ref 13.0–17.7)
MCH: 23.1 pg — ABNORMAL LOW (ref 26.6–33.0)
MCHC: 30.9 g/dL — ABNORMAL LOW (ref 31.5–35.7)
MCV: 75 fL — ABNORMAL LOW (ref 79–97)
Platelets: 233 10*3/uL (ref 150–450)
RBC: 5.54 x10E6/uL (ref 4.14–5.80)
RDW: 19.7 % — ABNORMAL HIGH (ref 11.6–15.4)
WBC: 5 10*3/uL (ref 3.4–10.8)

## 2022-07-14 NOTE — Telephone Encounter (Signed)
error 

## 2022-07-30 ENCOUNTER — Ambulatory Visit: Payer: Commercial Managed Care - HMO | Admitting: Family Medicine

## 2022-07-30 ENCOUNTER — Ambulatory Visit (HOSPITAL_COMMUNITY): Payer: Commercial Managed Care - HMO

## 2022-08-10 ENCOUNTER — Ambulatory Visit (HOSPITAL_COMMUNITY): Payer: Commercial Managed Care - HMO | Attending: Cardiovascular Disease

## 2022-08-11 ENCOUNTER — Encounter (HOSPITAL_COMMUNITY): Payer: Self-pay | Admitting: General Practice

## 2022-08-26 ENCOUNTER — Ambulatory Visit: Payer: Commercial Managed Care - HMO | Admitting: Family Medicine

## 2022-09-12 ENCOUNTER — Emergency Department (HOSPITAL_COMMUNITY): Payer: Commercial Managed Care - HMO

## 2022-09-12 ENCOUNTER — Inpatient Hospital Stay (HOSPITAL_COMMUNITY)
Admission: EM | Admit: 2022-09-12 | Discharge: 2022-09-17 | DRG: 208 | Disposition: A | Discharge status: Home / Self Care | Payer: Self-pay | Attending: Internal Medicine | Admitting: Internal Medicine

## 2022-09-12 ENCOUNTER — Encounter (HOSPITAL_COMMUNITY): Payer: Self-pay

## 2022-09-12 DIAGNOSIS — E8729 Other acidosis: Secondary | ICD-10-CM | POA: Diagnosis present

## 2022-09-12 DIAGNOSIS — I5032 Chronic diastolic (congestive) heart failure: Secondary | ICD-10-CM | POA: Diagnosis present

## 2022-09-12 DIAGNOSIS — J440 Chronic obstructive pulmonary disease with acute lower respiratory infection: Secondary | ICD-10-CM | POA: Diagnosis present

## 2022-09-12 DIAGNOSIS — Z888 Allergy status to other drugs, medicaments and biological substances status: Secondary | ICD-10-CM

## 2022-09-12 DIAGNOSIS — N179 Acute kidney failure, unspecified: Secondary | ICD-10-CM | POA: Diagnosis present

## 2022-09-12 DIAGNOSIS — Z7982 Long term (current) use of aspirin: Secondary | ICD-10-CM

## 2022-09-12 DIAGNOSIS — J441 Chronic obstructive pulmonary disease with (acute) exacerbation: Secondary | ICD-10-CM | POA: Insufficient documentation

## 2022-09-12 DIAGNOSIS — Z6841 Body Mass Index (BMI) 40.0 and over, adult: Secondary | ICD-10-CM

## 2022-09-12 DIAGNOSIS — I878 Other specified disorders of veins: Secondary | ICD-10-CM | POA: Diagnosis present

## 2022-09-12 DIAGNOSIS — G9340 Encephalopathy, unspecified: Secondary | ICD-10-CM

## 2022-09-12 DIAGNOSIS — J9621 Acute and chronic respiratory failure with hypoxia: Principal | ICD-10-CM | POA: Diagnosis present

## 2022-09-12 DIAGNOSIS — I130 Hypertensive heart and chronic kidney disease with heart failure and stage 1 through stage 4 chronic kidney disease, or unspecified chronic kidney disease: Secondary | ICD-10-CM | POA: Diagnosis present

## 2022-09-12 DIAGNOSIS — B351 Tinea unguium: Secondary | ICD-10-CM | POA: Diagnosis present

## 2022-09-12 DIAGNOSIS — E1122 Type 2 diabetes mellitus with diabetic chronic kidney disease: Secondary | ICD-10-CM | POA: Diagnosis present

## 2022-09-12 DIAGNOSIS — I5033 Acute on chronic diastolic (congestive) heart failure: Secondary | ICD-10-CM | POA: Diagnosis present

## 2022-09-12 DIAGNOSIS — R0602 Shortness of breath: Principal | ICD-10-CM

## 2022-09-12 DIAGNOSIS — I10 Essential (primary) hypertension: Secondary | ICD-10-CM | POA: Diagnosis present

## 2022-09-12 DIAGNOSIS — Z79890 Hormone replacement therapy (postmenopausal): Secondary | ICD-10-CM

## 2022-09-12 DIAGNOSIS — E662 Morbid (severe) obesity with alveolar hypoventilation: Secondary | ICD-10-CM | POA: Diagnosis present

## 2022-09-12 DIAGNOSIS — J9620 Acute and chronic respiratory failure, unspecified whether with hypoxia or hypercapnia: Secondary | ICD-10-CM | POA: Diagnosis present

## 2022-09-12 DIAGNOSIS — J9622 Acute and chronic respiratory failure with hypercapnia: Secondary | ICD-10-CM | POA: Diagnosis present

## 2022-09-12 DIAGNOSIS — E66813 Obesity, class 3: Secondary | ICD-10-CM | POA: Diagnosis present

## 2022-09-12 DIAGNOSIS — Z91199 Patient's noncompliance with other medical treatment and regimen due to unspecified reason: Secondary | ICD-10-CM

## 2022-09-12 DIAGNOSIS — E1169 Type 2 diabetes mellitus with other specified complication: Secondary | ICD-10-CM | POA: Diagnosis present

## 2022-09-12 DIAGNOSIS — J189 Pneumonia, unspecified organism: Secondary | ICD-10-CM | POA: Diagnosis present

## 2022-09-12 DIAGNOSIS — I248 Other forms of acute ischemic heart disease: Secondary | ICD-10-CM | POA: Diagnosis present

## 2022-09-12 DIAGNOSIS — Z20822 Contact with and (suspected) exposure to covid-19: Secondary | ICD-10-CM | POA: Diagnosis present

## 2022-09-12 DIAGNOSIS — E785 Hyperlipidemia, unspecified: Secondary | ICD-10-CM | POA: Diagnosis present

## 2022-09-12 DIAGNOSIS — R7989 Other specified abnormal findings of blood chemistry: Secondary | ICD-10-CM

## 2022-09-12 DIAGNOSIS — Z806 Family history of leukemia: Secondary | ICD-10-CM

## 2022-09-12 DIAGNOSIS — G9341 Metabolic encephalopathy: Secondary | ICD-10-CM | POA: Diagnosis present

## 2022-09-12 DIAGNOSIS — Z79899 Other long term (current) drug therapy: Secondary | ICD-10-CM

## 2022-09-12 LAB — CBC WITH DIFFERENTIAL/PLATELET
Abs Immature Granulocytes: 0.09 10*3/uL — ABNORMAL HIGH (ref 0.00–0.07)
Basophils Absolute: 0 10*3/uL (ref 0.0–0.1)
Basophils Relative: 0 %
Eosinophils Absolute: 0 10*3/uL (ref 0.0–0.5)
Eosinophils Relative: 0 %
HCT: 44.7 % (ref 39.0–52.0)
Hemoglobin: 13.4 g/dL (ref 13.0–17.0)
Immature Granulocytes: 1 %
Lymphocytes Relative: 23 %
Lymphs Abs: 2.7 10*3/uL (ref 0.7–4.0)
MCH: 23.9 pg — ABNORMAL LOW (ref 26.0–34.0)
MCHC: 30 g/dL (ref 30.0–36.0)
MCV: 79.7 fL — ABNORMAL LOW (ref 80.0–100.0)
Monocytes Absolute: 1.1 10*3/uL — ABNORMAL HIGH (ref 0.1–1.0)
Monocytes Relative: 9 %
Neutro Abs: 7.7 10*3/uL (ref 1.7–7.7)
Neutrophils Relative %: 67 %
Platelets: 251 10*3/uL (ref 150–400)
RBC: 5.61 MIL/uL (ref 4.22–5.81)
RDW: 21.8 % — ABNORMAL HIGH (ref 11.5–15.5)
WBC: 11.6 10*3/uL — ABNORMAL HIGH (ref 4.0–10.5)
nRBC: 2.2 % — ABNORMAL HIGH (ref 0.0–0.2)

## 2022-09-12 LAB — COMPREHENSIVE METABOLIC PANEL
ALT: 51 U/L — ABNORMAL HIGH (ref 0–44)
AST: 65 U/L — ABNORMAL HIGH (ref 15–41)
Albumin: 2.8 g/dL — ABNORMAL LOW (ref 3.5–5.0)
Alkaline Phosphatase: 152 U/L — ABNORMAL HIGH (ref 38–126)
Anion gap: 12 (ref 5–15)
BUN: 24 mg/dL — ABNORMAL HIGH (ref 6–20)
CO2: 33 mmol/L — ABNORMAL HIGH (ref 22–32)
Calcium: 8.6 mg/dL — ABNORMAL LOW (ref 8.9–10.3)
Chloride: 96 mmol/L — ABNORMAL LOW (ref 98–111)
Creatinine, Ser: 1.67 mg/dL — ABNORMAL HIGH (ref 0.61–1.24)
GFR, Estimated: 48 mL/min — ABNORMAL LOW (ref >60)
Glucose, Bld: 116 mg/dL — ABNORMAL HIGH (ref 70–99)
Potassium: 5.1 mmol/L (ref 3.5–5.1)
Sodium: 141 mmol/L (ref 135–145)
Total Bilirubin: 1.4 mg/dL — ABNORMAL HIGH (ref 0.3–1.2)
Total Protein: 7.4 g/dL (ref 6.5–8.1)

## 2022-09-12 LAB — I-STAT ARTERIAL BLOOD GAS, ED
Acid-Base Excess: 7 mmol/L — ABNORMAL HIGH (ref 0.0–2.0)
Acid-Base Excess: 9 mmol/L — ABNORMAL HIGH (ref 0.0–2.0)
Acid-Base Excess: 9 mmol/L — ABNORMAL HIGH (ref 0.0–2.0)
Bicarbonate: 36.2 mmol/L — ABNORMAL HIGH (ref 20.0–28.0)
Bicarbonate: 39.2 mmol/L — ABNORMAL HIGH (ref 20.0–28.0)
Bicarbonate: 40 mmol/L — ABNORMAL HIGH (ref 20.0–28.0)
Calcium, Ion: 1.11 mmol/L — ABNORMAL LOW (ref 1.15–1.40)
Calcium, Ion: 1.15 mmol/L (ref 1.15–1.40)
Calcium, Ion: 1.19 mmol/L (ref 1.15–1.40)
HCT: 43 % (ref 39.0–52.0)
HCT: 43 % (ref 39.0–52.0)
HCT: 45 % (ref 39.0–52.0)
Hemoglobin: 14.6 g/dL (ref 13.0–17.0)
Hemoglobin: 14.6 g/dL (ref 13.0–17.0)
Hemoglobin: 15.3 g/dL (ref 13.0–17.0)
O2 Saturation: 95 %
O2 Saturation: 96 %
O2 Saturation: 98 %
Patient temperature: 99
Patient temperature: 99.8
Patient temperature: 99.8
Potassium: 4.1 mmol/L (ref 3.5–5.1)
Potassium: 4.3 mmol/L (ref 3.5–5.1)
Potassium: 4.5 mmol/L (ref 3.5–5.1)
Sodium: 138 mmol/L (ref 135–145)
Sodium: 139 mmol/L (ref 135–145)
Sodium: 140 mmol/L (ref 135–145)
TCO2: 38 mmol/L — ABNORMAL HIGH (ref 22–32)
TCO2: 42 mmol/L — ABNORMAL HIGH (ref 22–32)
TCO2: 43 mmol/L — ABNORMAL HIGH (ref 22–32)
pCO2 arterial: 104.1 mmHg (ref 32–48)
pCO2 arterial: 60.4 mmHg — ABNORMAL HIGH (ref 32–48)
pCO2 arterial: 91.8 mmHg (ref 32–48)
pH, Arterial: 7.188 — CL (ref 7.35–7.45)
pH, Arterial: 7.25 — ABNORMAL LOW (ref 7.35–7.45)
pH, Arterial: 7.387 (ref 7.35–7.45)
pO2, Arterial: 104 mmHg (ref 83–108)
pO2, Arterial: 142 mmHg — ABNORMAL HIGH (ref 83–108)
pO2, Arterial: 87 mmHg (ref 83–108)

## 2022-09-12 LAB — BASIC METABOLIC PANEL
Anion gap: 10 (ref 5–15)
BUN: 24 mg/dL — ABNORMAL HIGH (ref 6–20)
CO2: 36 mmol/L — ABNORMAL HIGH (ref 22–32)
Calcium: 8.7 mg/dL — ABNORMAL LOW (ref 8.9–10.3)
Chloride: 95 mmol/L — ABNORMAL LOW (ref 98–111)
Creatinine, Ser: 1.47 mg/dL — ABNORMAL HIGH (ref 0.61–1.24)
GFR, Estimated: 56 mL/min — ABNORMAL LOW (ref >60)
Glucose, Bld: 132 mg/dL — ABNORMAL HIGH (ref 70–99)
Potassium: 5.3 mmol/L — ABNORMAL HIGH (ref 3.5–5.1)
Sodium: 141 mmol/L (ref 135–145)

## 2022-09-12 LAB — TROPONIN I (HIGH SENSITIVITY)
Troponin I (High Sensitivity): 51 ng/L — ABNORMAL HIGH (ref <18)
Troponin I (High Sensitivity): 62 ng/L — ABNORMAL HIGH (ref <18)

## 2022-09-12 LAB — LIPASE, BLOOD: Lipase: 19 U/L (ref 11–51)

## 2022-09-12 LAB — RESP PANEL BY RT-PCR (FLU A&B, COVID) ARPGX2
Influenza A by PCR: NEGATIVE
Influenza B by PCR: NEGATIVE
SARS Coronavirus 2 by RT PCR: NEGATIVE

## 2022-09-12 LAB — D-DIMER, QUANTITATIVE: D-Dimer, Quant: 1.34 ug/mL-FEU — ABNORMAL HIGH (ref 0.00–0.50)

## 2022-09-12 LAB — BRAIN NATRIURETIC PEPTIDE: B Natriuretic Peptide: 427 pg/mL — ABNORMAL HIGH (ref 0.0–100.0)

## 2022-09-12 MED ORDER — PANTOPRAZOLE 2 MG/ML SUSPENSION: 40.0000 mg | Freq: Every day | ORAL | Status: DC

## 2022-09-12 MED ORDER — MAGNESIUM SULFATE 2 GM/50ML IV SOLN
2.0000 g | Freq: Once | INTRAVENOUS | Status: AC
  Administered 2022-09-13: 2 g via INTRAVENOUS
  Filled 2022-09-12: qty 50

## 2022-09-12 MED ORDER — HEPARIN SODIUM (PORCINE) 5000 UNIT/ML IJ SOLN
5000.0000 [IU] | Freq: Three times a day (TID) | INTRAMUSCULAR | Status: DC
  Administered 2022-09-13 – 2022-09-17 (×13): 5000 [IU] via SUBCUTANEOUS
  Filled 2022-09-12 (×14): qty 1

## 2022-09-12 MED ORDER — SUCCINYLCHOLINE CHLORIDE 200 MG/10ML IV SOSY
PREFILLED_SYRINGE | INTRAVENOUS | Status: AC
  Filled 2022-09-12: qty 10

## 2022-09-12 MED ORDER — PROPOFOL 1000 MG/100ML IV EMUL: 0.0000 ug/kg/min | INTRAVENOUS | Status: DC

## 2022-09-12 MED ORDER — ETOMIDATE 2 MG/ML IV SOLN
30.0000 mg | Freq: Once | INTRAVENOUS | Status: AC
  Filled 2022-09-12: qty 20

## 2022-09-12 MED ORDER — ASPIRIN 81 MG PO TBEC: 81.0000 mg | DELAYED_RELEASE_TABLET | Freq: Every day | ORAL | Status: DC

## 2022-09-12 MED ORDER — METHYLPREDNISOLONE SODIUM SUCC 125 MG IJ SOLR
125.0000 mg | Freq: Once | INTRAMUSCULAR | Status: AC
  Administered 2022-09-12: 125 mg via INTRAVENOUS
  Filled 2022-09-12: qty 2

## 2022-09-12 MED ORDER — ETOMIDATE 2 MG/ML IV SOLN
INTRAVENOUS | Status: AC
  Administered 2022-09-12: 30 mg via INTRAVENOUS
  Filled 2022-09-12: qty 20

## 2022-09-12 MED ORDER — DOCUSATE SODIUM 100 MG PO CAPS: 100.0000 mg | ORAL_CAPSULE | Freq: Two times a day (BID) | ORAL | Status: DC | PRN

## 2022-09-12 MED ORDER — IPRATROPIUM-ALBUTEROL 0.5-2.5 (3) MG/3ML IN SOLN: 3.0000 mL | Freq: Four times a day (QID) | RESPIRATORY_TRACT | Status: DC | PRN

## 2022-09-12 MED ORDER — FENTANYL CITRATE PF 50 MCG/ML IJ SOSY
PREFILLED_SYRINGE | INTRAMUSCULAR | Status: AC
  Filled 2022-09-12: qty 2

## 2022-09-12 MED ORDER — ROCURONIUM BROMIDE 10 MG/ML (PF) SYRINGE
PREFILLED_SYRINGE | INTRAVENOUS | Status: AC
  Administered 2022-09-12: 100 mg via INTRAVENOUS
  Filled 2022-09-12: qty 10

## 2022-09-12 MED ORDER — ROCURONIUM BROMIDE 50 MG/5ML IV SOLN
100.0000 mg | Freq: Once | INTRAVENOUS | Status: AC
  Filled 2022-09-12: qty 10

## 2022-09-12 MED ORDER — MIDAZOLAM HCL 2 MG/2ML IJ SOLN
INTRAMUSCULAR | Status: AC
  Filled 2022-09-12: qty 2

## 2022-09-12 MED ORDER — MIDAZOLAM HCL 2 MG/2ML IJ SOLN
2.0000 mg | INTRAMUSCULAR | Status: DC | PRN
  Administered 2022-09-13: 2 mg via INTRAVENOUS
  Filled 2022-09-12 (×2): qty 2

## 2022-09-12 MED ORDER — PANTOPRAZOLE 2 MG/ML SUSPENSION
40.0000 mg | Freq: Every day | ORAL | Status: DC
  Administered 2022-09-13 – 2022-09-17 (×5): 40 mg
  Filled 2022-09-12 (×5): qty 20

## 2022-09-12 MED ORDER — POLYETHYLENE GLYCOL 3350 17 G PO PACK
17.0000 g | PACK | Freq: Every day | ORAL | Status: DC
  Administered 2022-09-13: 17 g
  Filled 2022-09-12 (×3): qty 1

## 2022-09-12 MED ORDER — FENTANYL CITRATE PF 50 MCG/ML IJ SOSY
50.0000 ug | PREFILLED_SYRINGE | INTRAMUSCULAR | Status: DC | PRN
  Administered 2022-09-13 (×2): 50 ug via INTRAVENOUS
  Filled 2022-09-12 (×2): qty 1

## 2022-09-12 MED ORDER — IPRATROPIUM-ALBUTEROL 0.5-2.5 (3) MG/3ML IN SOLN
3.0000 mL | RESPIRATORY_TRACT | Status: DC
  Administered 2022-09-12 (×3): 3 mL via RESPIRATORY_TRACT
  Filled 2022-09-12: qty 6
  Filled 2022-09-12: qty 3

## 2022-09-12 MED ORDER — METHYLPREDNISOLONE SODIUM SUCC 40 MG IJ SOLR
40.0000 mg | Freq: Two times a day (BID) | INTRAMUSCULAR | Status: DC
  Administered 2022-09-13 – 2022-09-14 (×3): 40 mg via INTRAVENOUS
  Filled 2022-09-12 (×3): qty 1

## 2022-09-12 MED ORDER — DEXTROSE-NACL 5-0.45 % IV SOLN: INTRAVENOUS | Status: DC

## 2022-09-12 MED ORDER — MIDAZOLAM HCL 2 MG/2ML IJ SOLN
2.0000 mg | INTRAMUSCULAR | Status: DC | PRN
  Administered 2022-09-13: 2 mg via INTRAVENOUS

## 2022-09-12 MED ORDER — IPRATROPIUM-ALBUTEROL 0.5-2.5 (3) MG/3ML IN SOLN
3.0000 mL | RESPIRATORY_TRACT | Status: DC
  Administered 2022-09-12 – 2022-09-13 (×3): 3 mL via RESPIRATORY_TRACT
  Filled 2022-09-12 (×3): qty 3

## 2022-09-12 MED ORDER — POLYETHYLENE GLYCOL 3350 17 G PO PACK: 17.0000 g | PACK | Freq: Every day | ORAL | Status: DC | PRN

## 2022-09-12 MED ORDER — DOCUSATE SODIUM 50 MG/5ML PO LIQD
100.0000 mg | Freq: Two times a day (BID) | ORAL | Status: DC
  Administered 2022-09-13: 100 mg
  Filled 2022-09-12 (×8): qty 10

## 2022-09-12 MED ORDER — IPRATROPIUM-ALBUTEROL 0.5-2.5 (3) MG/3ML IN SOLN: 3.0000 mL | RESPIRATORY_TRACT | Status: DC | PRN

## 2022-09-12 MED ORDER — FENTANYL CITRATE PF 50 MCG/ML IJ SOSY: 50.0000 ug | PREFILLED_SYRINGE | INTRAMUSCULAR | Status: DC | PRN

## 2022-09-12 MED ORDER — PROPOFOL 1000 MG/100ML IV EMUL
5.0000 ug/kg/min | INTRAVENOUS | Status: DC
  Administered 2022-09-12: 5 ug/kg/min via INTRAVENOUS
  Administered 2022-09-13 (×2): 45 ug/kg/min via INTRAVENOUS
  Administered 2022-09-13: 50 ug/kg/min via INTRAVENOUS
  Administered 2022-09-13: 65 ug/kg/min via INTRAVENOUS
  Administered 2022-09-13: 70.04 ug/kg/min via INTRAVENOUS
  Filled 2022-09-12 (×3): qty 100
  Filled 2022-09-12: qty 200
  Filled 2022-09-12: qty 100

## 2022-09-12 MED ORDER — INSULIN ASPART 100 UNIT/ML IJ SOLN
0.0000 [IU] | INTRAMUSCULAR | Status: DC
  Administered 2022-09-13: 1 [IU] via SUBCUTANEOUS
  Administered 2022-09-13 (×2): 2 [IU] via SUBCUTANEOUS
  Administered 2022-09-13: 5 [IU] via SUBCUTANEOUS
  Administered 2022-09-14 (×4): 1 [IU] via SUBCUTANEOUS

## 2022-09-12 MED ORDER — KETAMINE HCL 50 MG/5ML IJ SOSY
PREFILLED_SYRINGE | INTRAMUSCULAR | Status: AC
  Filled 2022-09-12: qty 5

## 2022-09-12 NOTE — ED Triage Notes (Addendum)
Pt coming from home by [facility] for [facility]. Pt has hx of COPD and wears 3L of O2 at baseline. Pt has been without his oxygen for a couple of weeks. EMS reports pt SPO2 80% on room air upon their arrival. EMS placed pt on 6 L, SPO2 currently 88-91%. Pt lives with roommates and has no transportation. Pt ran out of oxygen and his home equipment has had issues.   BP 148/86 HR 92 RR 20 80% room air  CBG 112

## 2022-09-12 NOTE — ED Provider Notes (Signed)
[facility] EMERGENCY DEPARTMENT Provider Note   CSN: [id number] Arrival date & time: 09/12/22  1835     History  Chief Complaint  Patient presents with   COPD   Shortness of Breath    Edwin Mcclain is a 56 y.o. male.  With history of COPD, CHF, morbid obesity, obstructive sleep apnea, hypertension who presents to the ED via EMS for evaluation of shortness of breath.  Per EMS, patient has been without home oxygen for 3 weeks and had respiratory distress upon arrival with pulse ox around 80%.  Patient was placed on 6 L via nasal cannula reportedly had improvement in his respiratory distress symptoms.  Patient typically wears 3 L O2 via nasal cannula at baseline.  Patient presents lethargic and falls asleep quickly after each question is asked.  Currently complaining of shortness of breath, chest pain, abdominal pain.  Will not specifically describe any other symptoms.  Unable to tell me exactly why he has been without home oxygen.  Unable to tell me if he has been taking his home medications as prescribed.   COPD Associated symptoms include chest pain, abdominal pain and shortness of breath.  Shortness of Breath Associated symptoms: abdominal pain and chest pain        Home Medications Prior to Admission medications   Medication Sig Start Date End Date Taking? Authorizing Provider  Accu-Chek Softclix Lancets lancets Use to check blood sugar once daily. 03/30/22   Charlott Rakes, MD  aspirin 81 MG EC tablet Take 1 tablet (81 mg total) by mouth daily. 12/12/19   Argentina Donovan, PA-C  atorvastatin (LIPITOR) 10 MG tablet Take 1 tablet (10 mg total) by mouth daily. 03/30/22   Charlott Rakes, MD  Blood Glucose Monitoring Suppl (ACCU-CHEK GUIDE) w/Device KIT Use to check blood sugar once daily. 03/30/22   Charlott Rakes, MD  carvedilol (COREG) 25 MG tablet Take 1 tablet (25 mg total) by mouth 2 (two) times daily with a meal. 03/30/22   Charlott Rakes, MD  dapagliflozin  propanediol (FARXIGA) 10 MG TABS tablet Take 1 tablet (10 mg total) by mouth daily before breakfast. 03/30/22   Charlott Rakes, MD  glucose blood (ACCU-CHEK GUIDE) test strip Use to check blood sugar once daily. 03/30/22   Charlott Rakes, MD  iron polysaccharides (NIFEREX) 150 MG capsule Take 150 mg by mouth daily. 07/18/19   [provider]  Lancet Devices (ONE TOUCH DELICA LANCING DEV) MISC Use as directed. Dx: E10.9, E11.9 10/17/19   Ladell Pier, MD  losartan (COZAAR) 25 MG tablet TAKE 1 TABLET(25 MG) BY MOUTH DAILY 03/30/22   Charlott Rakes, MD  Melatonin 10 MG TABS Take 1 tablet by mouth at bedtime as needed (sleep).    [provider]  sitaGLIPtin (JANUVIA) 25 MG tablet Take 1 tablet (25 mg total) by mouth daily. 04/29/21   Camillia Herter, NP  torsemide (DEMADEX) 20 MG tablet Take 2 tablets (40 mg total) by mouth daily. 03/30/22   Charlott Rakes, MD  vitamin B-12 1000 MCG tablet Take 1 tablet (1,000 mcg total) by mouth daily. 04/05/21   Ghimire, Henreitta Leber, MD      Allergies    Metformin and related    Review of Systems   Review of Systems  Reason unable to perform ROS: Lethargic and gives limited history.  Respiratory:  Positive for shortness of breath.   Cardiovascular:  Positive for chest pain.  Gastrointestinal:  Positive for abdominal pain.    Physical Exam  Updated Vital Signs BP (!) 145/81   Pulse 88   Temp 99.8 F (37.7 C) (Oral)   Resp (!) 24   SpO2 92%  Physical Exam Vitals and nursing note reviewed.  Constitutional:      Appearance: Normal appearance. He is obese. He is ill-appearing. He is not toxic-appearing.  HENT:     Head: Normocephalic and atraumatic.  Eyes:     Pupils: Pupils are equal, round, and reactive to light.  Cardiovascular:     Rate and Rhythm: Normal rate and regular rhythm.     Pulses:          Radial pulses are 2+ on the right side and 2+ on the left side.       Dorsalis pedis pulses are 2+ on the right side and 2+ on  the left side.  Pulmonary:     Effort: Pulmonary effort is normal. Tachypnea present. No respiratory distress.     Breath sounds: Decreased breath sounds present.  Abdominal:     General: Abdomen is flat.     Tenderness: There is no abdominal tenderness.  Musculoskeletal:        General: Normal range of motion.     Cervical back: Neck supple.     Right lower leg: Edema present.     Left lower leg: Edema present.     Comments: Woody edema to bilateral lower extremities.  Skin:    General: Skin is warm and dry.  Neurological:     Mental Status: He is lethargic.  Psychiatric:        Mood and Affect: Mood normal.        Behavior: Behavior normal.     ED Results / Procedures / Treatments   Labs (all labs ordered are listed, but only abnormal results are displayed) Labs Reviewed  I-STAT ARTERIAL BLOOD GAS, ED - Abnormal; Notable for the following components:      Result Value   pH, Arterial 7.188 (*)    pCO2 arterial 104.1 (*)    Bicarbonate 39.2 (*)    TCO2 42 (*)    Acid-Base Excess 7.0 (*)    All other components within normal limits  RESP PANEL BY RT-PCR (FLU A&B, COVID) ARPGX2  CBC WITH DIFFERENTIAL/PLATELET  COMPREHENSIVE METABOLIC PANEL  BRAIN NATRIURETIC PEPTIDE  BLOOD GAS, ARTERIAL  D-DIMER, QUANTITATIVE  LIPASE, BLOOD  TROPONIN I (HIGH SENSITIVITY)    EKG None  Radiology DG Chest Port 1 View  Result Date: 09/12/2022 CLINICAL DATA:  Dyspnea, COPD EXAM: PORTABLE CHEST 1 VIEW COMPARISON:  03/31/2021 FINDINGS: The lungs are symmetrically expanded. Moderate cardiomegaly is stable. There is moderate perihilar interstitial pulmonary infiltrate most in keeping with mild to moderate cardiogenic failure. No pneumothorax or pleural effusion. No acute bone abnormality. IMPRESSION: Mild to moderate cardiogenic failure.  Stable cardiomegaly. Electronically Signed   By: Fidela Salisbury M.D.   On: 09/12/2022 19:25    Procedures .Critical Care  Performed by: Roylene Reason, PA-C Authorized by: Roylene Reason, PA-C   Critical care provider statement:    Critical care time (minutes):  45   Critical care time was exclusive of:  Separately billable procedures and treating other patients   Critical care was necessary to treat or prevent imminent or life-threatening deterioration of the following conditions:  Respiratory failure   Critical care was time spent personally by me on the following activities:  Discussions with consultants, ordering and performing treatments and interventions, ordering and review of laboratory studies, ordering  and review of radiographic studies, pulse oximetry, re-evaluation of patient's condition, examination of patient and evaluation of patient's response to treatment   I assumed direction of critical care for this patient from another provider in my specialty: no     Care discussed with: admitting provider   Procedure Name: Intubation Date/Time: 09/12/2022 11:47 PM  Performed by: Roylene Reason, PA-CPre-anesthesia Checklist: Patient identified, Patient being monitored, Emergency Drugs available, Timeout performed and Suction available Oxygen Delivery Method: Ambu bag Preoxygenation: Pre-oxygenation with 100% oxygen Induction Type: Rapid sequence Ventilation: Mask ventilation without difficulty Laryngoscope Size: 3 and Glidescope Tube size: 7.5 mm Number of attempts: 1 Airway Equipment and Method: Stylet and Video-laryngoscopy Placement Confirmation: ETT inserted through vocal cords under direct vision, CO2 detector and Breath sounds checked- equal and bilateral Secured at: 25 cm Tube secured with: ETT holder Comments: Procedure performed with supervision by Dr. Pearline Cables.        Medications Ordered in ED Medications  ipratropium-albuterol (DUONEB) 0.5-2.5 (3) MG/3ML nebulizer solution 3 mL (has no administration in time range)  methylPREDNISolone sodium succinate (SOLU-MEDROL) 125 mg/2 mL injection 125 mg (125  mg Intravenous Given 09/12/22 1944)    ED Course/ Medical Decision Making/ A&P Clinical Course as of 09/12/22 2317  Sat Sep 12, 2022  1935 DG Chest [place] [address] 1 View I personally reviewed and interpreted the image.  Mild to moderate cardiogenic failure noted with stable cardiomegaly [AS]  1949 I-Stat arterial blood gas, ED(!!) pH 7.188, PCO2 104.1, bicarb 39.  Patient will be started on BiPAP, given Solu-Medrol and 3 nebulizer treatments [AS]  2000 Echo on 07/06/2022 showed ejection fraction of 55 to 60% [AS]  2251 Patient intubated and ventilated due to unresponsiveness and hypercapnia of >110 [AS]  2302 Spoke with critical care.  They will admit for ventilator management in the setting of COPD exacerbation and encephalopathy [AS]    Clinical Course User Index [AS] Quandarius Nill, [place] Folk, PA-C                           Medical Decision Making Amount and/or Complexity of Data Reviewed Labs: ordered. Decision-making details documented in ED Course. Radiology: ordered. Decision-making details documented in ED Course.  Risk Prescription drug management.  This patient presents to the ED for concern of shortness of breath, this involves an extensive number of treatment options, and is a complaint that carries with it a high risk of complications and morbidity.  The emergent differential diagnosis for shortness of breath includes, but is not limited to, Pulmonary edema, bronchoconstriction, Pneumonia, Pulmonary embolism, Pneumotherax/ Hemothorax, Dysrythmia, ACS, COPD exacerbation, CHF exacerbation   Co morbidities that complicate the patient evaluation  COPD, CHF  My initial workup includes CBC, CMP, lipase, troponin, ABG, BMP, D-dimer, chest x-ray, bipap  Additional history obtained from: Nursing notes from this visit. Previous records within EMR system admission for shortness of breath, 03/31/2021, 07/13/2019, 02/07/2019 EMS provides personal history  I ordered, reviewed and interpreted labs  which include: CBC, CMP, lipase, troponin, ABG, BMP, D-dimer.  Initial ABG showed pH 7.18 and CO2 greater than 100.  Repeat ABG showed pH 7.25 and CO2 98.  Initial troponin elevated at 51.  BNP elevated at 427.  D-dimer elevated at 1.34.  CBC shows slight leukocytosis of 11.6.   I ordered imaging studies including chest x-ray, postintubation chest x-ray, CT PE study I independently visualized and interpreted imaging which showed initial chest x-ray showed cardiogenic failure.  Postintubation x-ray showed  ET tube 5 cm above the carina.  CT PE study not resulted at the time of admission. I agree with the radiologist interpretation  Cardiac Monitoring:  The patient was maintained on a cardiac monitor.  I personally viewed and interpreted the cardiac monitored which showed an underlying rhythm of: NSR  Consultations Obtained:  I requested consultation with the critical care Dr. Chase Caller,  and discussed lab and imaging findings as well as pertinent plan - they recommend: Admission for ventilator management in the setting of COPD exacerbation.  Afebrile, patient has remained hemodynamically stable.  He is a 56 year old male with a history of CHF and COPD who presents ED for evaluation of shortness of breath.  He is on 3 L via nasal cannula home oxygen, but has not been using it because he has been out for the past 3 weeks.  On initial evaluation he was drowsy but arousable.  Will give short answers, but will not give full answers.  ABG showed hypercarbia.  He was started on bipap and 3 DuoNeb treatments.  He was also given IV Solu-Medrol.  Repeat ABG showed improvement in his acidosis and CO2.  Approximately 1 hour after repeat ABG, patient was noted to be unresponsive to painful stimuli.  Repeat VBG at that time showed CO2 greater than 110.  Patient was then intubated and started on ventilator. Sedation by propofol.  Consult was placed to ICU.  He will be admitted for ventilator management in the setting  of respiratory failure and encephalopathy.  Patient's case discussed with Dr. Pearline Cables who agrees with plan to admit to ICU for ventilator management in the setting of COPD exacerbation.   Note: Portions of this report may have been transcribed using voice recognition software. Every effort was made to ensure accuracy; however, inadvertent computerized transcription errors may still be present.         Final Clinical Impression(s) / ED Diagnoses Final diagnoses:  None    Rx / DC Orders ED Discharge Orders     None         Roylene Reason, PA-C 10/23/14 [id number]    Lianne Cure, DO 59/29/24 [phone]

## 2022-09-12 NOTE — H&P (Cosign Needed Addendum)
NAMEKelvyn Mcclain, MRN:  [id number], DOB:  [birth year], LOS: 0 ADMISSION DATE:  09/12/2022, CONSULTATION DATE:  9/23 REFERRING MD:  Dr. Pearline Cables, CHIEF COMPLAINT:  ARF w/ hypercarbia   History of Present Illness:  Patient is a 56 year old male with pertinent PMH of COPD, diastolic CHF, morbid obesity, OSA, HTN presents to [facility] ED on 9/23 with SOB.  Patient has been about home O2 for about 3 weeks.  Patient typically wears 3 LNC at home.  On 9/23 patient called EMS for worsening respiratory distress.  Upon EMS arrival patient sats in the 69s.  Patient placed on [place] with improvement.  Patient lethargic but somewhat arousable.  Patient transferred to [facility] ED.  On arrival to [facility] ED patient remains lethargic.  Complaining of SOB, chest pain, abdominal pain.  Vitals stable. BS w/ expiratory wheezing. Initial ABG 7.18, 104, 104, 39.  Patient placed on BiPAP.  Duonebs given. Improvement in ABG to 7.25, 91, 142, 40.  COVID/flu negative.  CXR with no significant findings.  Temp 99 F.  WBC 11.6.  BNP 427, D-dimer 1.34, troponin 50.  Creatinine 1.67, mildly elevated LFTs.  Lipase WNL.  Patient began to have worsening lethargy despite BiPAP.  Patient intubated for airway protection.  PCCM consulted for ICU admission.  Pertinent  Medical History   Past Medical History:  Diagnosis Date   Acute diastolic HF (heart failure) ([place]) 02/08/2019   Acute respiratory failure with hypoxia ([place]) 02/08/2019   CHF (congestive heart failure) ([facility])    COPD (chronic obstructive pulmonary disease) ([facility])    HTN (hypertension)    Morbid obesity ([place])      Significant Hospital Events: Including procedures, antibiotic start and stop dates in addition to other pertinent events   9/23: Admitted to [facility]; failed BiPAP trial and intubated for COPD exacerbation  Interim History / Subjective:  See H&P  Objective   Blood pressure (!) 92/56, pulse 72, temperature 99 F (37.2 C), temperature source Axillary, resp. rate 18, height 6'  1" (1.854 m), weight (!) 176.9 kg, SpO2 100 %.       No intake or output data in the 24 hours ending 09/12/22 2311 Filed Weights   09/12/22 2237 09/12/22 2250  Weight: (!) 181.8 kg (!) 176.9 kg    Examination: General:  critically ill obese male on mech vent HEENT: MM pink/moist; ETT in place Neuro: sedated; PERRL CV: s1s2, RRR, no m/r/g PULM:  dim wheezing BS bilaterally; on mech vent PRVC GI: soft, bsx4 active  Extremities: BLE edema; dry/scaly skin present; onychomycosis b/l   Skin: no rashes or lesions appreciated    Resolved Hospital Problem list     Assessment & Plan:  Acute on chronic respiratory failure with hypercarbia: on 3L [place] at home COPD more likely vs. CHF exacerbation Hx of OSA P: -LTVV strategy with tidal volumes of 6-8 cc/kg ideal body weight -check ABG and adjust settings accordingly -Goal plateau pressures less than 30 and driving pressures less than 15 -Wean PEEP/FiO2 for SpO2 >92% -duoneb scheduled -continue IV steroids -will give dose of magnesium -VAP bundle in place -Daily SAT and SBT -PAD protocol in place -wean sedation for RASS goal 0 to -1 -Follow intermittent CXR and ABG PRN -send tracheal aspirate -Lasix x1  Acute encephalopathy: Likely hypercarbia related P: -Adjust vent settings for normocapnia -check UDS, ethanol, ammonia -Limit sedating meds -Sedation for RASS 0 to -1 -Consider further work-up/imaging if remains encephalopathic  Chronic diastolic CHF HTN HLD Elevated BNP and D-dimer P: -  CTA chest to rule out PE -Echo -Lasix x1 -DVT ultrasound -hold anti-hypertensives for now -hold statin w/ elevated lfts -cont asa -daily weights; strict I/o's  Leukocytosis -CXR with no significant findings P: -no obvious sign of infection; CXR no obvious findings, possible edema -hold abx for now; check pct -f/u ct abd/pelvis and UA -trend wbc/fever curve -f/u cultures  AKI P: -Trend BMP / urinary output -Replace  electrolytes as indicated -Avoid nephrotoxic agents, ensure adequate renal perfusion  Abdominal pain: stated per patient but patient confused/lethargic Mild transaminitis P: -Trend CMP -ct abd/pelvis pending -RUQ ultrasound  Best Practice (right click and "Reselect all SmartList Selections" daily)   Diet/type: NPO w/ meds via tube DVT prophylaxis: prophylactic heparin  GI prophylaxis: PPI Lines: N/A Foley:  Yes, and it is still needed Code Status:  full code Last date of multidisciplinary goals of care discussion [9/23 called sister Onalee Hua and updated over phone]  Labs   CBC: Recent Labs  Lab 09/12/22 1928 09/12/22 1933 09/12/22 2049  WBC  --  11.6*  --   NEUTROABS  --  7.7  --   HGB 15.3 13.4 14.6  HCT 45.0 44.7 43.0  MCV  --  79.7*  --   PLT  --  251  --     Basic Metabolic Panel: Recent Labs  Lab 09/12/22 1928 09/12/22 2009 09/12/22 2049  NA 140 141 139  K 4.1 5.1 4.3  CL  --  96*  --   CO2  --  33*  --   GLUCOSE  --  116*  --   BUN  --  24*  --   CREATININE  --  1.67*  --   CALCIUM  --  8.6*  --    GFR: Estimated Creatinine Clearance: 84.9 mL/min (A) (by C-G formula based on SCr of 1.67 mg/dL (H)). Recent Labs  Lab 09/12/22 1933  WBC 11.6*    Liver Function Tests: Recent Labs  Lab 09/12/22 2009  AST 65*  ALT 51*  ALKPHOS 152*  BILITOT 1.4*  PROT 7.4  ALBUMIN 2.8*   Recent Labs  Lab 09/12/22 2009  LIPASE 19   No results for input(s): "AMMONIA" in the last 168 hours.  ABG    Component Value Date/Time   PHART 7.250 (L) 09/12/2022 2049   PCO2ART 91.8 (HH) 09/12/2022 2049   PO2ART 142 (H) 09/12/2022 2049   HCO3 40.0 (H) 09/12/2022 2049   TCO2 43 (H) 09/12/2022 2049   O2SAT 98 09/12/2022 2049     Coagulation Profile: No results for input(s): "INR", "PROTIME" in the last 168 hours.  Cardiac Enzymes: No results for input(s): "CKTOTAL", "CKMB", "CKMBINDEX", "TROPONINI" in the last 168 hours.  HbA1C: Hemoglobin A1C  Date/Time  Value Ref Range Status  04/28/2022 01:53 PM 9.8 (A) 4.0 - 5.6 % Final  09/05/2020 11:13 AM 7.1 (A) 4.0 - 5.6 % Final   HbA1c, POC (controlled diabetic range)  Date/Time Value Ref Range Status  01/19/2022 04:22 PM 9.2 (A) 0.0 - 7.0 % Final   Hgb A1c MFr Bld  Date/Time Value Ref Range Status  04/01/2021 06:29 AM 7.7 (H) 4.8 - 5.6 % Final    Comment:    REPEATED TO VERIFY (NOTE) Pre diabetes:          5.7%-6.4%  Diabetes:              >6.4%  Glycemic control for   <7.0% adults with diabetes   09/12/2019 09:26 AM 7.2 (H) 4.8 -  5.6 % Final    Comment:             Prediabetes: 5.7 - 6.4          Diabetes: >6.4          Glycemic control for adults with diabetes: <7.0     CBG: No results for input(s): "GLUCAP" in the last 168 hours.  Review of Systems:   Patient is encephalopathic and/or intubated. Therefore history has been obtained from chart review.    Past Medical History:  He,  has a past medical history of Acute diastolic HF (heart failure) ([place]) (02/08/2019), Acute respiratory failure with hypoxia ([place]) (02/08/2019), CHF (congestive heart failure) ([place]), COPD (chronic obstructive pulmonary disease) ([place]), HTN (hypertension), and Morbid obesity ([place]).   Surgical History:   Past Surgical History:  Procedure Laterality Date   C-spine surgery       Social History:   reports that he has never smoked. He has never used smokeless tobacco. He reports that he does not drink alcohol and does not use drugs.   Family History:  His family history includes Cancer in his mother; Leukemia in his father.   Allergies Allergies  Allergen Reactions   Metformin And Related Diarrhea     Home Medications  Prior to Admission medications   Medication Sig Start Date End Date Taking? Authorizing Provider  Accu-Chek Softclix Lancets lancets Use to check blood sugar once daily. 03/30/22   Charlott Rakes, MD  aspirin 81 MG EC tablet Take 1 tablet (81 mg total) by mouth daily. 12/12/19    Argentina Donovan, PA-C  atorvastatin (LIPITOR) 10 MG tablet Take 1 tablet (10 mg total) by mouth daily. 03/30/22   Charlott Rakes, MD  Blood Glucose Monitoring Suppl (ACCU-CHEK GUIDE) w/Device KIT Use to check blood sugar once daily. 03/30/22   Charlott Rakes, MD  carvedilol (COREG) 25 MG tablet Take 1 tablet (25 mg total) by mouth 2 (two) times daily with a meal. 03/30/22   Charlott Rakes, MD  dapagliflozin propanediol (FARXIGA) 10 MG TABS tablet Take 1 tablet (10 mg total) by mouth daily before breakfast. 03/30/22   Charlott Rakes, MD  glucose blood (ACCU-CHEK GUIDE) test strip Use to check blood sugar once daily. 03/30/22   Charlott Rakes, MD  iron polysaccharides (NIFEREX) 150 MG capsule Take 150 mg by mouth daily. 07/18/19   [provider]  Lancet Devices (ONE TOUCH DELICA LANCING DEV) MISC Use as directed. Dx: E10.9, E11.9 10/17/19   Ladell Pier, MD  losartan (COZAAR) 25 MG tablet TAKE 1 TABLET(25 MG) BY MOUTH DAILY 03/30/22   Charlott Rakes, MD  Melatonin 10 MG TABS Take 1 tablet by mouth at bedtime as needed (sleep).    [provider]  sitaGLIPtin (JANUVIA) 25 MG tablet Take 1 tablet (25 mg total) by mouth daily. 04/29/21   Camillia Herter, NP  torsemide (DEMADEX) 20 MG tablet Take 2 tablets (40 mg total) by mouth daily. 03/30/22   Charlott Rakes, MD  vitamin B-12 1000 MCG tablet Take 1 tablet (1,000 mcg total) by mouth daily. 04/05/21   Ghimire, Henreitta Leber, MD     Critical care time: 45 minutes    JD Rexene Agent [facility] 09/12/2022, 11:11 PM  Please see [email or web address] for pager details.  From 7A-7P if no response, please call [phone]. After hours, please call ELink [phone].

## 2022-09-13 ENCOUNTER — Inpatient Hospital Stay (HOSPITAL_COMMUNITY): Payer: Commercial Managed Care - HMO

## 2022-09-13 ENCOUNTER — Inpatient Hospital Stay: Payer: Self-pay

## 2022-09-13 ENCOUNTER — Inpatient Hospital Stay (HOSPITAL_BASED_OUTPATIENT_CLINIC_OR_DEPARTMENT_OTHER): Payer: Commercial Managed Care - HMO

## 2022-09-13 ENCOUNTER — Other Ambulatory Visit: Payer: Self-pay

## 2022-09-13 DIAGNOSIS — R0603 Acute respiratory distress: Secondary | ICD-10-CM | POA: Diagnosis not present

## 2022-09-13 DIAGNOSIS — R609 Edema, unspecified: Secondary | ICD-10-CM

## 2022-09-13 DIAGNOSIS — R0602 Shortness of breath: Secondary | ICD-10-CM

## 2022-09-13 LAB — COMPREHENSIVE METABOLIC PANEL
ALT: 47 U/L — ABNORMAL HIGH (ref 0–44)
AST: 53 U/L — ABNORMAL HIGH (ref 15–41)
Albumin: 2.6 g/dL — ABNORMAL LOW (ref 3.5–5.0)
Alkaline Phosphatase: 123 U/L (ref 38–126)
Anion gap: 12 (ref 5–15)
BUN: 26 mg/dL — ABNORMAL HIGH (ref 6–20)
CO2: 30 mmol/L (ref 22–32)
Calcium: 8.4 mg/dL — ABNORMAL LOW (ref 8.9–10.3)
Chloride: 98 mmol/L (ref 98–111)
Creatinine, Ser: 1.37 mg/dL — ABNORMAL HIGH (ref 0.61–1.24)
GFR, Estimated: >60 mL/min (ref >60)
Glucose, Bld: 144 mg/dL — ABNORMAL HIGH (ref 70–99)
Potassium: 4.2 mmol/L (ref 3.5–5.1)
Sodium: 140 mmol/L (ref 135–145)
Total Bilirubin: 0.7 mg/dL (ref 0.3–1.2)
Total Protein: 6.8 g/dL (ref 6.5–8.1)

## 2022-09-13 LAB — RESPIRATORY PANEL BY PCR

## 2022-09-13 LAB — POCT I-STAT 7, (LYTES, BLD GAS, ICA,H+H)
Acid-Base Excess: 9 mmol/L — ABNORMAL HIGH (ref 0.0–2.0)
Bicarbonate: 34.2 mmol/L — ABNORMAL HIGH (ref 20.0–28.0)
Calcium, Ion: 1.06 mmol/L — ABNORMAL LOW (ref 1.15–1.40)
HCT: 38 % — ABNORMAL LOW (ref 39.0–52.0)
Hemoglobin: 12.9 g/dL — ABNORMAL LOW (ref 13.0–17.0)
O2 Saturation: 97 %
Patient temperature: 97.7
Potassium: 3.6 mmol/L (ref 3.5–5.1)
Sodium: 139 mmol/L (ref 135–145)
TCO2: 36 mmol/L — ABNORMAL HIGH (ref 22–32)
pCO2 arterial: 45 mmHg (ref 32–48)
pH, Arterial: 7.487 — ABNORMAL HIGH (ref 7.35–7.45)
pO2, Arterial: 86 mmHg (ref 83–108)

## 2022-09-13 LAB — URINALYSIS, COMPLETE (UACMP) WITH MICROSCOPIC
Bacteria, UA: NONE SEEN
Bilirubin Urine: NEGATIVE
Glucose, UA: NEGATIVE mg/dL
Ketones, ur: 5 mg/dL — AB
Leukocytes,Ua: NEGATIVE
Nitrite: NEGATIVE
Protein, ur: NEGATIVE mg/dL
Specific Gravity, Urine: >1.046 — ABNORMAL HIGH (ref 1.005–1.030)
pH: 5 (ref 5.0–8.0)

## 2022-09-13 LAB — CBC
HCT: 39.5 % (ref 39.0–52.0)
Hemoglobin: 12.1 g/dL — ABNORMAL LOW (ref 13.0–17.0)
MCH: 23.8 pg — ABNORMAL LOW (ref 26.0–34.0)
MCHC: 30.6 g/dL (ref 30.0–36.0)
MCV: 77.8 fL — ABNORMAL LOW (ref 80.0–100.0)
Platelets: 209 10*3/uL (ref 150–400)
RBC: 5.08 MIL/uL (ref 4.22–5.81)
RDW: 21.2 % — ABNORMAL HIGH (ref 11.5–15.5)
WBC: 8.8 10*3/uL (ref 4.0–10.5)
nRBC: 1.5 % — ABNORMAL HIGH (ref 0.0–0.2)

## 2022-09-13 LAB — RAPID URINE DRUG SCREEN, HOSP PERFORMED
Amphetamines: NOT DETECTED
Barbiturates: NOT DETECTED
Benzodiazepines: NOT DETECTED
Cocaine: NOT DETECTED
Opiates: NOT DETECTED
Tetrahydrocannabinol: NOT DETECTED

## 2022-09-13 LAB — MAGNESIUM: Magnesium: 2.3 mg/dL (ref 1.7–2.4)

## 2022-09-13 LAB — STREP PNEUMONIAE URINARY ANTIGEN: Strep Pneumo Urinary Antigen: NEGATIVE

## 2022-09-13 LAB — TRIGLYCERIDES: Triglycerides: 122 mg/dL (ref <150)

## 2022-09-13 LAB — ECHOCARDIOGRAM COMPLETE
Area-P 1/2: 2.63 cm2
Height: 73 in
S' Lateral: 2.8 cm
Weight: 6000 oz

## 2022-09-13 LAB — TROPONIN I (HIGH SENSITIVITY): Troponin I (High Sensitivity): 77 ng/L — ABNORMAL HIGH (ref <18)

## 2022-09-13 LAB — GLUCOSE, CAPILLARY
Glucose-Capillary: 140 mg/dL — ABNORMAL HIGH (ref 70–99)
Glucose-Capillary: 151 mg/dL — ABNORMAL HIGH (ref 70–99)
Glucose-Capillary: 159 mg/dL — ABNORMAL HIGH (ref 70–99)
Glucose-Capillary: 271 mg/dL — ABNORMAL HIGH (ref 70–99)

## 2022-09-13 LAB — PROCALCITONIN: Procalcitonin: 0.28 ng/mL

## 2022-09-13 LAB — LACTIC ACID, PLASMA: Lactic Acid, Venous: 1.6 mmol/L (ref 0.5–1.9)

## 2022-09-13 LAB — MRSA NEXT GEN BY PCR, NASAL: MRSA by PCR Next Gen: NOT DETECTED

## 2022-09-13 LAB — AMMONIA: Ammonia: 44 umol/L — ABNORMAL HIGH (ref 9–35)

## 2022-09-13 LAB — ETHANOL: Alcohol, Ethyl (B): <10 mg/dL (ref <10)

## 2022-09-13 LAB — PHOSPHORUS: Phosphorus: 4.4 mg/dL (ref 2.5–4.6)

## 2022-09-13 LAB — HIV ANTIBODY (ROUTINE TESTING W REFLEX): HIV Screen 4th Generation wRfx: NONREACTIVE

## 2022-09-13 MED ORDER — POTASSIUM CHLORIDE 20 MEQ PO PACK: 20.0000 meq | PACK | Freq: Once | ORAL | Status: DC

## 2022-09-13 MED ORDER — PERFLUTREN LIPID MICROSPHERE
1.0000 mL | INTRAVENOUS | Status: AC | PRN
  Administered 2022-09-13: 4 mL via INTRAVENOUS

## 2022-09-13 MED ORDER — IOHEXOL 350 MG/ML SOLN
100.0000 mL | Freq: Once | INTRAVENOUS | Status: AC | PRN
  Administered 2022-09-13: 100 mL via INTRAVENOUS

## 2022-09-13 MED ORDER — ORAL CARE MOUTH RINSE: 15.0000 mL | OROMUCOSAL | Status: DC | PRN

## 2022-09-13 MED ORDER — SODIUM CHLORIDE 0.9 % IV SOLN
500.0000 mg | INTRAVENOUS | Status: DC
  Administered 2022-09-13 – 2022-09-14 (×2): 500 mg via INTRAVENOUS
  Filled 2022-09-13 (×2): qty 5

## 2022-09-13 MED ORDER — POTASSIUM CHLORIDE 20 MEQ PO PACK
40.0000 meq | PACK | Freq: Once | ORAL | Status: AC
  Administered 2022-09-13: 40 meq
  Filled 2022-09-13: qty 2

## 2022-09-13 MED ORDER — CHLORHEXIDINE GLUCONATE CLOTH 2 % EX PADS
6.0000 | MEDICATED_PAD | Freq: Every day | CUTANEOUS | Status: DC
  Administered 2022-09-13 – 2022-09-17 (×5): 6 via TOPICAL

## 2022-09-13 MED ORDER — FUROSEMIDE 10 MG/ML IJ SOLN
60.0000 mg | Freq: Once | INTRAMUSCULAR | Status: AC
  Administered 2022-09-13: 60 mg via INTRAVENOUS
  Filled 2022-09-13: qty 6

## 2022-09-13 MED ORDER — ORAL CARE MOUTH RINSE
15.0000 mL | OROMUCOSAL | Status: DC
  Administered 2022-09-13: 15 mL via OROMUCOSAL

## 2022-09-13 MED ORDER — ASPIRIN 81 MG PO CHEW
81.0000 mg | CHEWABLE_TABLET | Freq: Every day | ORAL | Status: DC
  Administered 2022-09-13 – 2022-09-17 (×5): 81 mg
  Filled 2022-09-13 (×5): qty 1

## 2022-09-13 MED ORDER — FUROSEMIDE 10 MG/ML IJ SOLN
40.0000 mg | Freq: Once | INTRAMUSCULAR | Status: AC
  Administered 2022-09-13: 40 mg via INTRAVENOUS
  Filled 2022-09-13: qty 4

## 2022-09-13 MED ORDER — SODIUM CHLORIDE 0.9 % IV SOLN
2.0000 g | INTRAVENOUS | Status: DC
  Administered 2022-09-13 – 2022-09-14 (×2): 2 g via INTRAVENOUS
  Filled 2022-09-13 (×2): qty 20

## 2022-09-13 MED ORDER — ORAL CARE MOUTH RINSE
15.0000 mL | OROMUCOSAL | Status: DC
  Administered 2022-09-13 – 2022-09-14 (×3): 15 mL via OROMUCOSAL

## 2022-09-13 NOTE — Evaluation (Signed)
Physical Therapy Evaluation & Discharge Patient Details Name: Edwin Mcclain MRN: [id number] DOB: 12/07/1966 Today's Date: 09/13/2022  History of Present Illness  Pt is a 56 y.o. male admitted 09/12/22 with SOB, lethargy, chest pain; pt reports out of home O2 the past 3 weeks. CXR without acute finding. Workup for severe acute on chronic respiratory acidosis. Worsening lethargy despite BiPAP; ETT 9/23-9/24. PMH includes COPD (wears 3L O2 baseline), CHF, OSA, HTN, morbid obesity.   Clinical Impression  Patient evaluated by Physical Therapy with no further acute PT needs identified. PTA, pt independent without DME, lives with roommate, primarily sedentary. Today, pt moving well without DME, only requiring assist for lines; noted SOB with ambulation, but pt also talking entire walk. All education has been completed and the patient has no further questions. Acute PT is signing off. Thank you for this referral.    SpO2 down to 82% on 3L O2 [name] with ambulation    Recommendations for follow up therapy are one component of a multi-disciplinary discharge planning process, led by the attending physician.  Recommendations may be updated based on patient status, additional functional criteria and insurance authorization.  Follow Up Recommendations No PT follow up      Assistance Recommended at Discharge PRN  Patient can return home with the following  Direct supervision/assist for medications management;Direct supervision/assist for financial management;Assist for transportation    Equipment Recommendations None recommended by PT  Recommendations for Other Services    Mobility Specialist          Precautions / Restrictions Precautions Precautions: Other (comment) Precaution Comments: Watch SpO2 (wears 3L O2 baseline) Restrictions Weight Bearing Restrictions: No      Mobility  Bed Mobility               General bed mobility comments: received sitting in recliner    Transfers Overall  transfer level: Independent Equipment used: None                    Ambulation/Gait Ambulation/Gait assistance: Independent Gait Distance (Feet): 290 Feet Assistive device: None Gait Pattern/deviations: Step-through pattern, Decreased stride length, Wide base of support Gait velocity: Decreased     General Gait Details: slow, steady gait independent (assist for lines/O2 tank) without DME; pt conversing entire walk, at times stopping to lean against wall while talking; mild SOB noted. SpO2 down to 82% on 3L O2 [name]  Stairs            Wheelchair Mobility    Modified Rankin (Stroke Patients Only)       Balance Overall balance assessment: No apparent balance deficits (not formally assessed)                                           Pertinent Vitals/Pain Pain Assessment Pain Assessment: No/denies pain    Home Living Family/patient expects to be discharged to:: Private residence Living Arrangements: Non-relatives/Friends Available Help at Discharge: Friend(s);Available PRN/intermittently Type of Home: House Home Access: Stairs to enter Entrance Stairs-Rails:  ("sort of") Secretary/administrator of Steps: "not many"   Home Layout: One level Home Equipment: None      Prior Function Prior Level of Function : Independent/Modified Independent;Driving             Mobility Comments: Independent without DME; reports primarily sedentary, enjoys being alone. Can drive but does not drive, friends provide transportation  Hand Dominance        Extremity/Trunk Assessment   Upper Extremity Assessment Upper Extremity Assessment: Overall WFL for tasks assessed    Lower Extremity Assessment Lower Extremity Assessment: Overall WFL for tasks assessed (noted dry, scaly BLEs)       Communication   Communication: No difficulties  Cognition Arousal/Alertness: Awake/alert Behavior During Therapy: WFL for tasks assessed/performed Overall  Cognitive Status: No family/caregiver present to determine baseline cognitive functioning                                 General Comments: WFL for simple tasks; suspect baseline cognition        General Comments      Exercises     Assessment/Plan    PT Assessment Patient does not need any further PT services  PT Problem List         PT Treatment Interventions      PT Goals (Current goals can be found in the Care Plan section)  Acute Rehab PT Goals PT Goal Formulation: All assessment and education complete, DC therapy    Frequency       Co-evaluation               AM-PAC PT "6 Clicks" Mobility  Outcome Measure Help needed turning from your back to your side while in a flat bed without using bedrails?: None Help needed moving from lying on your back to sitting on the side of a flat bed without using bedrails?: None Help needed moving to and from a bed to a chair (including a wheelchair)?: None Help needed standing up from a chair using your arms (e.g., wheelchair or bedside chair)?: None Help needed to walk in hospital room?: None Help needed climbing 3-5 steps with a railing? : A Little 6 Click Score: 23    End of Session Equipment Utilized During Treatment: Oxygen Activity Tolerance: Patient tolerated treatment well Patient left: in chair;with call bell/phone within reach;with nursing/sitter in room Nurse Communication: Mobility status PT Visit Diagnosis: Other abnormalities of gait and mobility (R26.89)    Time: 5409-8119 PT Time Calculation (min) (ACUTE ONLY): 26 min   Charges:   PT Evaluation $PT Eval Moderate Complexity: [place], PT, DPT Acute Rehabilitation Services  Personal: [facility] Office: [phone]  Derry Lory 09/13/2022, 5:23 PM

## 2022-09-13 NOTE — Procedures (Signed)
Extubation Procedure Note  Patient Details:   Name: Edwin Mcclain DOB: May 14, 1966 MRN: [id number]   Airway Documentation:    Vent end date: 09/13/22 Vent end time: 1125   Evaluation  O2 sats: stable throughout Complications: No apparent complications Patient did tolerate procedure well. Bilateral Breath Sounds: Clear, Diminished   Yes  Esperanza Sheets T 09/13/2022, 11:29 AM

## 2022-09-13 NOTE — Progress Notes (Signed)
RT transported from ED to CT then up to [place] without any complications

## 2022-09-13 NOTE — Progress Notes (Signed)
   RN indicates patient BP soft on diprivan but without diprivan patients gets agitated Also difficult stick   Plan  500cc  fluid bolus [place] PICC line    SIGNATURE    Dr. Brand Males, M.D., F.C.C.P,  Pulmonary and Critical Care Medicine Staff Physician, [place] Director - Interstitial Lung Disease  Program  Medical Director - [place] [facility] [place] [facility] [place], [place], [address]  NPI Number:  NPI #[id number] [facility] Number: [id number]  Pager: [phone], If no answer  -Nadine or Try [place] Telephone (clinical office): [phone] Telephone (research): [phone]  6:37 AM 09/13/2022

## 2022-09-13 NOTE — Progress Notes (Signed)
Edwin Mcclain, MRN:  [id number], DOB:  03-26-1966, LOS: 1 ADMISSION DATE:  09/12/2022, CONSULTATION DATE:  9/23 REFERRING MD:  Dr. Pearline Cables, CHIEF COMPLAINT:  ARF w/ hypercarbia   History of Present Illness:  Patient is a 56 year old male with pertinent PMH of COPD, diastolic CHF, morbid obesity, OSA, HTN presents to [facility] ED on 9/23 with SOB.  Patient has been about home O2 for about 3 weeks.  Patient typically wears 3 LNC at home.  On 9/23 patient called EMS for worsening respiratory distress.  Upon EMS arrival patient sats in the 9s.  Patient placed on [place] with improvement.  Patient lethargic but somewhat arousable.  Patient transferred to [facility] ED.  On arrival to [facility] ED patient remains lethargic.  Complaining of SOB, chest pain, abdominal pain.  Vitals stable. BS w/ expiratory wheezing. Initial ABG 7.18, 104, 104, 39.  Patient placed on BiPAP.  Duonebs given. Improvement in ABG to 7.25, 91, 142, 40.  COVID/flu negative.  CXR with no significant findings.  Temp 99 F.  WBC 11.6.  BNP 427, D-dimer 1.34, troponin 50.  Creatinine 1.67, mildly elevated LFTs.  Lipase WNL.  Patient began to have worsening lethargy despite BiPAP.  Patient intubated for airway protection.  PCCM consulted for ICU admission.  Pertinent  Medical History   Past Medical History:  Diagnosis Date   Acute diastolic HF (heart failure) ([place]) 02/08/2019   Acute respiratory failure with hypoxia ([place]) 02/08/2019   CHF (congestive heart failure) (HCC)    COPD (chronic obstructive pulmonary disease) (HCC)    HTN (hypertension)    Morbid obesity ([place])      Significant Hospital Events: Including procedures, antibiotic start and stop dates in addition to other pertinent events   9/23: Admitted to [facility]; failed BiPAP trial and intubated for COPD exacerbation  Interim History / Subjective:  FiO2 was titrated down to 40% ABGs have improved, PCO2 trended down to 40s Remain on full mechanical ventilatory support  Objective   Blood  pressure (!) 100/56, pulse 62, temperature 97.7 F (36.5 C), temperature source Oral, resp. rate 18, height 6\' 1"  (1.854 m), weight (!) 170.1 kg, SpO2 96 %.    Vent Mode: PRVC FiO2 (%):  [40 %-60 %] 40 % Set Rate:  [18 bmp] 18 bmp Vt Set:  [640 mL] 640 mL PEEP:  [5 cmH20] 5 cmH20 Plateau Pressure:  [23 cmH20-26 cmH20] 26 cmH20   Intake/Output Summary (Last 24 hours) at 09/13/2022 0935 Last data filed at 09/13/2022 0900 Gross per 24 hour  Intake 1140.38 ml  Output 2425 ml  Net -1284.62 ml   Filed Weights   09/12/22 2237 09/12/22 2250 09/13/22 0500  Weight: (!) 181.8 kg (!) 176.9 kg (!) 170.1 kg    Examination:   Physical exam: General: Crtitically ill-appearing morbidly obese male, orally intubated HEENT: [place]/AT, eyes anicteric.  ETT and OGT in place Neuro: Sedated, not following commands.  Eyes are closed.  Pupils 3 mm bilateral reactive to light Chest: Reduced air entry all over, no wheezes or rhonchi Heart: Regular rate and rhythm, no murmurs or gallops Abdomen: Soft, nontender, nondistended, bowel sounds present Skin: no rashes or lesions appreciated  Resolved Hospital Problem list     Assessment & Plan:  Acute on chronic respiratory failure with hypoxia and hypercarbia Right lower lobe pneumonia Acute COPD exacerbation  OHS/OSA Continue protective ventilation ABG has been corrected, PCO2 came down to high 40s We will try to stop sedation and do SAT and SBT trial  Continue IV steroids Continue nebs CT chest confirmed right lower lobe infiltrates Follow-up respiratory culture Continue ceftriaxone azithromycin Resume diuretic therapy VAP bundle in place PAD protocol in place with propofol  Acute metabolic encephalopathy in the setting of hypercapnia Hypercapnia has corrected We will titrate down sedation with RASS goal -1 Minimize sedation  Chronic diastolic CHF Demand cardiac ischemia HTN HLD Elevated BNP and D-dimer Pulmonary emboli was ruled out with  negative CT angiogram Echo cardiogram is pending Monitor intake and output  AKI likely due to cardiorenal syndrome Serum creatinine trended down Closely monitor intake and output Replace electrolytes as indicated Avoid nephrotoxic agents, ensure adequate renal perfusion  Morbid obesity Dietitian follow-up  Best Practice (right click and "Reselect all SmartList Selections" daily)   Diet/type: NPO w/ meds via tube DVT prophylaxis: prophylactic heparin  GI prophylaxis: PPI Lines: N/A Foley:  Yes, and it is still needed Code Status:  full code Last date of multidisciplinary goals of care discussion [9/23 called sister Onalee Hua and updated over phone]  Labs   CBC: Recent Labs  Lab 09/12/22 1933 09/12/22 2049 09/12/22 2347 09/13/22 0131 09/13/22 0416  WBC 11.6*  --   --  8.8  --   NEUTROABS 7.7  --   --   --   --   HGB 13.4 14.6 14.6 12.1* 12.9*  HCT 44.7 43.0 43.0 39.5 38.0*  MCV 79.7*  --   --  77.8*  --   PLT 251  --   --  209  --     Basic Metabolic Panel: Recent Labs  Lab 09/12/22 2009 09/12/22 2049 09/12/22 2221 09/12/22 2347 09/13/22 0131 09/13/22 0416  NA 141 139 141 138 140 139  K 5.1 4.3 5.3* 4.5 4.2 3.6  CL 96*  --  95*  --  98  --   CO2 33*  --  36*  --  30  --   GLUCOSE 116*  --  132*  --  144*  --   BUN 24*  --  24*  --  26*  --   CREATININE 1.67*  --  1.47*  --  1.37*  --   CALCIUM 8.6*  --  8.7*  --  8.4*  --   MG  --   --   --   --  2.3  --   PHOS  --   --   --   --  4.4  --    GFR: Estimated Creatinine Clearance: 101.1 mL/min (A) (by C-G formula based on SCr of 1.37 mg/dL (H)). Recent Labs  Lab 09/12/22 1933 09/13/22 0131  PROCALCITON  --  0.28  WBC 11.6* 8.8  LATICACIDVEN  --  1.6    Liver Function Tests: Recent Labs  Lab 09/12/22 2009 09/13/22 0131  AST 65* 53*  ALT 51* 47*  ALKPHOS 152* 123  BILITOT 1.4* 0.7  PROT 7.4 6.8  ALBUMIN 2.8* 2.6*   Recent Labs  Lab 09/12/22 2009  LIPASE 19   Recent Labs  Lab 09/13/22 0131   AMMONIA 44*    ABG    Component Value Date/Time   PHART 7.487 (H) 09/13/2022 0416   PCO2ART 45.0 09/13/2022 0416   PO2ART 86 09/13/2022 0416   HCO3 34.2 (H) 09/13/2022 0416   TCO2 36 (H) 09/13/2022 0416   O2SAT 97 09/13/2022 0416     Coagulation Profile: No results for input(s): "INR", "PROTIME" in the last 168 hours.  Cardiac Enzymes: No results for input(s): "CKTOTAL", "CKMB", "CKMBINDEX", "  TROPONINI" in the last 168 hours.  HbA1C: Hemoglobin A1C  Date/Time Value Ref Range Status  04/28/2022 01:53 PM 9.8 (A) 4.0 - 5.6 % Final  09/05/2020 11:13 AM 7.1 (A) 4.0 - 5.6 % Final   HbA1c, POC (controlled diabetic range)  Date/Time Value Ref Range Status  01/19/2022 04:22 PM 9.2 (A) 0.0 - 7.0 % Final   Hgb A1c MFr Bld  Date/Time Value Ref Range Status  04/01/2021 06:29 AM 7.7 (H) 4.8 - 5.6 % Final    Comment:    REPEATED TO VERIFY (NOTE) Pre diabetes:          5.7%-6.4%  Diabetes:              >6.4%  Glycemic control for   <7.0% adults with diabetes   09/12/2019 09:26 AM 7.2 (H) 4.8 - 5.6 % Final    Comment:             Prediabetes: 5.7 - 6.4          Diabetes: >6.4          Glycemic control for adults with diabetes: <7.0     CBG: Recent Labs  Lab 09/13/22 0146 09/13/22 0850  GLUCAP 140* 151*    Critical care time:     Total critical care time: 42 minutes  Performed by: Jacky Kindle   Critical care time was exclusive of separately billable procedures and treating other patients.   Critical care was necessary to treat or prevent imminent or life-threatening deterioration.   Critical care was time spent personally by me on the following activities: development of treatment plan with patient and/or surrogate as well as nursing, discussions with consultants, evaluation of patient's response to treatment, examination of patient, obtaining history from patient or surrogate, ordering and performing treatments and interventions, ordering and review of  laboratory studies, ordering and review of radiographic studies, pulse oximetry and re-evaluation of patient's condition.   Jacky Kindle, MD [facility] See Amion for pager If no response to pager, please call [phone] until 7pm After 7pm, Please call E-link [phone]

## 2022-09-13 NOTE — Progress Notes (Signed)
eLink Physician-Brief Progress Note Patient Name: Arnold Kester DOB: 1966-01-22 MRN: [id number]   Date of Service  09/13/2022  HPI/Events of Note  55/M with history of CHF, COPD, morbid obesity, who presents with shortness of breath. Workup significant for hypoxemia and hypercapnia, and had been placed on BIPAP, given bronchodilators and systemic steroids. Despite interventions, however, pt developed worsening lethargy, and was ultimately intubated.   eICU Interventions  Acute on chronic hypercapnic, hypoxemic respiratory failure - s/p intubation     - Maintain on TV 6-59ml/kg PBW. Target plateau pressures <30    - Titrate FiO2 and PEEP to target SpO2 >90%    - Serial ABG, make further adjustments as needed.  - COPD exacerbation     - Continue systemic steroids     - Continue cheduled bronchodilators     - Monitor off antibiotics for now. Follow culture results.  - CHF      - Given one time dose of lasix - will monitor I/Os closely.        - Follow renal function as pt is diuresed.   2. DVT prophylaxis - [place] heparin         Caliya Narine M DELA CRUZ 09/13/2022, 1:26 AM

## 2022-09-13 NOTE — Progress Notes (Signed)
VASCULAR LAB    Bilateral lower extremity venous duplex has been performed.  See CV proc for preliminary results.   Sanskriti Greenlaw, RVT 09/13/2022, 10:38 AM

## 2022-09-13 NOTE — Progress Notes (Signed)
Echocardiogram 2D Echocardiogram has been performed.  Oneal Deputy Aden Youngman RDCS 09/13/2022, 9:37 AM

## 2022-09-13 NOTE — ED Notes (Signed)
Patient lethargic and non-responding to tactile, sternal rub, or pain. V/S remain stable. MD present in room at this time. RT called.

## 2022-09-13 NOTE — Progress Notes (Signed)
Pt states he wears 3L [place] at home and that he was without his O2 due to leaving his portable O2 tank in his cousin's car who lives out of town. Pt extubated to baseline 3L [place] and tolerating well. No distress, pt is alert and oriented x4. Bipap on standby, per CCM mandatory bipap QHS.

## 2022-09-14 LAB — URINE CULTURE: Culture: NO GROWTH

## 2022-09-14 LAB — BASIC METABOLIC PANEL
Anion gap: 10 (ref 5–15)
BUN: 19 mg/dL (ref 6–20)
CO2: 37 mmol/L — ABNORMAL HIGH (ref 22–32)
Calcium: 8.5 mg/dL — ABNORMAL LOW (ref 8.9–10.3)
Chloride: 95 mmol/L — ABNORMAL LOW (ref 98–111)
Creatinine, Ser: 1.14 mg/dL (ref 0.61–1.24)
GFR, Estimated: >60 mL/min (ref >60)
Glucose, Bld: 138 mg/dL — ABNORMAL HIGH (ref 70–99)
Potassium: 4.8 mmol/L (ref 3.5–5.1)
Sodium: 142 mmol/L (ref 135–145)

## 2022-09-14 LAB — URINE DRUGS OF ABUSE SCREEN W ALC, ROUTINE (REF LAB)
Amphetamines, Urine: NEGATIVE ng/mL
Barbiturate, Ur: NEGATIVE ng/mL
Benzodiazepine Quant, Ur: NEGATIVE ng/mL
Cannabinoid Quant, Ur: NEGATIVE ng/mL
Cocaine (Metab.): NEGATIVE ng/mL
Ethanol U, Quan: NEGATIVE %
Methadone Screen, Urine: NEGATIVE ng/mL
Opiate Quant, Ur: NEGATIVE ng/mL
Phencyclidine, Ur: NEGATIVE ng/mL
Propoxyphene, Urine: NEGATIVE ng/mL

## 2022-09-14 LAB — GLUCOSE, CAPILLARY
Glucose-Capillary: 117 mg/dL — ABNORMAL HIGH (ref 70–99)
Glucose-Capillary: 121 mg/dL — ABNORMAL HIGH (ref 70–99)
Glucose-Capillary: 121 mg/dL — ABNORMAL HIGH (ref 70–99)
Glucose-Capillary: 130 mg/dL — ABNORMAL HIGH (ref 70–99)
Glucose-Capillary: 131 mg/dL — ABNORMAL HIGH (ref 70–99)
Glucose-Capillary: 147 mg/dL — ABNORMAL HIGH (ref 70–99)
Glucose-Capillary: 156 mg/dL — ABNORMAL HIGH (ref 70–99)

## 2022-09-14 LAB — CBC
HCT: 39.3 % (ref 39.0–52.0)
Hemoglobin: 12 g/dL — ABNORMAL LOW (ref 13.0–17.0)
MCH: 24 pg — ABNORMAL LOW (ref 26.0–34.0)
MCHC: 30.5 g/dL (ref 30.0–36.0)
MCV: 78.6 fL — ABNORMAL LOW (ref 80.0–100.0)
Platelets: 197 10*3/uL (ref 150–400)
RBC: 5 MIL/uL (ref 4.22–5.81)
RDW: 21.5 % — ABNORMAL HIGH (ref 11.5–15.5)
WBC: 9 10*3/uL (ref 4.0–10.5)
nRBC: 0.6 % — ABNORMAL HIGH (ref 0.0–0.2)

## 2022-09-14 LAB — PROCALCITONIN: Procalcitonin: 0.17 ng/mL

## 2022-09-14 MED ORDER — VITAMIN B-12 1000 MCG PO TABS
1000.0000 ug | ORAL_TABLET | Freq: Every day | ORAL | Status: DC
  Administered 2022-09-14 – 2022-09-17 (×4): 1000 ug via ORAL
  Filled 2022-09-14 (×4): qty 1

## 2022-09-14 MED ORDER — PREDNISONE 20 MG PO TABS
40.0000 mg | ORAL_TABLET | Freq: Every day | ORAL | Status: AC
  Administered 2022-09-15 – 2022-09-17 (×3): 40 mg via ORAL
  Filled 2022-09-14 (×3): qty 2

## 2022-09-14 MED ORDER — INSULIN ASPART 100 UNIT/ML IJ SOLN
0.0000 [IU] | Freq: Three times a day (TID) | INTRAMUSCULAR | Status: DC
  Administered 2022-09-14: 2 [IU] via SUBCUTANEOUS
  Administered 2022-09-15 – 2022-09-17 (×5): 1 [IU] via SUBCUTANEOUS

## 2022-09-14 MED ORDER — CARVEDILOL 25 MG PO TABS
25.0000 mg | ORAL_TABLET | Freq: Two times a day (BID) | ORAL | Status: DC
  Administered 2022-09-14 – 2022-09-17 (×6): 25 mg via ORAL
  Filled 2022-09-14 (×6): qty 1

## 2022-09-14 MED ORDER — LOSARTAN POTASSIUM 25 MG PO TABS
25.0000 mg | ORAL_TABLET | Freq: Every day | ORAL | Status: DC
  Administered 2022-09-14 – 2022-09-17 (×4): 25 mg via ORAL
  Filled 2022-09-14 (×4): qty 1

## 2022-09-14 MED ORDER — TORSEMIDE 20 MG PO TABS
40.0000 mg | ORAL_TABLET | Freq: Every day | ORAL | Status: DC
  Administered 2022-09-14 – 2022-09-17 (×4): 40 mg via ORAL
  Filled 2022-09-14 (×4): qty 2

## 2022-09-14 MED ORDER — AMOXICILLIN-POT CLAVULANATE 875-125 MG PO TABS
1.0000 | ORAL_TABLET | Freq: Two times a day (BID) | ORAL | Status: AC
  Administered 2022-09-14 – 2022-09-16 (×6): 1 via ORAL
  Filled 2022-09-14 (×7): qty 1

## 2022-09-14 MED ORDER — ATORVASTATIN CALCIUM 10 MG PO TABS
10.0000 mg | ORAL_TABLET | Freq: Every day | ORAL | Status: DC
  Administered 2022-09-14 – 2022-09-17 (×4): 10 mg via ORAL
  Filled 2022-09-14 (×4): qty 1

## 2022-09-14 MED ORDER — MELATONIN 5 MG PO TABS: 10.0000 mg | ORAL_TABLET | Freq: Every day | ORAL | Status: DC | PRN

## 2022-09-14 MED ORDER — POLYSACCHARIDE IRON COMPLEX 150 MG PO CAPS
150.0000 mg | ORAL_CAPSULE | Freq: Every day | ORAL | Status: DC
  Administered 2022-09-14 – 2022-09-17 (×4): 150 mg via ORAL
  Filled 2022-09-14 (×4): qty 1

## 2022-09-14 NOTE — Progress Notes (Signed)
RT removed pt from BiPAP and placed pt on 4L [place]. Pt tolerated well with SVS. BiPAP is on stby in room. RT will continue to monitor pt.

## 2022-09-14 NOTE — Progress Notes (Signed)
Pt placed on BIPAP to rest. Pt resting comfortably on machine.

## 2022-09-14 NOTE — Progress Notes (Addendum)
Attending Note  Seen in f/u for likely COPD flare. Feeling better, almost back to baseline. Cannot find his house keys. Minimal wheezing on exam, does have LE edema. Was able to walk around unit. Continue steroids, nebs as ordered May be able to leave either later today or tomorrow depending on ability to afford meds and assuring f/u  Erskine Emery MD PCCM      NAME:  Edwin Mcclain, MRN:  [id number], DOB:  Aug 04, 1966, LOS: 2 ADMISSION DATE:  09/12/2022, CONSULTATION DATE:  9/23 REFERRING MD:  Dr. Pearline Cables, CHIEF COMPLAINT:  ARF w/ hypercarbia   History of Present Illness:  Patient is a 56 year old male with pertinent PMH of COPD, diastolic CHF, morbid obesity, OSA, HTN presents to [facility] ED on 9/23 with SOB.  Patient has been about home O2 for about 3 weeks.  Patient typically wears 3 LNC at home.  On 9/23 patient called EMS for worsening respiratory distress.  Upon EMS arrival patient sats in the 75s.  Patient placed on [place] with improvement.  Patient lethargic but somewhat arousable.  Patient transferred to [facility] ED.  On arrival to [facility] ED patient remains lethargic.  Complaining of SOB, chest pain, abdominal pain.  Vitals stable. BS w/ expiratory wheezing. Initial ABG 7.18, 104, 104, 39.  Patient placed on BiPAP.  Duonebs given. Improvement in ABG to 7.25, 91, 142, 40.  COVID/flu negative.  CXR with no significant findings.  Temp 99 F.  WBC 11.6.  BNP 427, D-dimer 1.34, troponin 50.  Creatinine 1.67, mildly elevated LFTs.  Lipase WNL.  Patient began to have worsening lethargy despite BiPAP.  Patient intubated for airway protection.  PCCM consulted for ICU admission.  Pertinent  Medical History   Past Medical History:  Diagnosis Date   Acute diastolic HF (heart failure) ([place]) 02/08/2019   Acute respiratory failure with hypoxia ([place]) 02/08/2019   CHF (congestive heart failure) (HCC)    COPD (chronic obstructive pulmonary disease) (HCC)    HTN (hypertension)    Morbid obesity ([place])       Significant Hospital Events: Including procedures, antibiotic start and stop dates in addition to other pertinent events   9/23: Admitted to [facility]; failed BiPAP trial and intubated for COPD exacerbation 9/24 extubated  9/25 No acute events overnight, remains on 3L [place]   Interim History / Subjective:  Seen sitting up in bedside recliner in NAD  States he feels well but is worried that his keys have been lost and reports he has no money for prescriptions at discharge.   Objective   Blood pressure 126/74, pulse 69, temperature 98.4 F (36.9 C), temperature source Oral, resp. rate 18, height 6\' 1"  (1.854 m), weight (!) 171 kg, SpO2 94 %.    FiO2 (%):  [40 %] 40 %   Intake/Output Summary (Last 24 hours) at 09/14/2022 1126 Last data filed at 09/14/2022 0800 Gross per 24 hour  Intake 766.63 ml  Output 1815 ml  Net -1048.37 ml    Filed Weights   09/12/22 2250 09/13/22 0500 09/14/22 0500  Weight: (!) 176.9 kg (!) 170.1 kg (!) 171 kg    Examination: General: Acute on chronically ill appearing elderly male lying in bed, in NAD HEENT: [place]/AT, MM pink/moist, PERRL,  Neuro: Alert and oriented x3, non-focal  CV: s1s2 regular rate and rhythm, no murmur, rubs, or gallops,  PULM:  Clear to ascultation bilaterally, on RA, no increased work of breathing  GI: soft, bowel sounds active in all 4 quadrants, non-tender, non-distended, tolerating oral  diet Extremities: warm/dry, no edema  Skin: no rashes or lesions  Resolved Hospital Problem list   Acute metabolic encephalopathy in the setting of hypercapnia  -Resolved once hypercapnia corrected  AKI likely due to cardiorenal syndrome  Assessment & Plan:  Acute on chronic respiratory failure with hypoxia and hypercarbia -Extubated 9/24  Right lower lobe pneumonia -Treated with empiric IV Azithromycin and Cefatrizine during admit  Acute COPD exacerbation  OHS/OSA -Uses 3L [place] continuously at baseline  -States he has a CPAP at home but does  not use it, reports he does not have a primary pulmonologist  P: Continue oral Augmentin for total of 5 days upon discharge  Pulmonary hygiene  Complete 5 day course of steroids on discharge  Continue home 3L [place]  Head of bed elevated 30 degrees Follow intermittent chest x-ray and ABG Follow cultures    Chronic diastolic CHF -ECHO 0/62 Left ventricular EF 60 to 65% with normal LV function and no WMA, Left ventricular diastolic parameters  are consistent with Grade II diastolic dysfunction (pseudonormalization).  Demand cardiac ischemia HTN HLD Elevated BNP and D-dimer Pulmonary emboli was ruled out with negative CT angiogram P: Resume home Statin, ASA, Coreg, Cozaar, and Demadex  Continuous telemetry  Heart health diet with sodium restriction when able  Strict intake and output  Daily weight to assess volume status Closely monitor renal function and electrolytes  Supplemental oxygen as above    Morbid obesity Dietitian follow-up  Best Practice (right click and "Reselect all SmartList Selections" daily)   Diet/type: NPO w/ meds via tube DVT prophylaxis: prophylactic heparin  GI prophylaxis: PPI Lines: N/A Foley:  Yes, and it is still needed Code Status:  full code Last date of multidisciplinary goals of care discussion [9/23 called sister Onalee Hua and updated over phone]  Critical care time: NA  Whitney D. Kenton Kingfisher, NP-C [facility] Personal contact information can be found on Amion  09/14/2022, 11:33 AM

## 2022-09-14 NOTE — Care Management (Signed)
  Transition of Care [facility]) Screening Note   Patient Details  Name: Edwin Mcclain Date of Birth: 1966/04/10   [facility] CM/SW Contact:    Bethena Roys, RN Phone Number: 09/14/2022, 4:23 PM    Transition of Care Department [facility]) has reviewed the patient. Case Manager received a message that the patient is unable to get into his boarding home- he has lost his keys and cannot get into his room. Case Manager trying to get contact information for the Landlord of the boarding home to see if he can aide the patient in getting into his room. Case Manager received notification that the patient does not have money to pay for medications; unfortunately, Case Manager cannot assist with medication co pays if the patient has insurance. MD and Staff RN aware of above information.

## 2022-09-15 DIAGNOSIS — J9622 Acute and chronic respiratory failure with hypercapnia: Secondary | ICD-10-CM

## 2022-09-15 DIAGNOSIS — I10 Essential (primary) hypertension: Secondary | ICD-10-CM

## 2022-09-15 DIAGNOSIS — J441 Chronic obstructive pulmonary disease with (acute) exacerbation: Secondary | ICD-10-CM

## 2022-09-15 DIAGNOSIS — I5032 Chronic diastolic (congestive) heart failure: Secondary | ICD-10-CM

## 2022-09-15 LAB — LEGIONELLA PNEUMOPHILA SEROGP 1 UR AG: L. pneumophila Serogp 1 Ur Ag: NEGATIVE

## 2022-09-15 LAB — GLUCOSE, CAPILLARY
Glucose-Capillary: 106 mg/dL — ABNORMAL HIGH (ref 70–99)
Glucose-Capillary: 114 mg/dL — ABNORMAL HIGH (ref 70–99)
Glucose-Capillary: 138 mg/dL — ABNORMAL HIGH (ref 70–99)
Glucose-Capillary: 131 mg/dL — ABNORMAL HIGH (ref 70–99)
Glucose-Capillary: 119 mg/dL — ABNORMAL HIGH (ref 70–99)

## 2022-09-15 NOTE — Progress Notes (Signed)
   09/15/22 1638  Mobility  Activity Ambulated with assistance in hallway  Level of Assistance Standby assist, set-up cues, supervision of patient - no hands on  Assistive Device Other (Comment) (Hand rails)  Distance Ambulated (ft) 150 ft  Activity Response Tolerated fair  $Mobility charge 1 Mobility   Mobility Specialist Progress Note  Pt was in bed and agreeable. X1 standing break d/t SOB. Returned to bed w/ all needs met and call bell in reach.   Lucious Groves Mobility Specialist

## 2022-09-15 NOTE — Assessment & Plan Note (Signed)
Continue bronchodilator therapy and systemic corticosteroids Airway clearing techniques with flutter valve and incentive spirometer.  Antibiotic therapy has been started on admission and currently patient on Augmentin

## 2022-09-15 NOTE — Hospital Course (Signed)
Edwin Mcclain was admitted to the hospital with the working diagnosis of COPD exacerbation.  56 yo male with the past medical history of COPD, heart failure, hypertension and obesity class 3 who presented with altered mental status. Patient called EMS due severe respiratory distress, he was found lethargic and hypoxic, he was placed on supplemental 02 per [place] and transported to the ED. In the emergency room he was placed on non invasive mechanical ventilation, which he unfortunately failed and had to be intubated and placed on invasive mechanical ventilation. Blood pressure 92/56, HR 72, RR 18 and 02 saturation 100%, lungs with wheezing and decreased breath sounds, heart with S1 and S2 present and rhythmic, abdomen with no tenderness, positive bilateral lower extremity edema.   ABG 7,18/ 104/ 104/ 42  Na 140, K 5,1 CL 96, bicarbonate 33, glucose 116 bun 24 cr 1,67  AST 65 ALT 51  BNP 427  High sensitive troponin 51 and 62  Wbc 11,6 hgb 13,4 plt 251   Chest radiograph with right rotation, cardiomegaly and bilateral hilar vascular congestion with cephalization of the vasculature CT chest abdomen and pelvis No pulmonary embolism. Bilateral atelectasis at bases.  No acute findings in the abdomen or pelvis.   EKG 89 bpm, right axis deviation, normal intervals, sinus rhythm with right atrial enlargement, with no significant ST segment or T wave changes.   Patient was placed on bronchodilator therapy and systemic corticosteroids.   09/24 liberated from invasive mechanical ventilation  09/25 using Bipap as needed  09/26 transferred to [facility].

## 2022-09-15 NOTE — Assessment & Plan Note (Addendum)
No clinical signs of acute exacerbation Continue close blood pressure monitoring.   Mild troponin elevation due to demand ischemia.   Medical therapy with losartan, carvedilol and diuresis with torsemide.

## 2022-09-15 NOTE — Assessment & Plan Note (Signed)
Calculated BMI is 52,3

## 2022-09-15 NOTE — Assessment & Plan Note (Addendum)
Continue blood pressure control with carvedilol. 

## 2022-09-15 NOTE — Assessment & Plan Note (Signed)
Hypoxemic and hypercapnic respiratory failure acute on chronic  Patient clinically is improving Continue supplemental 02 per [place] and continue with bipap at night He has a Cpap at home along with supplemental 02 per [place]  Out of bed to chair tid  PT and OT

## 2022-09-15 NOTE — Progress Notes (Signed)
Progress Note   Patient: Edwin Mcclain ZOX:[id number] DOB: 11/28/1966 DOA: 09/12/2022     3 DOS: the patient was seen and examined on 09/15/2022   Brief hospital course: Mr. Lasky was admitted to the hospital with the working diagnosis of COPD exacerbation.  56 yo male with the past medical history of COPD, heart failure, hypertension and obesity class 3 who presented with altered mental status. Patient called EMS due severe respiratory distress, he was found lethargic and hypoxic, he was placed on supplemental 02 per [place] and transported to the ED. In the emergency room he was placed on non invasive mechanical ventilation, which he unfortunately failed and had to be intubated and placed on invasive mechanical ventilation. Blood pressure 92/56, HR 72, RR 18 and 02 saturation 100%, lungs with wheezing and decreased breath sounds, heart with S1 and S2 present and rhythmic, abdomen with no tenderness, positive bilateral lower extremity edema.   ABG 7,18/ 104/ 104/ 42  Na 140, K 5,1 CL 96, bicarbonate 33, glucose 116 bun 24 cr 1,67  AST 65 ALT 51  BNP 427  High sensitive troponin 51 and 62  Wbc 11,6 hgb 13,4 plt 251   Chest radiograph with right rotation, cardiomegaly and bilateral hilar vascular congestion with cephalization of the vasculature CT chest abdomen and pelvis No pulmonary embolism. Bilateral atelectasis at bases.  No acute findings in the abdomen or pelvis.   EKG 89 bpm, right axis deviation, normal intervals, sinus rhythm with right atrial enlargement, with no significant ST segment or T wave changes.   Patient was placed on bronchodilator therapy and systemic corticosteroids.   09/24 liberated from invasive mechanical ventilation  09/25 using Bipap as needed  09/26 transferred to [facility].       Assessment and Plan: * Acute and chronic respiratory failure (acute-on-chronic) (HCC) Hypoxemic and hypercapnic respiratory failure acute on chronic  Patient clinically is  improving Continue supplemental 02 per [place] and continue with bipap at night He has a Cpap at home along with supplemental 02 per [place]  Out of bed to chair tid  PT and OT  Acute exacerbation of chronic obstructive pulmonary disease (COPD) (HCC) Continue bronchodilator therapy and systemic corticosteroids Airway clearing techniques with flutter valve and incentive spirometer.  Antibiotic therapy has been started on admission and currently patient on Augmentin   Chronic diastolic CHF (congestive heart failure) (HCC) No clinical signs of acute exacerbation Continue close blood pressure monitoring.   Mild troponin elevation due to demand ischemia.   Medical therapy with losartan, carvedilol and diuresis with torsemide.   HTN (hypertension) Continue blood pressure control with carvedilol and losartan.   Type 2 diabetes mellitus with hyperlipidemia (HCC) Continue glucose cover and monitoring with insulin sliding scale Capillary glucose 114, 138 and 131.   Continue with statin therapy.  Class 3 obesity (HCC) Calculated BMI is 52,3         Subjective: Patient with improvement in dyspnea no chest pain.   Physical Exam: Vitals:   09/14/22 1932 09/15/22 0341 09/15/22 0735 09/15/22 1109  BP: (!) 101/55 (!) 104/56 105/62 100/62  Pulse: 70 (!) 57 61 (!) 56  Resp: 15 17 15 20   Temp: 98 F (36.7 C) 98 F (36.7 C) 98.2 F (36.8 C) 98.3 F (36.8 C)  TempSrc: Oral Oral Oral Oral  SpO2: 93% 93% 100% 97%  Weight:  (!) 170.2 kg    Height:       Neurology awake and alert ENT with mild pallor Cardiovascular with S1  and S2 present and rhythmic Respiratory with no wheezing or rales Abdomen with no distention  Lower extremity with non pitting edema ++ with thick and dry skin  Data Reviewed:    Family Communication: no family at the bedside   Disposition: Status is: Inpatient Remains inpatient appropriate because: respiratory failure   Planned Discharge Destination:  Home   Author: Coralie Keens, MD 09/15/2022 5:11 PM  For on call review [email or web address].

## 2022-09-15 NOTE — Assessment & Plan Note (Signed)
Continue glucose cover and monitoring with insulin sliding scale Capillary glucose 114, 138 and 131.   Continue with statin therapy.

## 2022-09-16 LAB — BASIC METABOLIC PANEL
BUN: 21 mg/dL — ABNORMAL HIGH (ref 6–20)
CO2: >45 mmol/L — ABNORMAL HIGH (ref 22–32)
Calcium: 8.3 mg/dL — ABNORMAL LOW (ref 8.9–10.3)
Chloride: 89 mmol/L — ABNORMAL LOW (ref 98–111)
Creatinine, Ser: 1.24 mg/dL (ref 0.61–1.24)
GFR, Estimated: >60 mL/min (ref >60)
Glucose, Bld: 120 mg/dL — ABNORMAL HIGH (ref 70–99)
Potassium: 4.1 mmol/L (ref 3.5–5.1)
Sodium: 143 mmol/L (ref 135–145)

## 2022-09-16 LAB — CBC
HCT: 42.6 % (ref 39.0–52.0)
Hemoglobin: 12.3 g/dL — ABNORMAL LOW (ref 13.0–17.0)
MCH: 23.3 pg — ABNORMAL LOW (ref 26.0–34.0)
MCHC: 28.9 g/dL — ABNORMAL LOW (ref 30.0–36.0)
MCV: 80.8 fL (ref 80.0–100.0)
Platelets: 195 10*3/uL (ref 150–400)
RBC: 5.27 MIL/uL (ref 4.22–5.81)
RDW: 21.4 % — ABNORMAL HIGH (ref 11.5–15.5)
WBC: 7.5 10*3/uL (ref 4.0–10.5)
nRBC: 0 % (ref 0.0–0.2)

## 2022-09-16 LAB — GLUCOSE, CAPILLARY
Glucose-Capillary: 120 mg/dL — ABNORMAL HIGH (ref 70–99)
Glucose-Capillary: 117 mg/dL — ABNORMAL HIGH (ref 70–99)
Glucose-Capillary: 146 mg/dL — ABNORMAL HIGH (ref 70–99)
Glucose-Capillary: 142 mg/dL — ABNORMAL HIGH (ref 70–99)

## 2022-09-16 MED ORDER — SPIRONOLACTONE 25 MG PO TABS
25.0000 mg | ORAL_TABLET | Freq: Every day | ORAL | Status: DC
  Administered 2022-09-16 – 2022-09-17 (×2): 25 mg via ORAL
  Filled 2022-09-16 (×2): qty 1

## 2022-09-16 MED ORDER — EMPAGLIFLOZIN 10 MG PO TABS
10.0000 mg | ORAL_TABLET | Freq: Every day | ORAL | Status: DC
  Administered 2022-09-16 – 2022-09-17 (×2): 10 mg via ORAL
  Filled 2022-09-16 (×2): qty 1

## 2022-09-16 NOTE — Progress Notes (Signed)
PROGRESS NOTE    Jordon Trullinger  [id number] DOB: [birth year] DOA: 09/12/2022 PCP: [name], MD  56/M with history of obesity, COPD, chronic diastolic CHF, hypertension, OSA intolerant of CPAP presented to the ED with altered mental status and severe respiratory distress he was noted to be in respiratory failure with hypercarbia, failed BiPAP in the ED was intubated and admitted to the ICU.  Chest x-ray noted pulmonary vascular congestion, CT chest with no PE bibasilar atelectasis -Treated with diuretics bronchodilators and systemic steroids -Extubated 9/24 -Transferred to [facility] 9/26  Subjective: Feels better overall, breathing is improving, upset about missing his kays  Assessment and Plan:  Acute on chronic hypoxic and hypercarbic respiratory failure Acute on chronic diastolic CHF OSA OHS -Secondary to noncompliance with CPAP/BiPAP and CHF -Echo with preserved EF, grade 2 diastolic dysfunction -Volume status improving, he is 8 L negative -Continue torsemide and losartan -Add SGLT2i and Aldactone -BMP in a.m. -TOC consult for home BiPAP  COPD exacerbation -Improving, taper prednisone, Augmentin X 5 days  HTN (hypertension) Continue  carvedilol and losartan.   Type 2 diabetes mellitus with hyperlipidemia (HCC) -Stable, starting SGLT2i  Class 3 obesity (HCC) Calculated BMI is 52,3   OSA OHS -Importance of compliance with CPAP/BiPAP recommended  TOC consult -Lives in a boardinghouse, lost his keys in the hospital  DVT prophylaxis: Heparin subcutaneous Code Status: Full code Family Communication: Discussed with patient detail, no family at bedside Disposition Plan: Lives in a boardinghouse  Consultants:    Procedures:   Antimicrobials:    Objective: Vitals:   09/15/22 1109 09/15/22 1957 09/15/22 2243 09/16/22 0513  BP: 100/62 97/60  107/64  Pulse: (!) 56 (!) 56 65 66  Resp: 20 13 20 20   Temp: 98.3 F (36.8 C) 98.6 F (37 C)  98.4 F (36.9 C)   TempSrc: Oral Oral  Oral  SpO2: 97% 98% 98% 98%  Weight:    (!) 168.7 kg  Height:        Intake/Output Summary (Last 24 hours) at 09/16/2022 [id number] Last data filed at 09/16/2022 0300 Gross per 24 hour  Intake 594 ml  Output 2575 ml  Net -1981 ml   Filed Weights   09/14/22 1630 09/15/22 0341 09/16/22 0513  Weight: (!) 171.2 kg (!) 170.2 kg (!) 168.7 kg    Examination:  General exam: Obese chronically ill male sitting up in bed, AAOx3, no distress HEENT: Neck is unable to assess JVD CVS: S1-S2, regular rhythm Lungs: Decreased breath at the bases Abdomen: Soft, obese, nontender, bowel sounds present Extremities: Chronic skin changes and scaling, trace edema Skin: As above Psychiatry:  Mood & affect appropriate.     Data Reviewed:   CBC: Recent Labs  Lab 09/12/22 1933 09/12/22 2049 09/12/22 2347 09/13/22 0131 09/13/22 0416 09/14/22 0405 09/16/22 0055  WBC 11.6*  --   --  8.8  --  9.0 7.5  NEUTROABS 7.7  --   --   --   --   --   --   HGB 13.4   < > 14.6 12.1* 12.9* 12.0* 12.3*  HCT 44.7   < > 43.0 39.5 38.0* 39.3 42.6  MCV 79.7*  --   --  77.8*  --  78.6* 80.8  PLT 251  --   --  209  --  197 195   < > = values in this interval not displayed.   Basic Metabolic Panel: Recent Labs  Lab 09/12/22 2009 09/12/22 2049 09/12/22 2221 09/12/22 2347 09/13/22 0131  09/13/22 0416 09/14/22 0405 09/16/22 0055  NA 141   < > 141 138 140 139 142 143  K 5.1   < > 5.3* 4.5 4.2 3.6 4.8 4.1  CL 96*  --  95*  --  98  --  95* 89*  CO2 33*  --  36*  --  30  --  37* >45*  GLUCOSE 116*  --  132*  --  144*  --  138* 120*  BUN 24*  --  24*  --  26*  --  19 21*  CREATININE 1.67*  --  1.47*  --  1.37*  --  1.14 1.24  CALCIUM 8.6*  --  8.7*  --  8.4*  --  8.5* 8.3*  MG  --   --   --   --  2.3  --   --   --   PHOS  --   --   --   --  4.4  --   --   --    < > = values in this interval not displayed.   GFR: Estimated Creatinine Clearance: 108.6 mL/min (by C-G formula based on SCr of  1.24 mg/dL). Liver Function Tests: Recent Labs  Lab 09/12/22 2009 09/13/22 0131  AST 65* 53*  ALT 51* 47*  ALKPHOS 152* 123  BILITOT 1.4* 0.7  PROT 7.4 6.8  ALBUMIN 2.8* 2.6*   Recent Labs  Lab 09/12/22 2009  LIPASE 19   Recent Labs  Lab 09/13/22 0131  AMMONIA 44*   Coagulation Profile: No results for input(s): "INR", "PROTIME" in the last 168 hours. Cardiac Enzymes: No results for input(s): "CKTOTAL", "CKMB", "CKMBINDEX", "TROPONINI" in the last 168 hours. BNP (last 3 results) No results for input(s): "PROBNP" in the last 8760 hours. HbA1C: No results for input(s): "HGBA1C" in the last 72 hours. CBG: Recent Labs  Lab 09/15/22 0617 09/15/22 1101 09/15/22 1552 09/15/22 2109 09/16/22 0613  GLUCAP 114* 138* 131* 119* 120*   Lipid Profile: No results for input(s): "CHOL", "HDL", "LDLCALC", "TRIG", "CHOLHDL", "LDLDIRECT" in the last 72 hours. Thyroid Function Tests: No results for input(s): "TSH", "T4TOTAL", "FREET4", "T3FREE", "THYROIDAB" in the last 72 hours. Anemia Panel: No results for input(s): "VITAMINB12", "FOLATE", "FERRITIN", "TIBC", "IRON", "RETICCTPCT" in the last 72 hours. Urine analysis:    Component Value Date/Time   COLORURINE YELLOW 09/13/2022 0256   APPEARANCEUR CLEAR 09/13/2022 0256   LABSPEC >1.046 (H) 09/13/2022 0256   PHURINE 5.0 09/13/2022 0256   GLUCOSEU NEGATIVE 09/13/2022 0256   HGBUR SMALL (A) 09/13/2022 0256   BILIRUBINUR NEGATIVE 09/13/2022 0256   KETONESUR 5 (A) 09/13/2022 0256   PROTEINUR NEGATIVE 09/13/2022 0256   NITRITE NEGATIVE 09/13/2022 0256   LEUKOCYTESUR NEGATIVE 09/13/2022 0256   Sepsis Labs: @LABRCNTIP (procalcitonin:4,lacticidven:4)  ) Recent Results (from the past 240 hour(s))  Resp Panel by RT-PCR (Flu A&B, Covid) Anterior Nasal Swab     Status: None   Collection Time: 09/12/22  7:10 PM   Specimen: Anterior Nasal Swab  Result Value Ref Range Status   SARS Coronavirus 2 by RT PCR NEGATIVE NEGATIVE Final     Comment: (NOTE) SARS-CoV-2 target nucleic acids are NOT DETECTED.  The SARS-CoV-2 RNA is generally detectable in upper respiratory specimens during the acute phase of infection. The lowest concentration of SARS-CoV-2 viral copies this assay can detect is 138 copies/mL. A negative result does not preclude SARS-Cov-2 infection and should not be used as the sole basis for treatment or other patient management decisions. A  negative result may occur with  improper specimen collection/handling, submission of specimen other than nasopharyngeal swab, presence of viral mutation(s) within the areas targeted by this assay, and inadequate number of viral copies(<138 copies/mL). A negative result must be combined with clinical observations, patient history, and epidemiological information. The expected result is Negative.  Fact Sheet for Patients:  BloggerCourse.comhttps://www.fda.gov/media/152166/download  Fact Sheet for Healthcare Providers:  SeriousBroker.ithttps://www.fda.gov/media/152162/download  This test is no t yet approved or cleared by the [place] FDA and  has been authorized for detection and/or diagnosis of SARS-CoV-2 by FDA under an Emergency Use Authorization (EUA). This EUA will remain  in effect (meaning this test can be used) for the duration of the COVID-19 declaration under Section 564(b)(1) of the Act, 21 U.S.C.section 360bbb-3(b)(1), unless the authorization is terminated  or revoked sooner.       Influenza A by PCR NEGATIVE NEGATIVE Final   Influenza B by PCR NEGATIVE NEGATIVE Final    Comment: (NOTE) The Xpert Xpress SARS-CoV-2/FLU/RSV plus assay is intended as an aid in the diagnosis of influenza from Nasopharyngeal swab specimens and should not be used as a sole basis for treatment. Nasal washings and aspirates are unacceptable for Xpert Xpress SARS-CoV-2/FLU/RSV testing.  Fact Sheet for Patients: BloggerCourse.comhttps://www.fda.gov/media/152166/download  Fact Sheet for Healthcare  Providers: SeriousBroker.ithttps://www.fda.gov/media/152162/download  This test is not yet approved or cleared by the [place] FDA and has been authorized for detection and/or diagnosis of SARS-CoV-2 by FDA under an Emergency Use Authorization (EUA). This EUA will remain in effect (meaning this test can be used) for the duration of the COVID-19 declaration under Section 564(b)(1) of the Act, 21 U.S.C. section 360bbb-3(b)(1), unless the authorization is terminated or revoked.  Performed at [facility], [address] [place], [place] [address]   Respiratory (~20 pathogens) panel by PCR     Status: None   Collection Time: 09/12/22  7:10 PM   Specimen: Nasopharyngeal Swab; Respiratory  Result Value Ref Range Status   Adenovirus NOT DETECTED NOT DETECTED Final   Coronavirus 229E NOT DETECTED NOT DETECTED Final    Comment: (NOTE) The Coronavirus on the Respiratory Panel, DOES NOT test for the novel  Coronavirus (2019 nCoV)    Coronavirus HKU1 NOT DETECTED NOT DETECTED Final   Coronavirus NL63 NOT DETECTED NOT DETECTED Final   Coronavirus OC43 NOT DETECTED NOT DETECTED Final   Metapneumovirus NOT DETECTED NOT DETECTED Final   Rhinovirus / Enterovirus NOT DETECTED NOT DETECTED Final   Influenza A NOT DETECTED NOT DETECTED Final   Influenza B NOT DETECTED NOT DETECTED Final   Parainfluenza Virus 1 NOT DETECTED NOT DETECTED Final   Parainfluenza Virus 2 NOT DETECTED NOT DETECTED Final   Parainfluenza Virus 3 NOT DETECTED NOT DETECTED Final   Parainfluenza Virus 4 NOT DETECTED NOT DETECTED Final   Respiratory Syncytial Virus NOT DETECTED NOT DETECTED Final   Bordetella pertussis NOT DETECTED NOT DETECTED Final   Bordetella Parapertussis NOT DETECTED NOT DETECTED Final   Chlamydophila pneumoniae NOT DETECTED NOT DETECTED Final   Mycoplasma pneumoniae NOT DETECTED NOT DETECTED Final    Comment: Performed at [facility], [address] [place], [place] [address]  Culture, blood  (Routine X 2) w Reflex to ID Panel     Status: None (Preliminary result)   Collection Time: 09/13/22  1:50 AM   Specimen: BLOOD  Result Value Ref Range Status   Specimen Description BLOOD RIGHT ANTECUBITAL  Final   Special Requests   Final  BOTTLES DRAWN AEROBIC AND ANAEROBIC Blood Culture adequate volume   Culture   Final    NO GROWTH 3 DAYS Performed at [facility], [place] [address], [place], [place] [address]    Report Status PENDING  Incomplete  Culture, blood (Routine X 2) w Reflex to ID Panel     Status: None (Preliminary result)   Collection Time: 09/13/22  1:50 AM   Specimen: BLOOD  Result Value Ref Range Status   Specimen Description BLOOD RIGHT ANTECUBITAL  Final   Special Requests   Final    BOTTLES DRAWN AEROBIC AND ANAEROBIC Blood Culture adequate volume   Culture   Final    NO GROWTH 3 DAYS Performed at [facility], [place] [address] [place], [place] [address]    Report Status PENDING  Incomplete  MRSA Next Gen by PCR, Nasal     Status: None   Collection Time: 09/13/22  2:00 AM  Result Value Ref Range Status   MRSA by PCR Next Gen NOT DETECTED NOT DETECTED Final    Comment: (NOTE) The GeneXpert MRSA Assay (FDA approved for NASAL specimens only), is one component of a comprehensive MRSA colonization surveillance program. It is not intended to diagnose MRSA infection nor to guide or monitor treatment for MRSA infections. Test performance is not FDA approved in patients less than 18 years old. Performed at [facility], [place] [address] [place], [place] [address]   Urine Culture     Status: None   Collection Time: 09/13/22  2:56 AM   Specimen: Urine, Clean Catch  Result Value Ref Range Status   Specimen Description URINE, CLEAN CATCH  Final   Special Requests NONE  Final   Culture   Final    NO GROWTH Performed at [facility], [place] [address], [place],  [address]    Report Status 09/14/2022 FINAL  Final     Radiology  Studies: No results found.   Scheduled Meds:  amoxicillin-clavulanate  1 tablet Oral Q12H   aspirin  81 mg Per Tube Daily   atorvastatin  10 mg Oral Daily   carvedilol  25 mg Oral BID WC   Chlorhexidine Gluconate Cloth  6 each Topical Daily   cyanocobalamin  1,000 mcg Oral Daily   docusate  100 mg Per Tube BID   empagliflozin  10 mg Oral Daily   heparin  5,000 Units Subcutaneous Q8H   insulin aspart  0-9 Units Subcutaneous TID AC & HS   iron polysaccharides  150 mg Oral Daily   losartan  25 mg Oral Daily   pantoprazole  40 mg Per Tube Daily   polyethylene glycol  17 g Per Tube Daily   predniSONE  40 mg Oral Q breakfast   spironolactone  25 mg Oral Daily   torsemide  40 mg Oral Daily   Continuous Infusions:   LOS: 4 days    Time spent: 62min    Domenic Polite, MD [facility]   09/16/2022, 9:52 AM

## 2022-09-16 NOTE — Progress Notes (Signed)
Pt placed on BIPAP for night rest.  Tolerating well. 

## 2022-09-16 NOTE — Progress Notes (Signed)
PT Cancellation Note  Patient Details Name: Edwin Mcclain MRN: [id number] DOB: 1966-10-23   Cancelled Treatment:    Reason Eval/Treat Not Completed: Other (comment).  Screened for PT needs, and has been walking with MT after having been evaluated and found independent to walk distances.  Will sign off from PT as pt is already mobile with techs managing his walking.  O2 is going to be required.   Ramond Dial 09/16/2022, 9:39 AM  Mee Hives, PT PhD Acute Rehab Dept. Number: [place]

## 2022-09-16 NOTE — Evaluation (Signed)
Occupational Therapy Evaluation Patient Details Name: Edwin Mcclain MRN: [id number] DOB: 1966/07/13 Today's Date: 09/16/2022   History of Present Illness Pt is a 56 y.o. male admitted 09/12/22 with SOB, lethargy, chest pain; pt reports out of home O2 the past 3 weeks. CXR without acute finding. Workup for severe acute on chronic respiratory acidosis. Worsening lethargy despite BiPAP; ETT 9/23-9/24. PMH includes COPD (wears 3L O2 baseline), CHF, OSA, HTN, morbid obesity.   Clinical Impression   Pt independent at baseline with ADLs and functional mobility, lives in a boarding house and has PRN assist. Pt currently performing ADLs with mod I-min A, mod I for bed mobility, and min guard for transfers without AD. Pt with mild unsteadiness/decreased safety awareness, bumping into things on R side x2 during session. VSS on 3L O2 throughout, pt 97% pre-ambulation, and 94% post ambulation. Pt presenting with impairments listed below, will follow acutely. Recommend HHOT at d/c.     Recommendations for follow up therapy are one component of a multi-disciplinary discharge planning process, led by the attending physician.  Recommendations may be updated based on patient status, additional functional criteria and insurance authorization.   Follow Up Recommendations  Home health OT    Assistance Recommended at Discharge Set up Supervision/Assistance  Patient can return home with the following A little help with bathing/dressing/bathroom;Assistance with cooking/housework;Direct supervision/assist for medications management;Direct supervision/assist for financial management;Assist for transportation;Help with stairs or ramp for entrance    Functional Status Assessment  Patient has had a recent decline in their functional status and demonstrates the ability to make significant improvements in function in a reasonable and predictable amount of time.  Equipment Recommendations  None recommended by OT     Recommendations for Other Services       Precautions / Restrictions Precautions Precautions: Other (comment) Precaution Comments: Watch SpO2 (wears 3L O2 baseline) Restrictions Weight Bearing Restrictions: No      Mobility Bed Mobility Overal bed mobility: Modified Independent                  Transfers Overall transfer level: Needs assistance Equipment used: None Transfers: Sit to/from Stand Sit to Stand: Min guard                  Balance Overall balance assessment: Mild deficits observed, not formally tested                                         ADL either performed or assessed with clinical judgement   ADL Overall ADL's : Needs assistance/impaired Eating/Feeding: Modified independent   Grooming: Modified independent   Upper Body Bathing: Minimal assistance   Lower Body Bathing: Minimal assistance   Upper Body Dressing : Minimal assistance Upper Body Dressing Details (indicate cue type and reason): donning gown Lower Body Dressing: Min guard Lower Body Dressing Details (indicate cue type and reason): donning shoes Toilet Transfer: Min guard;Ambulation;Regular Glass blower/designer Details (indicate cue type and reason): simulated via functional mobility Toileting- Clothing Manipulation and Hygiene: Modified independent Toileting - Clothing Manipulation Details (indicate cue type and reason): use of urinal     Functional mobility during ADLs: Min guard       Vision Patient Visual Report: No change from baseline Additional Comments: reports he is supposed to wear glasses, but he does not     Perception     Praxis  Pertinent Vitals/Pain Pain Assessment Pain Assessment: No/denies pain     Hand Dominance     Extremity/Trunk Assessment Upper Extremity Assessment Upper Extremity Assessment: Overall WFL for tasks assessed   Lower Extremity Assessment Lower Extremity Assessment: Defer to PT evaluation    Cervical / Trunk Assessment Cervical / Trunk Assessment: Normal   Communication Communication Communication: No difficulties   Cognition Arousal/Alertness: Awake/alert Behavior During Therapy: WFL for tasks assessed/performed Overall Cognitive Status: No family/caregiver present to determine baseline cognitive functioning                                 General Comments: suspect baseline cognition, some decreased awareness, follows commands appropriately     General Comments  VSS on 3L O2, SpO2 97% pre ambulation, 94% post ambulation    Exercises     Shoulder Instructions      Home Living Family/patient expects to be discharged to:: Private residence Living Arrangements: Non-relatives/Friends (boarding house) Available Help at Discharge: Friend(s);Available PRN/intermittently Type of Home: House Home Access: Stairs to enter Entrance Stairs-Number of Steps: 1 step   Home Layout: Two level;Able to live on main level with bedroom/bathroom     Bathroom Shower/Tub: Chief Strategy Officer: Standard     Home Equipment: None   Additional Comments: wears 3L O2 at home      Prior Functioning/Environment Prior Level of Function : Independent/Modified Independent;Driving             Mobility Comments: Independent without DME; reports primarily sedentary, enjoys being alone. Can drive but does not drive, friends provide transportation ADLs Comments: ind with ADLs and IADLs        OT Problem List: Decreased activity tolerance;Impaired balance (sitting and/or standing);Decreased safety awareness      OT Treatment/Interventions: Self-care/ADL training;Therapeutic exercise;DME and/or AE instruction;Energy conservation;Therapeutic activities;Patient/family education;Balance training    OT Goals(Current goals can be found in the care plan section) Acute Rehab OT Goals Patient Stated Goal: none stated OT Goal Formulation: With patient Time For  Goal Achievement: 09/30/22 Potential to Achieve Goals: Good ADL Goals Pt Will Perform Grooming: Independently;standing Pt Will Perform Upper Body Dressing: Independently;sitting;standing Pt Will Perform Lower Body Dressing: Independently;sit to/from stand;sitting/lateral leans Pt Will Transfer to Toilet: Independently;regular height toilet;ambulating Pt Will Perform Tub/Shower Transfer: Tub transfer;Shower transfer;ambulating  OT Frequency: Min 2X/week    Co-evaluation              AM-PAC OT "6 Clicks" Daily Activity     Outcome Measure Help from another person eating meals?: None Help from another person taking care of personal grooming?: A Little Help from another person toileting, which includes using toliet, bedpan, or urinal?: A Little Help from another person bathing (including washing, rinsing, drying)?: A Little Help from another person to put on and taking off regular upper body clothing?: A Little Help from another person to put on and taking off regular lower body clothing?: A Little 6 Click Score: 19   End of Session Equipment Utilized During Treatment: Oxygen Nurse Communication: Mobility status  Activity Tolerance: Patient tolerated treatment well Patient left: in bed;with call bell/phone within reach (seated EOB)  OT Visit Diagnosis: Unsteadiness on feet (R26.81);Other abnormalities of gait and mobility (R26.89);Muscle weakness (generalized) (M62.81)                Time: 4696-2952 OT Time Calculation (min): 21 min Charges:  OT General Charges $OT Visit: 1 Visit  OT Evaluation $OT Eval Low Complexity: 1 Low  Alfonzo Beers, OTD, OTR/L Acute Rehab [phone]  Mayer Masker 09/16/2022, 12:39 PM

## 2022-09-16 NOTE — Progress Notes (Signed)
   09/16/22 1545  Clinical Encounter Type  Visited With Patient  Visit Type Initial;Spiritual support  Referral From Physician;Nurse (Dr. Domenic Polite, MD; Sherle Poe, RN)  Consult/Referral To Chaplain Melvenia Beam)  Spiritual Encounters  Spiritual Needs Emotional;Prayer   Chaplain met with Mr. Elber Galyean at patient's bedside. Provided meaningful presence, reflective listening and emotional support.\ Mr. Kocak has expressed concerns regarding clothing and home keys that were lost some where between home and hospital admission. Chaplain provided patient with copy of Bible per his request and reached out to nurse that stated security and case manager are involved in locating his keys. Chaplain will attempt to follow up with patient on Saturday. [address] [place], [place] [phone]

## 2022-09-17 ENCOUNTER — Other Ambulatory Visit (HOSPITAL_COMMUNITY): Payer: Self-pay

## 2022-09-17 LAB — GLUCOSE, CAPILLARY: Glucose-Capillary: 131 mg/dL — ABNORMAL HIGH (ref 70–99)

## 2022-09-17 LAB — BASIC METABOLIC PANEL
BUN: 19 mg/dL (ref 6–20)
CO2: >45 mmol/L — ABNORMAL HIGH (ref 22–32)
Calcium: 8.8 mg/dL — ABNORMAL LOW (ref 8.9–10.3)
Chloride: 87 mmol/L — ABNORMAL LOW (ref 98–111)
Creatinine, Ser: 1.3 mg/dL — ABNORMAL HIGH (ref 0.61–1.24)
GFR, Estimated: >60 mL/min (ref >60)
Glucose, Bld: 117 mg/dL — ABNORMAL HIGH (ref 70–99)
Potassium: 4.2 mmol/L (ref 3.5–5.1)
Sodium: 142 mmol/L (ref 135–145)

## 2022-09-17 MED ORDER — PREDNISONE 20 MG PO TABS
20.0000 mg | ORAL_TABLET | Freq: Every day | ORAL | 0 refills | Status: AC
  Filled 2022-09-17: qty 2, 2d supply, fill #0

## 2022-09-17 MED ORDER — SPIRONOLACTONE 25 MG PO TABS
25.0000 mg | ORAL_TABLET | Freq: Every day | ORAL | 0 refills | Status: DC
  Filled 2022-09-17: qty 30, 30d supply, fill #0

## 2022-09-17 NOTE — Discharge Summary (Signed)
Physician Discharge Summary  Edwin Mcclain [id number] DOB: 26-Nov-1966 DOA: 09/12/2022  PCP: [name], MD  Admit date: 09/12/2022 Discharge date: 09/17/2022  Time spent: 35 minutes  Recommendations for Outpatient Follow-up:  Cardiology Dr. Percival Spanish in 2 to 3 weeks PCP Dr. 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) (HCC) Acute on chronic diastolic CHF OSA, OHS   Acute exacerbation of chronic obstructive pulmonary disease (COPD) ([place])   Chronic diastolic CHF (congestive heart failure) ([place])   HTN (hypertension)   Type 2 diabetes mellitus with hyperlipidemia ([place])   Class 3 obesity ([place])   Discharge Condition: Improved  Diet recommendation: Low sodium, heart healthy  Filed Weights   09/15/22 0341 09/16/22 0513 09/17/22 0610  Weight: (!) 170.2 kg (!) 168.7 kg (!) 167.8 kg    History of present illness:  56/M with history of obesity, COPD, chronic diastolic CHF, hypertension, OSA intolerant of CPAP presented to the ED with altered mental status and severe respiratory distress he was noted to be in respiratory failure with hypercarbia, failed BiPAP in the ED was intubated and admitted to the ICU.  Chest x-ray noted pulmonary vascular congestion, CT chest with no PE bibasilar atelectasis -Treated with diuretics bronchodilators and systemic steroids -Extubated 9/24 -Transferred to [facility] 9/26  Hospital Course:   Acute on chronic hypoxic and hypercarbic respiratory failure Acute on chronic diastolic CHF OSA OHS -Secondary to noncompliance with CPAP/BiPAP and CHF -Echo with preserved EF, grade 2 diastolic dysfunction -Volume status improving, he is 9.3L negative -Continue torsemide and losartan -started on SGLT2i and Aldactone -Discharged home in a stable condition, follow-up with Dr. Percival Spanish -TOC consulted for home BiPAP and compliance  emphasized   COPD exacerbation -Improving, taper prednisone, Augmentin X 5 days completed   HTN (hypertension) Continue  carvedilol and losartan.    Type 2 diabetes mellitus with hyperlipidemia (HCC) -Stable, started SGLT2i   Class 3 obesity ([place]) Calculated BMI is 52,3    OSA OHS -Importance of compliance with CPAP/BiPAP recommended   TOC consult -Lives in a boardinghouse, lost his keys in the hospital    Discharge Exam: Vitals:   09/17/22 0610 09/17/22 0744  BP: (!) 161/98 129/72  Pulse: (!) 55 61  Resp: (!) 9 13  Temp: 98.3 F (36.8 C) 98.6 F (37 C)  SpO2: 100% 99%    General exam: Obese chronically ill male sitting up in bed, AAOx3, no distress HEENT: Neck is unable to assess JVD CVS: S1-S2, regular rhythm Lungs: Decreased breath at the bases Abdomen: Soft, obese, nontender, bowel sounds present Extremities: Chronic skin changes and scaling, trace edema Skin: As above Psychiatry:  Mood & affect appropriate.    Discharge Instructions   Discharge Instructions     Diet - low sodium heart healthy   Complete by: As directed    Increase activity slowly   Complete by: As directed       Allergies as of 09/17/2022       Reactions   Metformin And Related Diarrhea        Medication List     TAKE these medications    Accu-Chek Guide test strip Generic drug: glucose blood Use to check blood sugar once daily.   Accu-Chek Softclix Lancets lancets Use to check blood sugar once daily.   aspirin EC 81 MG tablet Take 1 tablet (81 mg total) by mouth daily.   atorvastatin 10 MG  tablet Commonly known as: LIPITOR Take 1 tablet (10 mg total) by mouth daily.   carvedilol 25 MG tablet Commonly known as: COREG Take 1 tablet (25 mg total) by mouth 2 (two) times daily with a meal.   cyanocobalamin 1000 MCG tablet Take 1 tablet (1,000 mcg total) by mouth daily.   Farxiga 10 MG Tabs tablet Generic drug: dapagliflozin propanediol Take 1 tablet (10 mg  total) by mouth daily before breakfast.   iron polysaccharides 150 MG capsule Commonly known as: NIFEREX Take 150 mg by mouth daily.   losartan 25 MG tablet Commonly known as: COZAAR TAKE 1 TABLET(25 MG) BY MOUTH DAILY What changed:  how much to take how to take this when to take this additional instructions   Melatonin 10 MG Tabs Take 1 tablet by mouth daily as needed (sleep).   predniSONE 20 MG tablet Commonly known as: DELTASONE Take 1 tablet (20 mg total) by mouth daily with breakfast for 2 days.   sitaGLIPtin 25 MG tablet Commonly known as: JANUVIA Take 1 tablet (25 mg total) by mouth daily.   spironolactone 25 MG tablet Commonly known as: ALDACTONE Take 1 tablet (25 mg total) by mouth daily.   torsemide 20 MG tablet Commonly known as: DEMADEX Take 2 tablets (40 mg total) by mouth daily.       Allergies  Allergen Reactions   Metformin And Related Diarrhea    Follow-up Information     [name], MD Follow up.   Specialty: Family Medicine Why: The office will call patient. Contact information: [place] [address] [phone]         Minus Breeding, MD .   Specialty: Cardiology Contact information: [address] [place] [address] [phone]         [place]. Go in 11 day(s).   Specialty: Cardiology Why: Hospital follow up PLEASE bring a current medication list to appointment FREE valet parking, Entrance C, Off [address] information: [address] [id number] [place] [phone]                 The results of significant diagnostics from this hospitalization (including imaging, microbiology, ancillary and laboratory) are listed below for reference.    Significant Diagnostic Studies: VAS [place] LOWER EXTREMITY VENOUS (DVT)  Result Date: 09/14/2022  Lower Venous DVT Study Patient  Name:  Edwin Mcclain Edwin Mcclain  Date of Exam:   09/13/2022 Medical Rec #: [id number]     Accession #:    [id number] Date of Birth: 1966/07/23     Patient Gender: M Patient Age:   56 years Exam Location[facility] Procedure:      VAS [place] LOWER EXTREMITY VENOUS (DVT) Referring Phys: Jenny Reichmann PAYNE --------------------------------------------------------------------------------  Indications: Edema, and SOB.  Risk Factors: OSA, on home oxygen, has been out for 3 weeks. Limitations: Body habitus (BMI 49.48). Woody edema. Comparison Study: Prior negative bilateral LEV done 04/03/21 Performing Technologist: Sharion Dove RVS  Examination Guidelines: A complete evaluation includes B-mode imaging, spectral Doppler, color Doppler, and power Doppler as needed of all accessible portions of each vessel. Bilateral testing is considered an integral part of a complete examination. Limited examinations for reoccurring indications may be performed as noted. The reflux portion of the exam is performed with the patient in reverse Trendelenburg.  +---------+---------------+---------+-----------+----------+-------------------+ RIGHT    CompressibilityPhasicitySpontaneityPropertiesThrombus Aging      +---------+---------------+---------+-----------+----------+-------------------+ CFV  Full           Yes      Yes                                      +---------+---------------+---------+-----------+----------+-------------------+ SFJ      Full                                                             +---------+---------------+---------+-----------+----------+-------------------+ FV Prox  Full                                                             +---------+---------------+---------+-----------+----------+-------------------+ FV Mid   Full                                                             +---------+---------------+---------+-----------+----------+-------------------+ FV DistalFull                                                              +---------+---------------+---------+-----------+----------+-------------------+ PFV      Full                                                             +---------+---------------+---------+-----------+----------+-------------------+ POP      Full           Yes      Yes                                      +---------+---------------+---------+-----------+----------+-------------------+ PTV                                                   Not well visualized +---------+---------------+---------+-----------+----------+-------------------+ PERO                                                  Not well visualized +---------+---------------+---------+-----------+----------+-------------------+   +---------+---------------+---------+-----------+----------+-------------------+ LEFT     CompressibilityPhasicitySpontaneityPropertiesThrombus Aging      +---------+---------------+---------+-----------+----------+-------------------+ CFV      Full           Yes      Yes                                      +---------+---------------+---------+-----------+----------+-------------------+  SFJ      Full                                                             +---------+---------------+---------+-----------+----------+-------------------+ FV Prox  Full                                                             +---------+---------------+---------+-----------+----------+-------------------+ FV Mid   Full                                                             +---------+---------------+---------+-----------+----------+-------------------+ FV DistalFull                                                             +---------+---------------+---------+-----------+----------+-------------------+ PFV      Full                                                              +---------+---------------+---------+-----------+----------+-------------------+ POP      Full           Yes      Yes                                      +---------+---------------+---------+-----------+----------+-------------------+ PTV                                                   Not well visualized +---------+---------------+---------+-----------+----------+-------------------+ PERO                                                  Not well visualized +---------+---------------+---------+-----------+----------+-------------------+     Summary: BILATERAL: -No evidence of popliteal cyst, bilaterally. RIGHT: - There is no evidence of deep vein thrombosis in the lower extremity. However, portions of this examination were limited- see technologist comments above.  LEFT: - There is no evidence of deep vein thrombosis in the lower extremity. However, portions of this examination were limited- see technologist comments above.  *See table(s) above for measurements and observations. Electronically signed by Heath Lark on 09/14/2022 at 11:14:09 AM.    Final    ECHOCARDIOGRAM COMPLETE  Result Date: 09/13/2022    ECHOCARDIOGRAM  REPORT   Patient Name:   [facility] Mcclain Roddey Date of Exam: 09/13/2022 Medical Rec #:  [id number]    Height:       73.0 in Accession #:    [id number]   Weight:       375.0 lb Date of Birth:  Oct 29, 1966    BSA:          2.811 m Patient Age:    9 years     BP:           95/53 mmHg Patient Gender: M            HR:           64 bpm. Exam Location:  Inpatient Procedure: 2D Echo, Cardiac Doppler, Color Doppler and Intracardiac            Opacification Agent Indications:    Acute Respiratory Distress R06.03  History:        Patient has prior history of Echocardiogram examinations, most                 recent 04/03/2021. CHF, COPD; Risk Factors:Hypertension, Diabetes                 and Sleep Apnea.  Sonographer:    Raquel Sarna Senior RDCS Referring Phys: [phone] [facility]  Sonographer  Comments: Technically difficult due to patient body habitus, scanned supine on artificial respirator. IMPRESSIONS  1. Left ventricular ejection fraction, by estimation, is 60 to 65%. The left ventricle has normal function. The left ventricle has no regional wall motion abnormalities. There is mild left ventricular hypertrophy. Left ventricular diastolic parameters are consistent with Grade II diastolic dysfunction (pseudonormalization).  2. Right ventricular systolic function is normal. The right ventricular size is normal.  3. The mitral valve is normal in structure. No evidence of mitral valve regurgitation. No evidence of mitral stenosis.  4. The aortic valve is tricuspid. Aortic valve regurgitation is not visualized. No aortic stenosis is present.  5. The inferior vena cava is normal in size with greater than 50% respiratory variability, suggesting right atrial pressure of 3 mmHg. FINDINGS  Left Ventricle: Left ventricular ejection fraction, by estimation, is 60 to 65%. The left ventricle has normal function. The left ventricle has no regional wall motion abnormalities. Definity contrast agent was given IV to delineate the left ventricular  endocardial borders. The left ventricular internal cavity size was normal in size. There is mild left ventricular hypertrophy. Left ventricular diastolic parameters are consistent with Grade II diastolic dysfunction (pseudonormalization). Right Ventricle: The right ventricular size is normal. No increase in right ventricular wall thickness. Right ventricular systolic function is normal. Left Atrium: Left atrial size was normal in size. Right Atrium: Right atrial size was normal in size. Pericardium: There is no evidence of pericardial effusion. Mitral Valve: The mitral valve is normal in structure. No evidence of mitral valve regurgitation. No evidence of mitral valve stenosis. Tricuspid Valve: The tricuspid valve is normal in structure. Tricuspid valve regurgitation is not  demonstrated. No evidence of tricuspid stenosis. Aortic Valve: The aortic valve is tricuspid. Aortic valve regurgitation is not visualized. No aortic stenosis is present. Pulmonic Valve: The pulmonic valve was normal in structure. Pulmonic valve regurgitation is not visualized. No evidence of pulmonic stenosis. Aorta: The aortic root is normal in size and structure. Venous: The inferior vena cava is normal in size with greater than 50% respiratory variability, suggesting right atrial pressure of 3 mmHg. IAS/Shunts: The interatrial septum was not well visualized.  LEFT VENTRICLE PLAX 2D LVIDd:         4.90 cm   Diastology LVIDs:         2.80 cm   LV e' medial:    0.04 cm/s LV PW:         1.00 cm   LV E/e' medial:  21.7 LV IVS:        1.40 cm   LV e' lateral:   0.05 cm/s LVOT diam:     2.30 cm   LV E/e' lateral: 19.8 LV SV:         104 LV SV Index:   37 LVOT Area:     4.15 cm  RIGHT VENTRICLE RV S prime:     14.60 cm/s TAPSE (M-mode): 2.0 cm LEFT ATRIUM            Index        RIGHT ATRIUM           Index LA diam:      3.10 cm  1.10 cm/m   RA Area:     23.10 cm LA Vol (A4C): 123.0 ml 43.76 ml/m  RA Volume:   68.20 ml  24.27 ml/m  AORTIC VALVE LVOT Vmax:   113.00 cm/s LVOT Vmean:  79.800 cm/s LVOT VTI:    0.251 m  AORTA Ao Root diam: 3.20 cm Ao Asc diam:  3.60 cm MITRAL VALVE MV Area (PHT): 2.63 cm    SHUNTS MV Decel Time: 288 msec    Systemic VTI:  0.25 m MV E velocity: 0.92 cm/s   Systemic Diam: 2.30 cm MV A velocity: 65.30 cm/s MV E/A ratio:  0.01 Jenkins Rouge MD Electronically signed by Jenkins Rouge MD Signature Date/Time: 09/13/2022/9:48:45 AM    Final    US Abdomen Limited RUQ (LIVER/GB)  Result Date: 09/13/2022 CLINICAL DATA:  Elevated LFTs EXAM: ULTRASOUND ABDOMEN LIMITED RIGHT UPPER QUADRANT COMPARISON:  CT scan September 13, 2022 FINDINGS: Gallbladder: No gallstones or wall thickening visualized. No sonographic Murphy sign noted by sonographer. Common bile duct: Diameter: 2.1 mm Liver: No mass or  significant abnormality. Portal vein is patent on color Doppler imaging with normal direction of blood flow towards the liver. Other: None. IMPRESSION: 1. No cause for the patient's elevated LFTs identified. Electronically Signed   By: Dorise Bullion III M.D.   On: 09/13/2022 08:49   [place] EKG SITE RITE  Result Date: 09/13/2022 If Site Rite image not attached, placement could not be confirmed due to current cardiac rhythm.  DG Chest Port 1 View  Result Date: 09/13/2022 CLINICAL DATA:  56 year old male intubated. EXAM: PORTABLE CHEST 1 VIEW COMPARISON:  CTA chest 0049 hours today and earlier. FINDINGS: Portable AP semi upright view at 0601 hours. Endotracheal tube position appears stable from the earlier CTA, tip in good position between the clavicles and carina. Enteric tube courses to the abdomen as on that exam. Stable cardiomegaly and mediastinal contours. Patchy bilateral lung opacity not significantly changed from the earlier CTA (please see that report) no pneumothorax or pleural effusion. No acute osseous abnormality identified. IMPRESSION: 1. Stable and satisfactory endotracheal tube. Enteric tube courses to the abdomen. 2. Stable lung volumes and ventilation from the CTA at 0049 hours today. Electronically Signed   By: Genevie Ann M.D.   On: 09/13/2022 06:25   DG Abdomen 1 View  Result Date: 09/13/2022 CLINICAL DATA:  56 year old male with enteric tube placement. EXAM: ABDOMEN - 1 VIEW COMPARISON:  CT Abdomen and Pelvis 0050 hours  today. FINDINGS: Portable AP supine view at 0558 hours. Enteric tube placed into the stomach. Tip is at the distal stomach and side hole is at the level of the body similar to the earlier CT Abdomen and Pelvis. Negative visible bowel gas. Patchy lung base opacity greater on the right. IMPRESSION: Stable enteric tube placement into the stomach from the CT Abdomen and Pelvis earlier today. Electronically Signed   By: Genevie Ann M.D.   On: 09/13/2022 06:23   CT Angio Chest PE W  and/or Wo Contrast  Result Date: 09/13/2022 CLINICAL DATA:  56 year old male with acute nonlocalized abdominal pain and positive D-dimer; PE suspected EXAM: CT ANGIOGRAPHY CHEST CT ABDOMEN AND PELVIS WITH CONTRAST TECHNIQUE: Multidetector CT imaging of the chest was performed using the standard protocol during bolus administration of intravenous contrast. Multiplanar CT image reconstructions and MIPs were obtained to evaluate the vascular anatomy. Multidetector CT imaging of the abdomen and pelvis was performed using the standard protocol during bolus administration of intravenous contrast. RADIATION DOSE REDUCTION: This exam was performed according to the departmental dose-optimization program which includes automated exposure control, adjustment of the mA and/or kV according to patient size and/or use of iterative reconstruction technique. CONTRAST:  170mL OMNIPAQUE IOHEXOL 350 MG/ML SOLN COMPARISON:  Radiographs earlier today FINDINGS: CTA CHEST FINDINGS Cardiovascular: Satisfactory opacification of the pulmonary arteries to the segmental level. No evidence of pulmonary embolism. Cardiomegaly. No pericardial effusion. Mediastinum/Nodes: No enlarged mediastinal, hilar, or axillary lymph nodes. Enlarged heterogenous right thyroid lobe. Endotracheal tube in the intrathoracic trachea. Enteric tube tip in the stomach. No mediastinal or hilar adenopathy. Prominent left axillary nodes measuring up to 1.1 cm in short axis are favored reactive. Lungs/Pleura: Scan during expiration. Respiratory motion obscures fine detail. Diffuse bronchial wall thickening and scattered mucous plugging. Mild interstitial thickening. Infiltrates in the right-greater-than-left lower lobes. Musculoskeletal: Bilateral gynecomastia.  No acute osseous findings. Review of the MIP images confirms the above findings. CT ABDOMEN and PELVIS FINDINGS Hepatobiliary: No focal liver abnormality is seen. No gallstones, gallbladder wall thickening, or  biliary dilatation. Pancreas: Unremarkable. No pancreatic ductal dilatation or surrounding inflammatory changes. Spleen: Normal in size without focal abnormality. Adrenals/Urinary Tract: Adrenal glands are unremarkable. Kidneys are normal, without renal calculi, focal lesion, or hydronephrosis. Foley catheter in the bladder. Stomach/Bowel: Stomach is within normal limits. No evidence of bowel wall thickening, distention, or inflammatory changes. Vascular/Lymphatic: No significant vascular findings are present. Prominent bilateral inguinal lymph nodes are presumed reactive. Reproductive: Unremarkable. Other: No free intraperitoneal fluid or air. Musculoskeletal: No acute or significant osseous findings. Review of the MIP images confirms the above findings. IMPRESSION: 1. Negative for acute pulmonary embolism. 2. Bronchial wall thickening and mucous plugging. Mild interstitial thickening. More confluence consolidation in the right-greater-than-left lower lobes. Findings may be due to edema, infection, or a combination. 3. Cardiomegaly. 4. No acute findings in the abdomen or pelvis. Electronically Signed   By: Placido Sou M.D.   On: 09/13/2022 01:14   CT ABDOMEN PELVIS W CONTRAST  Result Date: 09/13/2022 CLINICAL DATA:  56 year old male with acute nonlocalized abdominal pain and positive D-dimer; PE suspected EXAM: CT ANGIOGRAPHY CHEST CT ABDOMEN AND PELVIS WITH CONTRAST TECHNIQUE: Multidetector CT imaging of the chest was performed using the standard protocol during bolus administration of intravenous contrast. Multiplanar CT image reconstructions and MIPs were obtained to evaluate the vascular anatomy. Multidetector CT imaging of the abdomen and pelvis was performed using the standard protocol during bolus administration of intravenous contrast. RADIATION DOSE REDUCTION: This  exam was performed according to the departmental dose-optimization program which includes automated exposure control, adjustment of the  mA and/or kV according to patient size and/or use of iterative reconstruction technique. CONTRAST:  168mL OMNIPAQUE IOHEXOL 350 MG/ML SOLN COMPARISON:  Radiographs earlier today FINDINGS: CTA CHEST FINDINGS Cardiovascular: Satisfactory opacification of the pulmonary arteries to the segmental level. No evidence of pulmonary embolism. Cardiomegaly. No pericardial effusion. Mediastinum/Nodes: No enlarged mediastinal, hilar, or axillary lymph nodes. Enlarged heterogenous right thyroid lobe. Endotracheal tube in the intrathoracic trachea. Enteric tube tip in the stomach. No mediastinal or hilar adenopathy. Prominent left axillary nodes measuring up to 1.1 cm in short axis are favored reactive. Lungs/Pleura: Scan during expiration. Respiratory motion obscures fine detail. Diffuse bronchial wall thickening and scattered mucous plugging. Mild interstitial thickening. Infiltrates in the right-greater-than-left lower lobes. Musculoskeletal: Bilateral gynecomastia.  No acute osseous findings. Review of the MIP images confirms the above findings. CT ABDOMEN and PELVIS FINDINGS Hepatobiliary: No focal liver abnormality is seen. No gallstones, gallbladder wall thickening, or biliary dilatation. Pancreas: Unremarkable. No pancreatic ductal dilatation or surrounding inflammatory changes. Spleen: Normal in size without focal abnormality. Adrenals/Urinary Tract: Adrenal glands are unremarkable. Kidneys are normal, without renal calculi, focal lesion, or hydronephrosis. Foley catheter in the bladder. Stomach/Bowel: Stomach is within normal limits. No evidence of bowel wall thickening, distention, or inflammatory changes. Vascular/Lymphatic: No significant vascular findings are present. Prominent bilateral inguinal lymph nodes are presumed reactive. Reproductive: Unremarkable. Other: No free intraperitoneal fluid or air. Musculoskeletal: No acute or significant osseous findings. Review of the MIP images confirms the above findings.  IMPRESSION: 1. Negative for acute pulmonary embolism. 2. Bronchial wall thickening and mucous plugging. Mild interstitial thickening. More confluence consolidation in the right-greater-than-left lower lobes. Findings may be due to edema, infection, or a combination. 3. Cardiomegaly. 4. No acute findings in the abdomen or pelvis. Electronically Signed   By: Placido Sou M.D.   On: 09/13/2022 01:14   DG Chest Portable 1 View  Result Date: 09/12/2022 CLINICAL DATA:  ET tube placement EXAM: PORTABLE CHEST 1 VIEW COMPARISON:  09/12/2022 FINDINGS: Endotracheal tube is 5 cm above the carina. Cardiomegaly with vascular congestion and possible mild interstitial edema. Findings similar to prior study. No effusions or acute bony abnormality. IMPRESSION: Endotracheal tube 5 cm above the carina. Cardiomegaly, mild CHF. Electronically Signed   By: Rolm Baptise M.D.   On: 09/12/2022 23:12   DG Chest Port 1 View  Result Date: 09/12/2022 CLINICAL DATA:  Dyspnea, COPD EXAM: PORTABLE CHEST 1 VIEW COMPARISON:  03/31/2021 FINDINGS: The lungs are symmetrically expanded. Moderate cardiomegaly is stable. There is moderate perihilar interstitial pulmonary infiltrate most in keeping with mild to moderate cardiogenic failure. No pneumothorax or pleural effusion. No acute bone abnormality. IMPRESSION: Mild to moderate cardiogenic failure.  Stable cardiomegaly. Electronically Signed   By: Fidela Salisbury M.D.   On: 09/12/2022 19:25    Microbiology: Recent Results (from the past 240 hour(s))  Resp Panel by RT-PCR (Flu A&B, Covid) Anterior Nasal Swab     Status: None   Collection Time: 09/12/22  7:10 PM   Specimen: Anterior Nasal Swab  Result Value Ref Range Status   SARS Coronavirus 2 by RT PCR NEGATIVE NEGATIVE Final    Comment: (NOTE) SARS-CoV-2 target nucleic acids are NOT DETECTED.  The SARS-CoV-2 RNA is generally detectable in upper respiratory specimens during the acute phase of infection. The lowest concentration  of SARS-CoV-2 viral copies this assay can detect is 138 copies/mL. A negative result does  not preclude SARS-Cov-2 infection and should not be used as the sole basis for treatment or other patient management decisions. A negative result may occur with  improper specimen collection/handling, submission of specimen other than nasopharyngeal swab, presence of viral mutation(s) within the areas targeted by this assay, and inadequate number of viral copies(<138 copies/mL). A negative result must be combined with clinical observations, patient history, and epidemiological information. The expected result is Negative.  Fact Sheet for Patients:  EntrepreneurPulse.com.au  Fact Sheet for Healthcare Providers:  IncredibleEmployment.be  This test is no t yet approved or cleared by the [place] FDA and  has been authorized for detection and/or diagnosis of SARS-CoV-2 by FDA under an Emergency Use Authorization (EUA). This EUA will remain  in effect (meaning this test can be used) for the duration of the COVID-19 declaration under Section 564(b)(1) of the Act, 21 U.S.C.section 360bbb-3(b)(1), unless the authorization is terminated  or revoked sooner.       Influenza A by PCR NEGATIVE NEGATIVE Final   Influenza B by PCR NEGATIVE NEGATIVE Final    Comment: (NOTE) The Xpert Xpress SARS-CoV-2/FLU/RSV plus assay is intended as an aid in the diagnosis of influenza from Nasopharyngeal swab specimens and should not be used as a sole basis for treatment. Nasal washings and aspirates are unacceptable for Xpert Xpress SARS-CoV-2/FLU/RSV testing.  Fact Sheet for Patients: EntrepreneurPulse.com.au  Fact Sheet for Healthcare Providers: IncredibleEmployment.be  This test is not yet approved or cleared by the [place] FDA and has been authorized for detection and/or diagnosis of SARS-CoV-2 by FDA under an Emergency Use  Authorization (EUA). This EUA will remain in effect (meaning this test can be used) for the duration of the COVID-19 declaration under Section 564(b)(1) of the Act, 21 U.S.C. section 360bbb-3(b)(1), unless the authorization is terminated or revoked.  Performed at [facility], [place] [address] [place], [place] [address]   Respiratory (~20 pathogens) panel by PCR     Status: None   Collection Time: 09/12/22  7:10 PM   Specimen: Nasopharyngeal Swab; Respiratory  Result Value Ref Range Status   Adenovirus NOT DETECTED NOT DETECTED Final   Coronavirus 229E NOT DETECTED NOT DETECTED Final    Comment: (NOTE) The Coronavirus on the Respiratory Panel, DOES NOT test for the novel  Coronavirus (2019 nCoV)    Coronavirus HKU1 NOT DETECTED NOT DETECTED Final   Coronavirus NL63 NOT DETECTED NOT DETECTED Final   Coronavirus OC43 NOT DETECTED NOT DETECTED Final   Metapneumovirus NOT DETECTED NOT DETECTED Final   Rhinovirus / Enterovirus NOT DETECTED NOT DETECTED Final   Influenza A NOT DETECTED NOT DETECTED Final   Influenza B NOT DETECTED NOT DETECTED Final   Parainfluenza Virus 1 NOT DETECTED NOT DETECTED Final   Parainfluenza Virus 2 NOT DETECTED NOT DETECTED Final   Parainfluenza Virus 3 NOT DETECTED NOT DETECTED Final   Parainfluenza Virus 4 NOT DETECTED NOT DETECTED Final   Respiratory Syncytial Virus NOT DETECTED NOT DETECTED Final   Bordetella pertussis NOT DETECTED NOT DETECTED Final   Bordetella Parapertussis NOT DETECTED NOT DETECTED Final   Chlamydophila pneumoniae NOT DETECTED NOT DETECTED Final   Mycoplasma pneumoniae NOT DETECTED NOT DETECTED Final    Comment: Performed at [facility], [place]. [address] [place], [place] [address]  Culture, blood (Routine X 2) w Reflex to ID Panel     Status: None (Preliminary result)   Collection Time: 09/13/22  1:50 AM   Specimen: BLOOD  Result Value Ref  Range Status   Specimen Description BLOOD RIGHT ANTECUBITAL  Final   Special  Requests   Final    BOTTLES DRAWN AEROBIC AND ANAEROBIC Blood Culture adequate volume   Culture   Final    NO GROWTH 4 DAYS Performed at [facility], [address], [place], [place] [address]    Report Status PENDING  Incomplete  Culture, blood (Routine X 2) w Reflex to ID Panel     Status: None (Preliminary result)   Collection Time: 09/13/22  1:50 AM   Specimen: BLOOD  Result Value Ref Range Status   Specimen Description BLOOD RIGHT ANTECUBITAL  Final   Special Requests   Final    BOTTLES DRAWN AEROBIC AND ANAEROBIC Blood Culture adequate volume   Culture   Final    NO GROWTH 4 DAYS Performed at [facility], [place] [address] [place], [place] [address]    Report Status PENDING  Incomplete  MRSA Next Gen by PCR, Nasal     Status: None   Collection Time: 09/13/22  2:00 AM  Result Value Ref Range Status   MRSA by PCR Next Gen NOT DETECTED NOT DETECTED Final    Comment: (NOTE) The GeneXpert MRSA Assay (FDA approved for NASAL specimens only), is one component of a comprehensive MRSA colonization surveillance program. It is not intended to diagnose MRSA infection nor to guide or monitor treatment for MRSA infections. Test performance is not FDA approved in patients less than 49 years old. Performed at [facility], [place] [address] [place], [place] [address]   Urine Culture     Status: None   Collection Time: 09/13/22  2:56 AM   Specimen: Urine, Clean Catch  Result Value Ref Range Status   Specimen Description URINE, CLEAN CATCH  Final   Special Requests NONE  Final   Culture   Final    NO GROWTH Performed at [facility], [place] [address], [place], [place] [address]    Report Status 09/14/2022 FINAL  Final     Labs: Basic Metabolic Panel: Recent Labs  Lab 09/12/22 2221 09/12/22 2347 09/13/22 0131 09/13/22 0416 09/14/22 0405 09/16/22 0055 09/17/22 0050  NA 141   < > 140 139 142 143 142  K 5.3*   < > 4.2 3.6 4.8 4.1 4.2  CL 95*  --  98  --   95* 89* 87*  CO2 36*  --  30  --  37* >45* >45*  GLUCOSE 132*  --  144*  --  138* 120* 117*  BUN 24*  --  26*  --  19 21* 19  CREATININE 1.47*  --  1.37*  --  1.14 1.24 1.30*  CALCIUM 8.7*  --  8.4*  --  8.5* 8.3* 8.8*  MG  --   --  2.3  --   --   --   --   PHOS  --   --  4.4  --   --   --   --    < > = values in this interval not displayed.   Liver Function Tests: Recent Labs  Lab 09/12/22 2009 09/13/22 0131  AST 65* 53*  ALT 51* 47*  ALKPHOS 152* 123  BILITOT 1.4* 0.7  PROT 7.4 6.8  ALBUMIN 2.8* 2.6*   Recent Labs  Lab 09/12/22 2009  LIPASE 19   Recent Labs  Lab 09/13/22 0131  AMMONIA 44*   CBC: Recent Labs  Lab 09/12/22 1933  09/12/22 2049 09/12/22 2347 09/13/22 0131 09/13/22 0416 09/14/22 0405 09/16/22 0055  WBC 11.6*  --   --  8.8  --  9.0 7.5  NEUTROABS 7.7  --   --   --   --   --   --   HGB 13.4   < > 14.6 12.1* 12.9* 12.0* 12.3*  HCT 44.7   < > 43.0 39.5 38.0* 39.3 42.6  MCV 79.7*  --   --  77.8*  --  78.6* 80.8  PLT 251  --   --  209  --  197 195   < > = values in this interval not displayed.   Cardiac Enzymes: No results for input(s): "CKTOTAL", "CKMB", "CKMBINDEX", "TROPONINI" in the last 168 hours. BNP: BNP (last 3 results) Recent Labs    09/12/22 1933  BNP 427.0*    ProBNP (last 3 results) No results for input(s): "PROBNP" in the last 8760 hours.  CBG: Recent Labs  Lab 09/16/22 0613 09/16/22 1121 09/16/22 1657 09/16/22 2111 09/17/22 0611  GLUCAP 120* 117* 146* 142* 131*       Signed:  Domenic Polite MD.  [facility] 09/17/2022, 10:24 AM

## 2022-09-17 NOTE — Progress Notes (Signed)
Heart Failure Nurse Navigator Progress Note  PCP: [name], MD PCP-Cardiologist: [place] Admission Diagnosis: None Admitted from: Home via [facility]  Presentation:   Francisco Capuchin presented with COPD, shortness of breath, abdomen and chest pain,ran out of his home oxygen a couple of weeks ago. O2 saturations on EMS arrival 80% placed on 6 L [place]. (Patient currently lives in a [place]) BP 145/81, HR 88, bilateral lower extremity edema present, BNP 427, BMI 51.60%. Solu-medrol and neb treatments given, patient failed BiPAP and was intubated and ventilated due to unresponsiveness and hypercapnia. Admitted to ICU.   Patient was educated on the sign and symptoms of heart failure, daily weights, when to call his doctor or go to the ER. Diet/ fluid restrictions, patient stated he eats a lot of fast food including [employer] and drinks Pepsi. Extensive diet education was done and patient interacted well and asked questions, spoke about attending all medical appointments, patient stated he usually takes a Lyft for his appointments, patient verbalized his understanding of education, a hospital follow up for HF TOC was scheduled for 09/28/2022 @ 11 am.     ECHO/ LVEF: 60-65% G2DD HFpEF  Clinical Course:  Past Medical History:  Diagnosis Date   Acute diastolic HF (heart failure) ([place]) 02/08/2019   Acute respiratory failure with hypoxia ([place]) 02/08/2019   CHF (congestive heart failure) ([facility])    COPD (chronic obstructive pulmonary disease) ([place])    HTN (hypertension)    Morbid obesity ([place])      Social History   Socioeconomic History   Marital status: Single    Spouse name: Not on file   Number of children: Not on file   Years of education: Not on file   Highest education level: Not on file  Occupational History   Not on file  Tobacco Use   Smoking status: Never   Smokeless tobacco: Never  Vaping Use   Vaping Use: Never used  Substance and Sexual Activity   Alcohol use: Never   Drug use:  Never   Sexual activity: Not on file  Other Topics Concern   Not on file  Social History Narrative   Lives with cousin for now.  Works at [employer].     Social Determinants of Health   Financial Resource Strain: Low Risk  (09/17/2022)   Overall Financial Resource Strain (CARDIA)    Difficulty of Paying Living Expenses: Not very hard  Food Insecurity: No Food Insecurity (09/13/2022)   Hunger Vital Sign    Worried About Running Out of Food in the Last Year: Never true    Ran Out of Food in the Last Year: Never true  Transportation Needs: No Transportation Needs (09/13/2022)   PRAPARE - Hydrologist (Medical): No    Lack of Transportation (Non-Medical): No  Physical Activity: Not on file  Stress: Not on file  Social Connections: Not on file   Education Assessment and Provision:  Detailed education and instructions provided on heart failure disease management including the following:  Signs and symptoms of Heart Failure When to call the physician Importance of daily weights Low sodium diet Fluid restriction Medication management Anticipated future follow-up appointments  Patient education given on each of the above topics.  Patient acknowledges understanding via teach back method and acceptance of all instructions.  Education Materials:  "Living Better With Heart Failure" Booklet, HF zone tool, & Daily Weight Tracker Tool.  Patient has scale at home: yes Patient has pill box at home: Yes  High Risk Criteria for Readmission and/or Poor Patient Outcomes: Heart failure hospital admissions (last 6 months):  0 No Show rate: 27% Difficult social situation: Lives in a Boarding house Demonstrates medication adherence: yes Primary Language: English Literacy level: Reading, writing, and comprehension  Barriers of Care:   Diet/ fluid restrictions ( Fast food, [employer], and Pepsi)  Daily weights Compliance with medical management ( O2) Patient states he uses a  Lyft for all his appointments)  Considerations/Referrals:   Referral made to Heart Failure Pharmacist Stewardship: Yes Referral made to Heart Failure CSW/NCM TOC: No Referral made to Heart & Vascular TOC clinic: Yes, 09/28/2022 @ 11 am  Items for Follow-up on DC/TOC: Diet/ fluid restrictions ( eats fast food, [employer], and Pepsi) Daily weights Compliance with medical management ( O2) ? CSW, uses Lyft for transportation, lives in [place] house.   Rhae Hammock, BSN, Scientist, clinical (histocompatibility and immunogenetics) Only

## 2022-09-17 NOTE — Progress Notes (Signed)
   09/17/22 0900  Mobility  Activity Ambulated independently in hallway  Level of Assistance Independent  Assistive Device None  Distance Ambulated (ft) 230 ft  Activity Response Tolerated well  $Mobility charge 1 Mobility   Mobility Specialist Progress Note  Received pt in EOB having no complaints and agreeable to mobility. Pt was asymptomatic throughout ambulation and returned to room w/o fault. Left in EOB w/ call bell in reach and all needs met.   Lucious Groves Mobility Specialist

## 2022-09-17 NOTE — TOC Transition Note (Addendum)
Transition of Care [facility]) - CM/SW Discharge Note   Patient Details  Name: Edwin Mcclain MRN: [id number] Date of Birth: 08/08/1966  [facility] CM/SW Contact:  Zenon Mayo, RN Phone Number: 09/17/2022, 9:57 AM   Clinical Narrative:    Patient is from home a boarding house, he has a cpap machine at home, he also has oxygen at home, will need a oxygen tank to go home with.  He states that his friend will bring him some clothes to go home with.  NCM notified Jodell Cipro with [employer] he will need a tank to go home with.  NCM gave him some food sources and housing resources on 9/27.  He has transportation home.  He is upset because he states the hospital lost his keys when he came in, he was transferred to this unit from [facility] ICU,  and he can not find them. NCM contacted ED NCM  to see if they could find them.  The ED NCM  were looking yesterday and did not find them. ED NCM said she will call this NCM back after she checks with security. Also he states that his portable oxygen is not working right at home, but he has a Conservation officer, nature that makes the oxygen.  NCM notified Jodell Cipro with [employer] she states she will put in a service ticket for a rep to go by and check his portable oxygen.     Final next level of care: Home/Self Care Barriers to Discharge: No Barriers Identified   Patient Goals and CMS Choice Patient states their goals for this hospitalization and ongoing recovery are:: return home   Choice offered to / list presented to : NA  Discharge Placement                       Discharge Plan and Services                  DME Agency: NA       HH Arranged: NA          Social Determinants of Health (SDOH) Interventions     Readmission Risk Interventions     No data to display

## 2022-09-17 NOTE — Progress Notes (Signed)
Explained discharge instructions to patient. Reviewed followed up appointments and next medication administration times. He verbalized having an understanding. Patient has his personal pillow, luggage bag, cell phone, charger, and his wallet without any money in his possession. According to patient he is missing his keys and pants. I searched security lost and found security bin and the last unit he was on [facility]), his belongings were not in either places. Was explained to patient. He was given patient experience number by the unit secretary. Oxygen tank is in his possession. Stopping by [facility] pharmacy to pick up his discharge medication. No further needs expressed.

## 2022-09-18 LAB — CULTURE, BLOOD (ROUTINE X 2)
Culture: NO GROWTH
Culture: NO GROWTH
Special Requests: ADEQUATE
Special Requests: ADEQUATE

## 2022-09-21 ENCOUNTER — Telehealth: Payer: Self-pay

## 2022-09-21 ENCOUNTER — Other Ambulatory Visit: Payer: Self-pay

## 2022-09-21 ENCOUNTER — Other Ambulatory Visit: Payer: Self-pay | Admitting: Family Medicine

## 2022-09-21 ENCOUNTER — Other Ambulatory Visit (HOSPITAL_COMMUNITY): Payer: Self-pay

## 2022-09-21 DIAGNOSIS — I10 Essential (primary) hypertension: Secondary | ICD-10-CM

## 2022-09-21 NOTE — Telephone Encounter (Signed)
Transition Care Management Follow-up Telephone Call Date of discharge and from where: 09/17/2022, [facility] How have you been since you were released from the hospital? He stated he is doing okay.  Any questions or concerns? Yes- he doesn't think he has all of his medications and will need to check what he has with the [facility]. He also said he is not able to afford them until he receives his check, so he did not pick up the new medications at the [place].  He stated that he likes [facility] and I explained to him that he can have his medications transferred there and they will see if he is eligible for any patient assistance and will also allow him to charge the cost of most medications. They can also have them mailed to him if needed.  I provided him with the phone number for the [facility] and he said he would call. I reminded him of the importance of obtaining his medications. He also needs a glucometer and a scale.  Items Reviewed: Did the pt receive and understand the discharge instructions provided?  He said he needs to look at them again.  Medications obtained and verified?  He does not have all of his medications- see note above.  Other? No  Any new allergies since your discharge? No  Dietary orders reviewed? Yes Do you have support at home?  He said yes and no.  He is currently living in a boarding [place] and Equipment/Supplies: Were home health services ordered? no If so, what is the name of the agency? N/a  Has the agency set up a time to come to the patient's home? not applicable Were any new equipment or medical supplies ordered?  No What is the name of the medical supply agency? N/a Were you able to get the supplies/equipment? not applicable Do you have any questions related to the use of the equipment or supplies? No  Functional Questionnaire: (I = Independent and D = Dependent) ADLs: independent. Has home O2 that he uses @ 3L  continuously.  He also has a CPAP machine.   Follow up appointments reviewed:  PCP Hospital f/u appt confirmed? Yes  Scheduled to see Dr Redmond Pulling- 09/30/2022.   [facility] f/u appt confirmed? Yes  Scheduled to see cardiology- 09/28/2022.  Are transportation arrangements needed? Yes - he stated that he usually needs to take a Lyft.  If their condition worsens, is the pt aware to call PCP or go to the Emergency Dept.? Yes Was the patient provided with contact information for the PCP's office or ED? Yes Was to pt encouraged to call back with questions or concerns? Yes

## 2022-09-22 ENCOUNTER — Other Ambulatory Visit (HOSPITAL_COMMUNITY): Payer: Self-pay

## 2022-09-22 MED ORDER — LOSARTAN POTASSIUM 25 MG PO TABS
25.0000 mg | ORAL_TABLET | Freq: Every day | ORAL | 0 refills | Status: DC
  Filled 2022-09-22: qty 60, 60d supply, fill #0

## 2022-09-22 NOTE — Telephone Encounter (Signed)
Requested Prescriptions  Pending Prescriptions Disp Refills  . losartan (COZAAR) 25 MG tablet 60 tablet 0    Sig: TAKE 1 TABLET(25 MG) BY MOUTH DAILY     Cardiovascular:  Angiotensin Receptor Blockers Failed - 09/21/2022 11:16 AM      Failed - Cr in normal range and within 180 days    Creatinine, Ser  Date Value Ref Range Status  09/17/2022 1.30 (H) 0.61 - 1.24 mg/dL Final         Passed - K in normal range and within 180 days    Potassium  Date Value Ref Range Status  09/17/2022 4.2 3.5 - 5.1 mmol/L Final         Passed - Patient is not pregnant      Passed - Last BP in normal range    BP Readings from Last 1 Encounters:  09/17/22 129/72         Passed - Valid encounter within last 6 months    Recent Outpatient Visits          4 months ago Type 2 diabetes mellitus with hyperglycemia, without long-term current use of insulin ([place])   Primary Care at [facility], MD   5 months ago Type 2 diabetes mellitus with complication, without long-term current use of insulin [facility])   [place], Annie Main L, RPH-CPP   8 months ago Type 2 diabetes mellitus with complication, without long-term current use of insulin [facility])   Primary Care at [facility], Clyde Canterbury, MD   10 months ago Type 2 diabetes mellitus with complication, without long-term current use of insulin [facility])   Primary Care at [facility], Clyde Canterbury, MD   1 year ago Type 2 diabetes mellitus with complication, without long-term current use of insulin [facility])   Primary Care at [facility], Bayard Beaver, MD      Future Appointments            In 1 week [name], MD Primary Care at [facility]

## 2022-09-28 ENCOUNTER — Telehealth: Payer: Self-pay | Admitting: *Deleted

## 2022-09-28 ENCOUNTER — Telehealth (HOSPITAL_COMMUNITY): Payer: Self-pay

## 2022-09-28 ENCOUNTER — Encounter (HOSPITAL_COMMUNITY): Payer: Self-pay

## 2022-09-28 ENCOUNTER — Other Ambulatory Visit (HOSPITAL_COMMUNITY): Payer: Self-pay

## 2022-09-28 ENCOUNTER — Ambulatory Visit (HOSPITAL_COMMUNITY)
Admit: 2022-09-28 | Discharge: 2022-09-28 | Disposition: A | Discharge status: Home / Self Care | Payer: Self-pay | Attending: Cardiology | Admitting: Cardiology

## 2022-09-28 VITALS — BP 120/80 | HR 70 | Wt 378.0 lb

## 2022-09-28 DIAGNOSIS — Z7984 Long term (current) use of oral hypoglycemic drugs: Secondary | ICD-10-CM | POA: Insufficient documentation

## 2022-09-28 DIAGNOSIS — Z6841 Body Mass Index (BMI) 40.0 and over, adult: Secondary | ICD-10-CM | POA: Insufficient documentation

## 2022-09-28 DIAGNOSIS — J9601 Acute respiratory failure with hypoxia: Secondary | ICD-10-CM | POA: Insufficient documentation

## 2022-09-28 DIAGNOSIS — I110 Hypertensive heart disease with heart failure: Secondary | ICD-10-CM | POA: Insufficient documentation

## 2022-09-28 DIAGNOSIS — E785 Hyperlipidemia, unspecified: Secondary | ICD-10-CM | POA: Insufficient documentation

## 2022-09-28 DIAGNOSIS — E119 Type 2 diabetes mellitus without complications: Secondary | ICD-10-CM | POA: Insufficient documentation

## 2022-09-28 DIAGNOSIS — J9811 Atelectasis: Secondary | ICD-10-CM | POA: Insufficient documentation

## 2022-09-28 DIAGNOSIS — I5032 Chronic diastolic (congestive) heart failure: Secondary | ICD-10-CM | POA: Insufficient documentation

## 2022-09-28 DIAGNOSIS — G4733 Obstructive sleep apnea (adult) (pediatric): Secondary | ICD-10-CM | POA: Insufficient documentation

## 2022-09-28 DIAGNOSIS — Z79899 Other long term (current) drug therapy: Secondary | ICD-10-CM | POA: Insufficient documentation

## 2022-09-28 LAB — BASIC METABOLIC PANEL
Anion gap: 6 (ref 5–15)
BUN: 7 mg/dL (ref 6–20)
CO2: 34 mmol/L — ABNORMAL HIGH (ref 22–32)
Calcium: 9.1 mg/dL (ref 8.9–10.3)
Chloride: 103 mmol/L (ref 98–111)
Creatinine, Ser: 0.91 mg/dL (ref 0.61–1.24)
GFR, Estimated: >60 mL/min (ref >60)
Glucose, Bld: 176 mg/dL — ABNORMAL HIGH (ref 70–99)
Potassium: 4.7 mmol/L (ref 3.5–5.1)
Sodium: 143 mmol/L (ref 135–145)

## 2022-09-28 MED ORDER — ENTRESTO 24-26 MG PO TABS
1.0000 | ORAL_TABLET | Freq: Two times a day (BID) | ORAL | 6 refills | Status: DC
  Filled 2022-09-28: qty 60, 30d supply, fill #0

## 2022-09-28 NOTE — Progress Notes (Signed)
HEART & VASCULAR TRANSITION OF CARE CONSULT NOTE     Referring Physician: Dr. Jomarie Longs  Primary Care: Georganna Skeans, MD Primary Cardiologist: Rollene Rotunda, MD   HPI: Referred to clinic by Dr. Jomarie Longs for heart failure consultation.   56 y/o AAM w/ chronic diastolic heart failure, HTN, OSA intolerant of CPAP, obesity, HTN and HLD, recently presented to [facility] 9/23 w/ AMS and severe respiratory distress and found to be in acute hypoxic/hypercarbic respiratory failure. Failred BiPAP in the ED and required intubation and admitted to ICU. CXR showed pulmonary vascular congestion. CT of chest negative for PE and showed bibascilar atelectasis. He was diuresed w/ IV Lasix and treated w/ bronchodilators and systemic steroids and sucessfully extubated. 2D echo showed preserved LVEF 60-65%, GIIDD, RV normal. No RVSP estimation. Volume status improved w/ diuresis. He diuresed 9.3L and transitioned to PO torsemide. Placed on losartan, Farxiga, and spironolactone. TOC consulted for assistance w/ home BiPAP. Referred to [facility] clinic. D/c wt 369 lb.   He presents today for post hospital f/u. Reports feeling better. Wt up 7 lb at 378 lb. BP 120/80. No scale at home. Able to do his ADLs w/o significant dyspnea but not very active at baseline. Reports full compliance w/ meds and BiPAP. Reports prior h/o binge eating but has been working to cut portion sizes since discharge. Has been trying to walk more.     Cardiac Testing   2D Echo 08/2022 Left ventricular ejection fraction, by estimation, is 60 to 65%. The left ventricle has normal function. The left ventricle has no regional wall motion abnormalities. There is mild left ventricular hypertrophy. Left ventricular diastolic parameters are consistent with Grade II diastolic dysfunction (pseudonormalization). 1. 2. Right ventricular systolic function is normal. The right ventricular size is normal. The mitral valve is normal in structure. No evidence of mitral  valve regurgitation. No evidence of mitral stenosis. 3. The aortic valve is tricuspid. Aortic valve regurgitation is not visualized. No aortic stenosis is present. 4. The inferior vena cava is normal in size with greater than 50% respiratory variability, suggesting right atrial pressure of 3 mmHg.   Review of Systems: [y] = yes, [ ]  = no   General: Weight gain [Y ]; Weight loss [ ] ; Anorexia [ ] ; Fatigue [ ] ; Fever [ ] ; Chills [ ] ; Weakness [ ]   Cardiac: Chest pain/pressure [ ] ; Resting SOB [ ] ; Exertional SOB [ ] ; Orthopnea [ ] ; Pedal Edema [ ] ; Palpitations [ ] ; Syncope [ ] ; Presyncope [ ] ; Paroxysmal nocturnal dyspnea[ ]   Pulmonary: Cough [ ] ; Wheezing[ ] ; Hemoptysis[ ] ; Sputum [ ] ; Snoring [ ]   GI: Vomiting[ ] ; Dysphagia[ ] ; Melena[ ] ; Hematochezia [ ] ; Heartburn[ ] ; Abdominal pain [ ] ; Constipation [ ] ; Diarrhea [ ] ; BRBPR [ ]   GU: Hematuria[ ] ; Dysuria [ ] ; Nocturia[ ]   Vascular: Pain in legs with walking [ ] ; Pain in feet with lying flat [ ] ; Non-healing sores [ ] ; Stroke [ ] ; TIA [ ] ; Slurred speech [ ] ;  Neuro: Headaches[ ] ; Vertigo[ ] ; Seizures[ ] ; Paresthesias[ ] ;Blurred vision [ ] ; Diplopia [ ] ; Vision changes [ ]   Ortho/Skin: Arthritis [ ] ; Joint pain [ ] ; Muscle pain [ ] ; Joint swelling [ ] ; Back Pain [ ] ; Rash [ ]   Psych: Depression[ ] ; Anxiety[ ]   Heme: Bleeding problems [ ] ; Clotting disorders [ ] ; Anemia [ ]   Endocrine: Diabetes [ Y]; Thyroid dysfunction[ ]    Past Medical History:  Diagnosis Date   Acute diastolic HF (  heart failure) ([place]) 02/08/2019   Acute respiratory failure with hypoxia ([place]) 02/08/2019   CHF (congestive heart failure) (HCC)    COPD (chronic obstructive pulmonary disease) (HCC)    HTN (hypertension)    Morbid obesity (HCC)     Current Outpatient Medications  Medication Sig Dispense Refill   Accu-Chek Softclix Lancets lancets Use to check blood sugar once daily. 100 each 2   aspirin 81 MG EC tablet Take 1 tablet (81 mg total) by mouth daily. 30  tablet    atorvastatin (LIPITOR) 10 MG tablet Take 1 tablet (10 mg total) by mouth daily. 90 tablet 1   carvedilol (COREG) 25 MG tablet Take 1 tablet (25 mg total) by mouth 2 (two) times daily with a meal. 180 tablet 1   dapagliflozin propanediol (FARXIGA) 10 MG TABS tablet Take 1 tablet (10 mg total) by mouth daily before breakfast. 30 tablet 2   glucose blood (ACCU-CHEK GUIDE) test strip Use to check blood sugar once daily. 100 each 2   iron polysaccharides (NIFEREX) 150 MG capsule Take 150 mg by mouth daily.     losartan (COZAAR) 25 MG tablet Take 1 tablet (25 mg total) by mouth daily. 60 tablet 0   Melatonin 10 MG TABS Take 1 tablet by mouth daily as needed (sleep).     sitaGLIPtin (JANUVIA) 25 MG tablet Take 1 tablet (25 mg total) by mouth daily. 90 tablet 0   spironolactone (ALDACTONE) 25 MG tablet Take 1 tablet (25 mg total) by mouth daily. 30 tablet 0   torsemide (DEMADEX) 20 MG tablet Take 2 tablets (40 mg total) by mouth daily. 180 tablet 1   vitamin B-12 1000 MCG tablet Take 1 tablet (1,000 mcg total) by mouth daily. 30 tablet 0   No current facility-administered medications for this encounter.    Allergies  Allergen Reactions   Metformin And Related Diarrhea      Social History   Socioeconomic History   Marital status: Single    Spouse name: Not on file   Number of children: Not on file   Years of education: Not on file   Highest education level: Not on file  Occupational History   Not on file  Tobacco Use   Smoking status: Never   Smokeless tobacco: Never  Vaping Use   Vaping Use: Never used  Substance and Sexual Activity   Alcohol use: Never   Drug use: Never   Sexual activity: Not on file  Other Topics Concern   Not on file  Social History Narrative   Lives with cousin for now.  Works at [employer].     Social Determinants of Health   Financial Resource Strain: Low Risk  (09/17/2022)   Overall Financial Resource Strain (CARDIA)    Difficulty of Paying Living  Expenses: Not very hard  Food Insecurity: No Food Insecurity (09/13/2022)   Hunger Vital Sign    Worried About Running Out of Food in the Last Year: Never true    Ran Out of Food in the Last Year: Never true  Transportation Needs: No Transportation Needs (09/13/2022)   PRAPARE - Hydrologist (Medical): No    Lack of Transportation (Non-Medical): No  Physical Activity: Not on file  Stress: Not on file  Social Connections: Not on file  Intimate Partner Violence: Not At Risk (09/13/2022)   Humiliation, Afraid, Rape, and Kick questionnaire    Fear of Current or Ex-Partner: No    Emotionally Abused: No  Physically Abused: No    Sexually Abused: No      Family History  Problem Relation Age of Onset   Cancer Mother        Patient is not sure which type of cancer   Leukemia Father     Vitals:   09/28/22 1040  BP: 120/80  Pulse: 70  SpO2: 93%  Weight: (!) 171.5 kg (378 lb)    PHYSICAL EXAM: General:  disheveled appearing, obese. No respiratory difficulty HEENT: normal Neck: supple. Thick neck, JVD not well visualized. Carotids 2+ bilat; no bruits. No lymphadenopathy or thryomegaly appreciated. Cor: PMI nondisplaced. Regular rate & rhythm. No rubs, gallops or murmurs. Lungs: clear Abdomen: obese, soft, nontender, nondistended. No hepatosplenomegaly. No bruits or masses. Good bowel sounds. Extremities: no cyanosis, clubbing, rash, edema Neuro: alert & oriented x 3, cranial nerves grossly intact. moves all 4 extremities w/o difficulty. Affect pleasant.  ECG: Not performed   ASSESSMENT & PLAN:  1. Chronic Diastolic Heart Failure - Echo 9/23 EF 60-65%, GIIDD, RV normal - recent admit for a/c CHF and acute hypoxic respiratory failure, CXR w/ pulmonary edema. Diuresed 9 L. Wt trending up post discharge. NYHA Class II-early III, confounded by morbid obesity and deconditioning. Exam difficult due to body habitus but suspect fluid overload based on wt  gain. A-P chest diameter not favorable for accurate ReDS assessment   - Stop Losartan. Transition to Entresto 24-26 mg bid - Continue Farxiga 10 mg daily  - Continue Spironolactone 25 mg daily  - Continue Torsemide 40 mg daily  - Continue Coreg 25 mg bid  - Discussed fluid and sodium restriction  - Discussed importance of daily wts. He was gifted home scale    2. OSA/ OHS  - recent admit for hypercarbic respiratory failure due to poor compliance w/ CPAP, leading to mechanical ventilation  - Echo w/ normal RV. RVSP not measured but suspected some degree of PH (likely WHO Groups 2+3)  - now w/ new BiPAP. He reports full compliance and tolerating device well  - stressed importance of strict compliance - recommended wt loss   3. Hypertension  - controlled on current regimen - stop losartan, switch to Entresto - BMP today   4. Obesity  - Body mass index is 52.72 kg/m. - encouraged lifestyle modification for wt loss  - we discussed portion control - he is also trying to walk more  5. Type 2DM  - Hgb A1c 9.8 - On Farxiga and Januvia - managed by PCP    NYHA II-early III GDMT  Diuretic- Torsemide 40 mg daily  BB- Coreg 25 mg bid  Ace/ARB/ARNI Entresto 24-26 mg bid  MRA  Spironolactone 25 mg daily  SGLT2i Farxiga 10 mg daily     Referred to HFSW (PCP, Medications, Transportation, ETOH Abuse, Drug Abuse, Insurance, Museum/gallery curator ): \No Refer to Pharmacy:  No Refer to Home Health: No Refer to Advanced Heart Failure Clinic: No  Refer to General Cardiology: Yes (Dr. Percival Spanish)   Follow up  in [facility] for 1 last visit in 2 weeks to reassess volume status and further titrate meds.

## 2022-09-28 NOTE — Telephone Encounter (Signed)
Pt calling for lab results from today. Results reviewed, message from B. Simmons,PA from [facility]. Pt had called [place] line.  "Labs ok."

## 2022-09-28 NOTE — Telephone Encounter (Signed)
Prior authorization approved from 09/14/22-09/28/23

## 2022-09-28 NOTE — Telephone Encounter (Signed)
Call attempted to confirm [facility] TOC appt today at 11AM. HIPPA appropriate VM left with callback number.   Zacharey Jensen, MSN, RN Heart Failure Nurse Navigator  

## 2022-09-28 NOTE — Telephone Encounter (Signed)
Call placed to patient [phone] to inquire if he needs transportation to his appointment at [facility] on 09/30/2022. Message left with call back requested.

## 2022-09-28 NOTE — Telephone Encounter (Signed)
Heart Failure Patient Advocate Encounter   Received notification from [place] that prior authorization for Delene Loll is required.   PA submitted on CoverMyMeds Key [id number] Status is pending

## 2022-09-28 NOTE — Patient Instructions (Addendum)
Stop Losartan Start Entresto 24-26 mg twice daily - 30 day free coupon provided Return to [facility] clinic in 2 weeks - scheduled.  Will call you if your labs are abnormal today.

## 2022-09-29 ENCOUNTER — Other Ambulatory Visit (HOSPITAL_COMMUNITY): Payer: Self-pay

## 2022-09-29 ENCOUNTER — Telehealth: Payer: Self-pay

## 2022-09-29 NOTE — Telephone Encounter (Signed)
I called patient to remind him of his appointment at [facility] tomorrow and to inquire if he has arranged transportation and he said he doesn't have a ride yet.  I told him I would schedule a cab  ride for him. I confirmed his address for pick up.  I called [employer] and scheduled a ride for patient tomorrow to [place]. Pick up time 1000.  I then emailed voucher to [facility].   I called patient back and informed him of plan for ride and pick up at 1000.  He was very Patent attorney.  I informed him that he needs to be ready because they will not wait for him. I also explained that a ride will be arranged to take him back home when his appointment is done.

## 2022-09-30 ENCOUNTER — Other Ambulatory Visit (HOSPITAL_COMMUNITY): Payer: Self-pay

## 2022-09-30 ENCOUNTER — Ambulatory Visit (INDEPENDENT_AMBULATORY_CARE_PROVIDER_SITE_OTHER): Payer: Self-pay | Admitting: Family Medicine

## 2022-09-30 ENCOUNTER — Encounter: Payer: Self-pay | Admitting: Family Medicine

## 2022-09-30 VITALS — BP 116/78 | HR 67 | Temp 97.7°F | Resp 18 | Wt 379.2 lb

## 2022-09-30 DIAGNOSIS — E118 Type 2 diabetes mellitus with unspecified complications: Secondary | ICD-10-CM

## 2022-09-30 DIAGNOSIS — Z6841 Body Mass Index (BMI) 40.0 and over, adult: Secondary | ICD-10-CM

## 2022-09-30 DIAGNOSIS — Z09 Encounter for follow-up examination after completed treatment for conditions other than malignant neoplasm: Secondary | ICD-10-CM

## 2022-09-30 DIAGNOSIS — E1165 Type 2 diabetes mellitus with hyperglycemia: Secondary | ICD-10-CM

## 2022-09-30 DIAGNOSIS — Z23 Encounter for immunization: Secondary | ICD-10-CM

## 2022-09-30 DIAGNOSIS — I5032 Chronic diastolic (congestive) heart failure: Secondary | ICD-10-CM

## 2022-09-30 DIAGNOSIS — I10 Essential (primary) hypertension: Secondary | ICD-10-CM

## 2022-09-30 LAB — POCT GLYCOSYLATED HEMOGLOBIN (HGB A1C): Hemoglobin A1C: 6.9 % — AB (ref 4.0–5.6)

## 2022-09-30 MED ORDER — SPIRONOLACTONE 25 MG PO TABS
25.0000 mg | ORAL_TABLET | Freq: Every day | ORAL | 1 refills | Status: AC
  Filled 2022-09-30: qty 90, 90d supply, fill #0

## 2022-09-30 MED ORDER — DAPAGLIFLOZIN PROPANEDIOL 10 MG PO TABS
10.0000 mg | ORAL_TABLET | Freq: Every day | ORAL | 5 refills | Status: AC
  Filled 2022-09-30: qty 30, 30d supply, fill #0

## 2022-09-30 MED ORDER — CARVEDILOL 25 MG PO TABS
25.0000 mg | ORAL_TABLET | Freq: Two times a day (BID) | ORAL | 1 refills | Status: AC
  Filled 2022-09-30: qty 180, 90d supply, fill #0

## 2022-09-30 MED ORDER — SITAGLIPTIN PHOSPHATE 25 MG PO TABS
25.0000 mg | ORAL_TABLET | Freq: Every day | ORAL | 1 refills | Status: AC
  Filled 2022-09-30: qty 90, 90d supply, fill #0

## 2022-09-30 MED ORDER — ATORVASTATIN CALCIUM 10 MG PO TABS
10.0000 mg | ORAL_TABLET | Freq: Every day | ORAL | 1 refills | Status: AC
  Filled 2022-09-30: qty 90, 90d supply, fill #0

## 2022-09-30 MED ORDER — ENTRESTO 24-26 MG PO TABS
1.0000 | ORAL_TABLET | Freq: Two times a day (BID) | ORAL | 6 refills | Status: AC
  Filled 2022-09-30: qty 60, 30d supply, fill #0

## 2022-09-30 MED ORDER — TORSEMIDE 20 MG PO TABS
40.0000 mg | ORAL_TABLET | Freq: Every day | ORAL | 1 refills | Status: AC
  Filled 2022-09-30: qty 180, 90d supply, fill #0

## 2022-10-01 LAB — BASIC METABOLIC PANEL
BUN/Creatinine Ratio: 9 (ref 9–20)
BUN: 8 mg/dL (ref 6–24)
CO2: 28 mmol/L (ref 20–29)
Calcium: 9.1 mg/dL (ref 8.7–10.2)
Chloride: 102 mmol/L (ref 96–106)
Creatinine, Ser: 0.92 mg/dL (ref 0.76–1.27)
Glucose: 128 mg/dL — ABNORMAL HIGH (ref 70–99)
Potassium: 4.7 mmol/L (ref 3.5–5.2)
Sodium: 146 mmol/L — ABNORMAL HIGH (ref 134–144)
eGFR: 98 mL/min/{1.73_m2} (ref >59)

## 2022-10-02 ENCOUNTER — Other Ambulatory Visit (HOSPITAL_COMMUNITY): Payer: Self-pay

## 2022-10-05 ENCOUNTER — Encounter: Payer: Self-pay | Admitting: Family Medicine

## 2022-10-05 NOTE — Progress Notes (Signed)
Established Patient Office Visit  Subjective    Patient ID: Tomislav Micale, male    DOB: 1966/12/06  Age: 56 y.o. MRN: [id number]  CC:  Chief Complaint  Patient presents with   Follow-up    HFU    HPI Alvino Lechuga presents for hospital discharge follow up where her was admitted with CHF and acute respiratory failure. Patient reports doing well since discharge and denies acute complaints or concerns.    Outpatient Encounter Medications as of 09/30/2022  Medication Sig   Accu-Chek Softclix Lancets lancets Use to check blood sugar once daily.   aspirin 81 MG EC tablet Take 1 tablet (81 mg total) by mouth daily.   glucose blood (ACCU-CHEK GUIDE) test strip Use to check blood sugar once daily.   iron polysaccharides (NIFEREX) 150 MG capsule Take 150 mg by mouth daily.   Melatonin 10 MG TABS Take 1 tablet by mouth daily as needed (sleep).   vitamin B-12 1000 MCG tablet Take 1 tablet (1,000 mcg total) by mouth daily.   [DISCONTINUED] atorvastatin (LIPITOR) 10 MG tablet Take 1 tablet (10 mg total) by mouth daily.   [DISCONTINUED] carvedilol (COREG) 25 MG tablet Take 1 tablet (25 mg total) by mouth 2 (two) times daily with a meal.   [DISCONTINUED] dapagliflozin propanediol (FARXIGA) 10 MG TABS tablet Take 1 tablet (10 mg total) by mouth daily before breakfast.   [DISCONTINUED] sacubitril-valsartan (ENTRESTO) 24-26 MG Take 1 tablet by mouth 2 (two) times daily.   [DISCONTINUED] sitaGLIPtin (JANUVIA) 25 MG tablet Take 1 tablet (25 mg total) by mouth daily.   [DISCONTINUED] spironolactone (ALDACTONE) 25 MG tablet Take 1 tablet (25 mg total) by mouth daily.   [DISCONTINUED] torsemide (DEMADEX) 20 MG tablet Take 2 tablets (40 mg total) by mouth daily.   atorvastatin (LIPITOR) 10 MG tablet Take 1 tablet (10 mg total) by mouth daily.   carvedilol (COREG) 25 MG tablet Take 1 tablet (25 mg total) by mouth 2 (two) times daily with a meal.   dapagliflozin propanediol (FARXIGA) 10 MG TABS tablet Take 1  tablet (10 mg total) by mouth daily before breakfast.   sacubitril-valsartan (ENTRESTO) 24-26 MG Take 1 tablet by mouth 2 (two) times daily.   sitaGLIPtin (JANUVIA) 25 MG tablet Take 1 tablet (25 mg total) by mouth daily.   spironolactone (ALDACTONE) 25 MG tablet Take 1 tablet (25 mg total) by mouth daily.   torsemide (DEMADEX) 20 MG tablet Take 2 tablets (40 mg total) by mouth daily.   No facility-administered encounter medications on file as of 09/30/2022.    Past Medical History:  Diagnosis Date   Acute diastolic HF (heart failure) (HCC) 02/08/2019   Acute respiratory failure with hypoxia (HCC) 02/08/2019   CHF (congestive heart failure) (HCC)    COPD (chronic obstructive pulmonary disease) (HCC)    HTN (hypertension)    Morbid obesity (HCC)     Past Surgical History:  Procedure Laterality Date   C-spine surgery      Family History  Problem Relation Age of Onset   Cancer Mother        Patient is not sure which type of cancer   Leukemia Father     Social History   Socioeconomic History   Marital status: Single    Spouse name: Not on file   Number of children: Not on file   Years of education: Not on file   Highest education level: Not on file  Occupational History   Not on file  Tobacco  Use   Smoking status: Never   Smokeless tobacco: Never  Vaping Use   Vaping Use: Never used  Substance and Sexual Activity   Alcohol use: Never   Drug use: Never   Sexual activity: Not on file  Other Topics Concern   Not on file  Social History Narrative   Lives with cousin for now.  Works at [employer].     Social Determinants of Health   Financial Resource Strain: Low Risk  (09/17/2022)   Overall Financial Resource Strain (CARDIA)    Difficulty of Paying Living Expenses: Not very hard  Food Insecurity: No Food Insecurity (09/13/2022)   Hunger Vital Sign    Worried About Running Out of Food in the Last Year: Never true    Ran Out of Food in the Last Year: Never true   Transportation Needs: Unmet Transportation Needs (09/29/2022)   PRAPARE - Administrator, Civil Service (Medical): Yes    Lack of Transportation (Non-Medical): Yes  Physical Activity: Not on file  Stress: Not on file  Social Connections: Not on file  Intimate Partner Violence: Not At Risk (09/13/2022)   Humiliation, Afraid, Rape, and Kick questionnaire    Fear of Current or Ex-Partner: No    Emotionally Abused: No    Physically Abused: No    Sexually Abused: No    Review of Systems  All other systems reviewed and are negative.       Objective    BP 116/78   Pulse 67   Temp 97.7 F (36.5 C) (Oral)   Resp 18   Wt (!) 379 lb 3.2 oz (172 kg)   SpO2 92%   BMI 52.89 kg/m   Physical Exam Vitals and nursing note reviewed.  Constitutional:      General: He is not in acute distress.    Appearance: He is obese.  Cardiovascular:     Rate and Rhythm: Normal rate and regular rhythm.  Pulmonary:     Effort: Pulmonary effort is normal.     Breath sounds: Normal breath sounds.  Abdominal:     Palpations: Abdomen is soft.     Tenderness: There is no abdominal tenderness.  Musculoskeletal:     Right lower leg: No edema.     Left lower leg: No edema.  Neurological:     General: No focal deficit present.     Mental Status: He is alert and oriented to person, place, and time.         Assessment & Plan:   1. Type 2 diabetes mellitus with hyperglycemia, without long-term current use of insulin (HCC) A1c is much improved and now at goal. Continue  - POCT glycosylated hemoglobin (Hb A1C) - POCT glycosylated hemoglobin (Hb A1C) - Basic Metabolic Panel  2. Hypertension, unspecified type Appears stable. continue - torsemide (DEMADEX) 20 MG tablet; Take 2 tablets (40 mg total) by mouth daily.  Dispense: 180 tablet; Refill: 1  3. Chronic diastolic CHF (congestive heart failure) (HCC) Improved. Meds refilled.  - sacubitril-valsartan (ENTRESTO) 24-26 MG; Take 1  tablet by mouth 2 (two) times daily.  Dispense: 60 tablet; Refill: 6  4. Class 3 severe obesity due to excess calories with serious comorbidity and body mass index (BMI) of 50.0 to 59.9 in adult (HCC)   5. Hospital discharge follow-up BMP obtained as recommended  6. Need for shingles vaccine  - Varicella-zoster vaccine IM  7. Need for immunization against influenza  - Flu Vaccine QUAD 57mo+IM (Fluarix, Fluzone &  Alfiuria Quad PF)    Return in about 4 months (around 01/31/2023) for follow up.   Becky Sax, MD

## 2022-10-12 ENCOUNTER — Ambulatory Visit (HOSPITAL_COMMUNITY): Payer: Self-pay

## 2022-10-27 ENCOUNTER — Ambulatory Visit (HOSPITAL_COMMUNITY)
Admission: RE | Admit: 2022-10-27 | Discharge: 2022-10-27 | Disposition: A | Discharge status: Home / Self Care | Payer: Commercial Managed Care - HMO | Source: Ambulatory Visit | Attending: Adult Health | Admitting: Adult Health

## 2022-10-27 ENCOUNTER — Telehealth (HOSPITAL_COMMUNITY): Payer: Self-pay | Admitting: *Deleted

## 2022-10-27 VITALS — BP 130/80 | HR 94 | Wt 373.6 lb

## 2022-10-27 DIAGNOSIS — E785 Hyperlipidemia, unspecified: Secondary | ICD-10-CM | POA: Diagnosis not present

## 2022-10-27 DIAGNOSIS — J9602 Acute respiratory failure with hypercapnia: Secondary | ICD-10-CM | POA: Insufficient documentation

## 2022-10-27 DIAGNOSIS — I5032 Chronic diastolic (congestive) heart failure: Secondary | ICD-10-CM | POA: Diagnosis present

## 2022-10-27 DIAGNOSIS — G4733 Obstructive sleep apnea (adult) (pediatric): Secondary | ICD-10-CM | POA: Insufficient documentation

## 2022-10-27 DIAGNOSIS — Z6841 Body Mass Index (BMI) 40.0 and over, adult: Secondary | ICD-10-CM | POA: Diagnosis not present

## 2022-10-27 DIAGNOSIS — E119 Type 2 diabetes mellitus without complications: Secondary | ICD-10-CM | POA: Diagnosis not present

## 2022-10-27 DIAGNOSIS — Z79899 Other long term (current) drug therapy: Secondary | ICD-10-CM | POA: Insufficient documentation

## 2022-10-27 DIAGNOSIS — I110 Hypertensive heart disease with heart failure: Secondary | ICD-10-CM | POA: Insufficient documentation

## 2022-10-27 DIAGNOSIS — Z7982 Long term (current) use of aspirin: Secondary | ICD-10-CM | POA: Insufficient documentation

## 2022-10-27 DIAGNOSIS — Z7984 Long term (current) use of oral hypoglycemic drugs: Secondary | ICD-10-CM | POA: Diagnosis not present

## 2022-10-27 DIAGNOSIS — I10 Essential (primary) hypertension: Secondary | ICD-10-CM

## 2022-10-27 NOTE — Patient Instructions (Addendum)
EKG done today.   No Labs done today.   Take an extra 20mg  (1 tablet) of Torsemide as needed for swelling or a weight gain of 3 pounds or more in 24 hours.   No other medication changes were made. Please continue all current medications as prescribed.  Thank you for allowing [place] to provide your heart failure care after your recent hospitalization. Please follow-up with Dr. Percival Spanish

## 2022-10-27 NOTE — Progress Notes (Signed)
HEART IMPACT TRANSITIONS OF CARE    PCP: Dr Georganna Skeans  Primary Cardiologist: Dr Leonidas Lions   HPI: Mr Edwin Mcclain is a 56 y/o AAM w/ chronic diastolic heart failure, HTN, OSA intolerant of CPAP, obesity, HTN and HLD  Admitted 9/23 w/ AMS and severe respiratory distress and found to be in acute hypoxic/hypercarbic respiratory failure. Failred BiPAP in the ED and required intubation and admitted to ICU. CXR showed pulmonary vascular congestion. CT of chest negative for PE and showed bibascilar atelectasis. He was diuresed w/ IV Lasix and treated w/ bronchodilators and systemic steroids and sucessfully extubated. 2D echo showed preserved LVEF 60-65%, GIIDD, RV normal. No RVSP estimation. Volume status improved w/ diuresis. He diuresed 9.3L and transitioned to PO torsemide. Placed on losartan, Farxiga, and spironolactone. TOC consulted for assistance w/ home BiPAP. Referred to [facility] clinic. D/c wt 369.  He was seen in HF [facility] 109/23. Switched from losartan to entresto.   He was  Today he returns for HF follow up.Overall feeling fine.  Takes breaks when walking.  Denies PND/Orthopnea. Walking outside with his dog. Eating smaller portions.  Appetite ok. No fever or chills. He weighs but cant remember what his weight is. Using Bipap every night. Taking all medications.  Cardiac Testing.  Echo 08/2022 EF 60-65% Grade II DD, RV normal.   ROS: All systems negative except as listed in HPI, PMH and Problem List.  SH:  Social History   Socioeconomic History   Marital status: Single    Spouse name: Not on file   Number of children: Not on file   Years of education: Not on file   Highest education level: Not on file  Occupational History   Not on file  Tobacco Use   Smoking status: Never   Smokeless tobacco: Never  Vaping Use   Vaping Use: Never used  Substance and Sexual Activity   Alcohol use: Never   Drug use: Never   Sexual activity: Not on file  Other Topics Concern   Not on file   Social History Narrative   Lives with cousin for now.  Works at [employer].     Social Determinants of Health   Financial Resource Strain: Low Risk  (09/17/2022)   Overall Financial Resource Strain (CARDIA)    Difficulty of Paying Living Expenses: Not very hard  Food Insecurity: No Food Insecurity (09/13/2022)   Hunger Vital Sign    Worried About Running Out of Food in the Last Year: Never true    Ran Out of Food in the Last Year: Never true  Transportation Needs: Unmet Transportation Needs (09/29/2022)   PRAPARE - Administrator, Civil Service (Medical): Yes    Lack of Transportation (Non-Medical): Yes  Physical Activity: Not on file  Stress: Not on file  Social Connections: Not on file  Intimate Partner Violence: Not At Risk (09/13/2022)   Humiliation, Afraid, Rape, and Kick questionnaire    Fear of Current or Ex-Partner: No    Emotionally Abused: No    Physically Abused: No    Sexually Abused: No    FH:  Family History  Problem Relation Age of Onset   Cancer Mother        Patient is not sure which type of cancer   Leukemia Father     Past Medical History:  Diagnosis Date   Acute diastolic HF (heart failure) (HCC) 02/08/2019   Acute respiratory failure with hypoxia (HCC) 02/08/2019   CHF (congestive heart failure) (  HCC)    COPD (chronic obstructive pulmonary disease) (HCC)    HTN (hypertension)    Morbid obesity (HCC)     Current Outpatient Medications  Medication Sig Dispense Refill   Accu-Chek Softclix Lancets lancets Use to check blood sugar once daily. 100 each 2   aspirin 81 MG EC tablet Take 1 tablet (81 mg total) by mouth daily. 30 tablet    atorvastatin (LIPITOR) 10 MG tablet Take 1 tablet (10 mg total) by mouth daily. 90 tablet 1   carvedilol (COREG) 25 MG tablet Take 1 tablet (25 mg total) by mouth 2 (two) times daily with a meal. 180 tablet 1   dapagliflozin propanediol (FARXIGA) 10 MG TABS tablet Take 1 tablet (10 mg total) by mouth daily before  breakfast. 30 tablet 5   glucose blood (ACCU-CHEK GUIDE) test strip Use to check blood sugar once daily. 100 each 2   iron polysaccharides (NIFEREX) 150 MG capsule Take 150 mg by mouth daily.     Melatonin 10 MG TABS Take 1 tablet by mouth daily as needed (sleep).     sacubitril-valsartan (ENTRESTO) 24-26 MG Take 1 tablet by mouth 2 (two) times daily. 60 tablet 6   sitaGLIPtin (JANUVIA) 25 MG tablet Take 1 tablet (25 mg total) by mouth daily. 90 tablet 1   spironolactone (ALDACTONE) 25 MG tablet Take 1 tablet (25 mg total) by mouth daily. 90 tablet 1   torsemide (DEMADEX) 20 MG tablet Take 2 tablets (40 mg total) by mouth daily. 180 tablet 1   vitamin B-12 1000 MCG tablet Take 1 tablet (1,000 mcg total) by mouth daily. 30 tablet 0   No current facility-administered medications for this encounter.    Vitals:   10/27/22 1050  BP: 130/80  Pulse: 94  SpO2: 93%  Weight: (!) 169.5 kg (373 lb 9.6 oz)   Wt Readings from Last 3 Encounters:  10/27/22 (!) 169.5 kg (373 lb 9.6 oz)  09/30/22 (!) 172 kg (379 lb 3.2 oz)  09/28/22 (!) 171.5 kg (378 lb)    PHYSICAL EXAM: General:  Walked in the clinic. ell appearing. No resp difficulty HEENT: normal Neck: supple. JVP difficult to assess due to body habitus.  Carotids 2+ bilaterally; no bruits. No lymphadenopathy or thryomegaly appreciated. Cor: PMI normal. Regular rate & rhythm. No rubs, gallops or murmurs. Lungs: clear Abdomen: obese, soft, nontender, nondistended. No hepatosplenomegaly. No bruits or masses. Good bowel sounds. Extremities: no cyanosis, clubbing, rash, edema Neuro: alert & orientedx3, cranial nerves grossly intact. Moves all 4 extremities w/o difficulty. Affect pleasant.   ECG: SR 88 bpm personally checked.    ASSESSMENT & PLAN: 1. Chronic Diastolic Heart Failure - Echo 2/20 EF 60-65%, GIIDD, RV normal -NYHA III, chronically  - Volume status stable. Continue current diuretic regimen and instructed to take extra 20 mg  torsemide for 3 pound weight gain in 24 hours.   -Entresto 24-26 mg bid - Continue Farxiga 10 mg daily  - Continue Spironolactone 25 mg daily  - Continue Torsemide 40 mg daily  - Continue Coreg 25 mg bid  - Renal function stable.  - Discussed low salt food choices and limiting fluid intake to < 2 liters per day.  -      2. OSA/ OHS  - recent admit for hypercarbic respiratory failure due to poor compliance w/ CPAP, leading to mechanical ventilation  - Echo w/ normal RV. 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  3. Hypertension  - Controlled    4. Obesity  Body mass index is 52.11 kg/m. - Discussed portion control.     5. Type 2DM  - Hgb A1c 9.8 - On Farxiga and Januvia - managed by PCP   Follow up as needed. Needs f/u with Dr Percival Spanish.   Wynelle Dreier NP-C   11:30 AM

## 2022-10-27 NOTE — Telephone Encounter (Signed)
Called to confirm Heart & Vascular Transitions of Care appointment at 11 am on 10/27/22. Patient reminded to bring all medications and pill box organizer with them. Confirmed patient has transportation. Gave directions, instructed to utilize [address] parking.  Confirmed appointment prior to ending call.    Earnestine Leys, BSN, Clinical cytogeneticist Only

## 2022-10-29 ENCOUNTER — Other Ambulatory Visit (HOSPITAL_COMMUNITY): Payer: Self-pay

## 2022-11-04 ENCOUNTER — Other Ambulatory Visit (HOSPITAL_COMMUNITY): Payer: Self-pay

## 2023-01-28 ENCOUNTER — Ambulatory Visit (INDEPENDENT_AMBULATORY_CARE_PROVIDER_SITE_OTHER): Payer: Self-pay | Admitting: Family Medicine

## 2023-01-28 VITALS — BP 135/86 | HR 96 | Temp 98.1°F | Resp 18 | Wt 394.8 lb

## 2023-01-28 DIAGNOSIS — Z6841 Body Mass Index (BMI) 40.0 and over, adult: Secondary | ICD-10-CM

## 2023-01-28 DIAGNOSIS — E1165 Type 2 diabetes mellitus with hyperglycemia: Secondary | ICD-10-CM

## 2023-01-28 DIAGNOSIS — Z1211 Encounter for screening for malignant neoplasm of colon: Secondary | ICD-10-CM

## 2023-01-29 ENCOUNTER — Encounter: Payer: Self-pay | Admitting: Family Medicine

## 2023-01-29 LAB — POCT GLYCOSYLATED HEMOGLOBIN (HGB A1C): Hemoglobin A1C: 10.2 % — AB (ref 4.0–5.6)

## 2023-01-29 NOTE — Progress Notes (Signed)
Established Patient Office Visit  Subjective    Patient ID: Edwin Mcclain, male    DOB: 04-Jun-1966  Age: 57 y.o. MRN: [id number]  CC:  Chief Complaint  Patient presents with   Follow-up   Diabetes    HPI Edwin Mcclain presents for routine follow up of chronic med issues. Patient denies acute complaints.    Outpatient Encounter Medications as of 01/28/2023  Medication Sig   Accu-Chek Softclix Lancets lancets Use to check blood sugar once daily.   aspirin 81 MG EC tablet Take 1 tablet (81 mg total) by mouth daily.   atorvastatin (LIPITOR) 10 MG tablet Take 1 tablet (10 mg total) by mouth daily.   carvedilol (COREG) 25 MG tablet Take 1 tablet (25 mg total) by mouth 2 (two) times daily with a meal.   dapagliflozin propanediol (FARXIGA) 10 MG TABS tablet Take 1 tablet (10 mg total) by mouth daily before breakfast.   glucose blood (ACCU-CHEK GUIDE) test strip Use to check blood sugar once daily.   iron polysaccharides (NIFEREX) 150 MG capsule Take 150 mg by mouth daily.   Melatonin 10 MG TABS Take 1 tablet by mouth daily as needed (sleep).   sacubitril-valsartan (ENTRESTO) 24-26 MG Take 1 tablet by mouth 2 (two) times daily.   sitaGLIPtin (JANUVIA) 25 MG tablet Take 1 tablet (25 mg total) by mouth daily.   spironolactone (ALDACTONE) 25 MG tablet Take 1 tablet (25 mg total) by mouth daily.   torsemide (DEMADEX) 20 MG tablet Take 2 tablets (40 mg total) by mouth daily.   vitamin B-12 1000 MCG tablet Take 1 tablet (1,000 mcg total) by mouth daily.   No facility-administered encounter medications on file as of 01/28/2023.    Past Medical History:  Diagnosis Date   Acute diastolic HF (heart failure) ([place]) 02/08/2019   Acute respiratory failure with hypoxia ([place]) 02/08/2019   CHF (congestive heart failure) (HCC)    COPD (chronic obstructive pulmonary disease) (HCC)    HTN (hypertension)    Morbid obesity (HCC)     Past Surgical History:  Procedure Laterality Date   C-spine surgery       Family History  Problem Relation Age of Onset   Cancer Mother        Patient is not sure which type of cancer   Leukemia Father     Social History   Socioeconomic History   Marital status: Single    Spouse name: Not on file   Number of children: Not on file   Years of education: Not on file   Highest education level: Not on file  Occupational History   Not on file  Tobacco Use   Smoking status: Never   Smokeless tobacco: Never  Vaping Use   Vaping Use: Never used  Substance and Sexual Activity   Alcohol use: Never   Drug use: Never   Sexual activity: Not on file  Other Topics Concern   Not on file  Social History Narrative   Lives with cousin for now.  Works at [employer].     Social Determinants of Health   Financial Resource Strain: Low Risk  (09/17/2022)   Overall Financial Resource Strain (CARDIA)    Difficulty of Paying Living Expenses: Not very hard  Food Insecurity: No Food Insecurity (09/13/2022)   Hunger Vital Sign    Worried About Running Out of Food in the Last Year: Never true    Ran Out of Food in the Last Year: Never true  Transportation Needs:  Unmet Transportation Needs (09/29/2022)   PRAPARE - Hydrologist (Medical): Yes    Lack of Transportation (Non-Medical): Yes  Physical Activity: Not on file  Stress: Not on file  Social Connections: Not on file  Intimate Partner Violence: Not At Risk (09/13/2022)   Humiliation, Afraid, Rape, and Kick questionnaire    Fear of Current or Ex-Partner: No    Emotionally Abused: No    Physically Abused: No    Sexually Abused: No    Review of Systems  All other systems reviewed and are negative.       Objective    BP 135/86   Pulse 96   Temp 98.1 F (36.7 C) (Oral)   Resp 18   Wt (!) 394 lb 12.8 oz (179.1 kg)   SpO2 94%   BMI 55.06 kg/m   Physical Exam Vitals and nursing note reviewed.  Constitutional:      General: He is not in acute distress.    Appearance: He is  obese.  Cardiovascular:     Rate and Rhythm: Normal rate and regular rhythm.  Pulmonary:     Effort: Pulmonary effort is normal.     Breath sounds: Normal breath sounds.  Abdominal:     Palpations: Abdomen is soft.     Tenderness: There is no abdominal tenderness.  Musculoskeletal:     Right lower leg: No edema.     Left lower leg: No edema.  Neurological:     General: No focal deficit present.     Mental Status: He is alert and oriented to person, place, and time.         Assessment & Plan:   1. Uncontrolled type 2 diabetes mellitus with hyperglycemia (HCC) Greatly increased A1c. Discussed compliance. Discussed dietary and activity options.Unfortunately, patient has relocated and will be transferring care closer to where he now lives.  - POCT glycosylated hemoglobin (Hb A1C) - Microalbumin / creatinine urine ratio  2. Screening for colon cancer  - Cologuard  3. Class 3 severe obesity due to excess calories with serious comorbidity and body mass index (BMI) of 50.0 to 59.9 in adult [facility])     No follow-ups on file.   Becky Sax, MD

## 2023-01-30 LAB — MICROALBUMIN / CREATININE URINE RATIO
Creatinine, Urine: 119.7 mg/dL
Microalb/Creat Ratio: 79 mg/g creat — ABNORMAL HIGH (ref 0–29)
Microalbumin, Urine: 94.5 ug/mL

## 2023-02-01 ENCOUNTER — Ambulatory Visit: Payer: Self-pay | Admitting: Nurse Practitioner

## 2023-02-03 ENCOUNTER — Encounter: Payer: Self-pay | Admitting: *Deleted

## 2023-02-15 ENCOUNTER — Other Ambulatory Visit: Payer: Self-pay | Admitting: Family Medicine

## 2023-03-03 ENCOUNTER — Ambulatory Visit: Payer: Medicaid Other | Admitting: General Practice

## 2023-04-05 NOTE — Progress Notes (Deleted)
Cardiology Clinic Note   Patient Name: Edwin Mcclain Date of Encounter: 04/05/2023  Primary Care Provider:  Georganna Skeans, MD Primary Cardiologist:  Rollene Rotunda, MD  Patient Profile    Edwin Mcclain 57 year old male presents the clinic today for follow-up evaluation of his acute on chronic diastolic CHF and essential hypertension.  Past Medical History    Past Medical History:  Diagnosis Date   Acute diastolic HF (heart failure) (HCC) 02/08/2019   Acute respiratory failure with hypoxia (HCC) 02/08/2019   CHF (congestive heart failure) (HCC)    COPD (chronic obstructive pulmonary disease) (HCC)    HTN (hypertension)    Morbid obesity (HCC)    Past Surgical History:  Procedure Laterality Date   C-spine surgery      Allergies  Allergies  Allergen Reactions   Metformin And Related Diarrhea    History of Present Illness    Edwin Mcclain has a PMH of HTN, chronic diastolic CHF, acute COPD exacerbation, type 2 diabetes, AKI, morbid obesity, anemia, morbid obesity and shortness of breath.  He was seen by Dr. Antoine Poche on 03/11/2021.  He complained of lower extremity edema.  Dr. Antoine Poche found him to be euvolemic with no evidence of significant fluid volume overload.  His blood pressure was not optimal and his carvedilol was increased to 25 mg twice daily.  He was seen by his PCP 10/30/2021 and his blood pressure was 121 over 70s.  He was seen in follow-up by Bailey Mech, DNP on 12/19/2021.  During that time he had no cardiac complaints.  He had not yet taken his medications that day.  His blood pressure was elevated.  He did note occasional cough with some phlegm.  He denied fever chills and significant chest congestion.  He was doing well with weight loss and lost around 28 pounds.  He was trying to eliminate soft drinks.  He was working on more strict low-sodium diet and increasing physical activity.  He reported that his sister is a Publishing rights manager at [employer]  and had been coaching him to become more active.  He presented to the clinic 07/06/22 for follow-up evaluation stated he felt fairly well.  He presented to the clinic  with 3 L of oxygen nasal cannula.  He reported that he was ordered to start this due to his respiratory status.  He was saturating in the 95% range .  He did not need to increase his oxygen with increased physical activity.  He reported that he was disappointed due to his weight increase back to 400 pounds.  He was working on reducing his soft drinks.  He was  drinking around 4 Pepsi cans of per day.    We reviewed the importance of heart healthy diet.  We reviewed his previous echocardiogram he expressed understanding.  I ordered a CBC, BMP, repeat echocardiogram and plan follow-up in 6 months.  He was seen in follow-up by the advanced heart failure team on 09/28/2022 and again on 10/27/2022.  He had been switched from losartan to [facility].  He continued to feel well.  He was walking but taking breaks.  He denied orthopnea and PND.  He was eating smaller portions.  His appetite was okay.  He denied fever or chills.  He was weighing but was unable to recount his weights.  He was using his BiPAP every night.  He reported compliance with his medications.  He presents to the clinic today for follow-up evaluation and states***.  Today he denies  chest pain, increased shortness of breath, lower extremity edema, fatigue, palpitations, melena, hematuria, hemoptysis, diaphoresis, weakness, presyncope, syncope, orthopnea, and PND.   Home Medications    Prior to Admission medications   Medication Sig Start Date End Date Taking? Authorizing Provider  Accu-Chek Softclix Lancets lancets Use to check blood sugar once daily. 03/30/22   Hoy Register, MD  aspirin 81 MG EC tablet Take 1 tablet (81 mg total) by mouth daily. 12/12/19   Anders Simmonds, PA-C  atorvastatin (LIPITOR) 10 MG tablet Take 1 tablet (10 mg total) by mouth daily. 03/30/22   Hoy Register, MD  Blood Glucose Monitoring Suppl (ACCU-CHEK GUIDE) w/Device KIT Use to check blood sugar once daily. 03/30/22   Hoy Register, MD  carvedilol (COREG) 25 MG tablet Take 1 tablet (25 mg total) by mouth 2 (two) times daily with a meal. 03/30/22   Hoy Register, MD  dapagliflozin propanediol (FARXIGA) 10 MG TABS tablet Take 1 tablet (10 mg total) by mouth daily before breakfast. 03/30/22   Hoy Register, MD  glucose blood (ACCU-CHEK GUIDE) test strip Use to check blood sugar once daily. 03/30/22   Hoy Register, MD  iron polysaccharides (NIFEREX) 150 MG capsule Take 150 mg by mouth daily. 07/18/19   [provider]  Lancet Devices (ONE TOUCH DELICA LANCING DEV) MISC Use as directed. Dx: E10.9, E11.9 10/17/19   Marcine Matar, MD  losartan (COZAAR) 25 MG tablet TAKE 1 TABLET(25 MG) BY MOUTH DAILY 03/30/22   Hoy Register, MD  Melatonin 10 MG TABS Take 1 tablet by mouth at bedtime as needed (sleep).    [provider]  sitaGLIPtin (JANUVIA) 25 MG tablet Take 1 tablet (25 mg total) by mouth daily. 04/29/21   Rema Fendt, NP  torsemide (DEMADEX) 20 MG tablet Take 2 tablets (40 mg total) by mouth daily. 03/30/22   Hoy Register, MD  vitamin B-12 1000 MCG tablet Take 1 tablet (1,000 mcg total) by mouth daily. 04/05/21   Ghimire, Werner Lean, MD    Family History    Family History  Problem Relation Age of Onset   Cancer Mother        Patient is not sure which type of cancer   Leukemia Father    He indicated that his mother is alive. He indicated that his father is deceased.  Social History    Social History   Socioeconomic History   Marital status: Single    Spouse name: Not on file   Number of children: Not on file   Years of education: Not on file   Highest education level: Not on file  Occupational History   Not on file  Tobacco Use   Smoking status: Never   Smokeless tobacco: Never  Vaping Use   Vaping Use: Never used  Substance and Sexual  Activity   Alcohol use: Never   Drug use: Never   Sexual activity: Not on file  Other Topics Concern   Not on file  Social History Narrative   Lives with cousin for now.  Works at [employer].     Social Determinants of Health   Financial Resource Strain: Low Risk  (09/17/2022)   Overall Financial Resource Strain (CARDIA)    Difficulty of Paying Living Expenses: Not very hard  Food Insecurity: No Food Insecurity (09/13/2022)   Hunger Vital Sign    Worried About Running Out of Food in the Last Year: Never true    Ran Out of Food in the Last Year:  Never true  Transportation Needs: Unmet Transportation Needs (09/29/2022)   PRAPARE - Administrator, Civil Service (Medical): Yes    Lack of Transportation (Non-Medical): Yes  Physical Activity: Not on file  Stress: Not on file  Social Connections: Not on file  Intimate Partner Violence: Not At Risk (09/13/2022)   Humiliation, Afraid, Rape, and Kick questionnaire    Fear of Current or Ex-Partner: No    Emotionally Abused: No    Physically Abused: No    Sexually Abused: No     Review of Systems    General:  No chills, fever, night sweats or weight changes.  Cardiovascular:  No chest pain, dyspnea on exertion, bilateral lower extremity woody edema, orthopnea, palpitations, paroxysmal nocturnal dyspnea. Dermatological: No rash, lesions/masses Respiratory: No cough, dyspnea Urologic: No hematuria, dysuria Abdominal:   No nausea, vomiting, diarrhea, bright red blood per rectum, melena, or hematemesis Neurologic:  No visual changes, wkns, changes in mental status. All other systems reviewed and are otherwise negative except as noted above.  Physical Exam    VS:  There were no vitals taken for this visit. , BMI There is no height or weight on file to calculate BMI. GEN: Well nourished, well developed, in no acute distress. HEENT: normal. Neck: Supple, no JVD, carotid bruits, or masses. Cardiac: RRR, no murmurs, rubs, or gallops. No  clubbing, cyanosis, edema.  Radials/DP/PT 2+ and equal bilaterally.  Respiratory:  Respirations regular and unlabored, clear to auscultation bilaterally. GI: Soft, nontender, nondistended, BS + x 4. MS: no deformity or atrophy. Skin: warm and dry, no rash. Neuro:  Strength and sensation are intact. Psych: Normal affect.  Accessory Clinical Findings    Recent Labs: 09/12/2022: B Natriuretic Peptide 427.0 09/13/2022: ALT 47; Magnesium 2.3 09/16/2022: Hemoglobin 12.3; Platelets 195 09/30/2022: BUN 8; Creatinine, Ser 0.92; Potassium 4.7; Sodium 146   Recent Lipid Panel    Component Value Date/Time   CHOL 165 05/29/2019 0953   TRIG 122 09/13/2022 0131   HDL 44 05/29/2019 0953   CHOLHDL 3.8 05/29/2019 0953   LDLCALC 98 05/29/2019 0953    ECG personally reviewed by me today-none today.  Echocardiogram 04/03/2021  IMPRESSIONS     1. Left ventricular ejection fraction, by estimation, is 55 to 60%. The  left ventricle has normal function. The left ventricle has no regional  wall motion abnormalities. Left ventricular diastolic parameters were  normal.   2. Right ventricular systolic function was not well visualized. The right  ventricular size is not well visualized. There is normal pulmonary artery  systolic pressure.   3. The mitral valve is normal in structure. No evidence of mitral valve  regurgitation. No evidence of mitral stenosis.   4. The aortic valve was not well visualized. Aortic valve regurgitation  is not visualized. No aortic stenosis is present.   5. Aortic dilatation noted. There is mild dilatation of the aortic root,  measuring 40 mm.   6. No complete TR doppler jet so unable to estimate PA systolic pressure.   FINDINGS   Left Ventricle: Left ventricular ejection fraction, by estimation, is 55  to 60%. The left ventricle has normal function. The left ventricle has no  regional wall motion abnormalities. The left ventricular internal cavity  size was normal in  size. There is   no left ventricular hypertrophy. Left ventricular diastolic parameters  were normal.   Right Ventricle: The right ventricular size is not well visualized. Right  vetricular wall thickness was not well visualized.  Right ventricular  systolic function was not well visualized. There is normal pulmonary  artery systolic pressure. The tricuspid  regurgitant velocity is 2.58 m/s, and with an assumed right atrial  pressure of 3 mmHg, the estimated right ventricular systolic pressure is  29.6 mmHg.   Left Atrium: Left atrial size was normal in size.   Right Atrium: Right atrial size was not well visualized.   Pericardium: Trivial pericardial effusion is present.   Mitral Valve: The mitral valve is normal in structure. Mild mitral annular  calcification. No evidence of mitral valve regurgitation. No evidence of  mitral valve stenosis.   Tricuspid Valve: The tricuspid valve is normal in structure. Tricuspid  valve regurgitation is trivial.   Aortic Valve: The aortic valve was not well visualized. Aortic valve  regurgitation is not visualized. No aortic stenosis is present. Aortic  valve mean gradient measures 8.0 mmHg. Aortic valve peak gradient measures  13.1 mmHg. Aortic valve area, by VTI  measures 4.97 cm.   Pulmonic Valve: The pulmonic valve was not well visualized. Pulmonic valve  regurgitation is not visualized.   Aorta: Aortic dilatation noted. There is mild dilatation of the aortic  root, measuring 40 mm.   Echocardiogram 09/13/2022  IMPRESSIONS     1. Left ventricular ejection fraction, by estimation, is 60 to 65%. The  left ventricle has normal function. The left ventricle has no regional  wall motion abnormalities. There is mild left ventricular hypertrophy.  Left ventricular diastolic parameters  are consistent with Grade II diastolic dysfunction (pseudonormalization).   2. Right ventricular systolic function is normal. The right ventricular  size  is normal.   3. The mitral valve is normal in structure. No evidence of mitral valve  regurgitation. No evidence of mitral stenosis.   4. The aortic valve is tricuspid. Aortic valve regurgitation is not  visualized. No aortic stenosis is present.   5. The inferior vena cava is normal in size with greater than 50%  respiratory variability, suggesting right atrial pressure of 3 mmHg.   FINDINGS   Left Ventricle: Left ventricular ejection fraction, by estimation, is 60  to 65%. The left ventricle has normal function. The left ventricle has no  regional wall motion abnormalities. Definity contrast agent was given IV  to delineate the left ventricular   endocardial borders. The left ventricular internal cavity size was normal  in size. There is mild left ventricular hypertrophy. Left ventricular  diastolic parameters are consistent with Grade II diastolic dysfunction  (pseudonormalization).   Right Ventricle: The right ventricular size is normal. No increase in  right ventricular wall thickness. Right ventricular systolic function is  normal.   Left Atrium: Left atrial size was normal in size.   Right Atrium: Right atrial size was normal in size.   Pericardium: There is no evidence of pericardial effusion.   Mitral Valve: The mitral valve is normal in structure. No evidence of  mitral valve regurgitation. No evidence of mitral valve stenosis.   Tricuspid Valve: The tricuspid valve is normal in structure. Tricuspid  valve regurgitation is not demonstrated. No evidence of tricuspid  stenosis.   Aortic Valve: The aortic valve is tricuspid. Aortic valve regurgitation is  not visualized. No aortic stenosis is present.   Pulmonic Valve: The pulmonic valve was normal in structure. Pulmonic valve  regurgitation is not visualized. No evidence of pulmonic stenosis.   Aorta: The aortic root is normal in size and structure.   Venous: The inferior vena cava is normal in  size with greater  than 50%  respiratory variability, suggesting right atrial pressure of 3 mmHg.   IAS/Shunts: The interatrial septum was not well visualized.    Assessment & Plan   1.  Diastolic CHF-weight stable.  Euvolemic.   Bilateral lower extremity woody edema.  NYHA class III, stable. Continue carvedilol, Farxiga, Entresto, torsemide, spironolactone Heart healthy low-sodium diet-salty 6 reviewed Increase physical activity as tolerated  Essential hypertension-BP today***  114/73.   Continue carvedilol, Entresto Heart healthy low-sodium diet Increase physical activity as tolerated Maintain blood pressure log  Obstructive sleep apnea-reports compliance with CPAP.  Waking up well rested. Continue CPAP use. Continue weight loss Continue sleep habits  Morbid obesity-weight today***400.8 .  Continues to work on improving diet and physical activity.    Continue weight loss Continue heart healthy low-sodium diet  Aortic root dilation-denies episodes of back discomfort.  Echocardiogram 04/03/2021 showed aortic root measuring 40 mm.  Repeat echocardiogram 09/13/2022 showed no aortic root aneurysm.  Follow-up with Dr. Antoine Poche or me in 6 months.   Thomasene Ripple. Cabell Lazenby NP-C     04/05/2023, 4:32 PM [facility] [address] Office [phone] Fax [phone]  Notice: This dictation was prepared with Dragon dictation along with smaller phrase technology. Any transcriptional errors that result from this process are unintentional and may not be corrected upon review.  I spent 13 ***minutes examining this patient, reviewing medications, and using patient centered shared decision making involving her cardiac care.  Prior to her visit I spent greater than 20 minutes reviewing her past medical history,  medications, and prior cardiac tests.

## 2023-04-07 ENCOUNTER — Ambulatory Visit: Payer: Medicaid Other | Admitting: General Practice

## 2023-04-07 ENCOUNTER — Telehealth: Payer: Self-pay

## 2023-04-07 NOTE — Telephone Encounter (Signed)
[id number] due to a scheduling error, we changed Mr Heldman's appointment to next week, 4-23 at 245pm.  I have left 3 detailed messages.

## 2023-04-13 ENCOUNTER — Ambulatory Visit: Payer: Medicaid Other | Admitting: General Practice

## 2023-06-01 NOTE — Progress Notes (Deleted)
Cardiology Clinic Note   Patient Name: Edwin Mcclain Date of Encounter: 06/01/2023  Primary Care Provider:  Georganna Skeans, MD Primary Cardiologist:  Rollene Rotunda, MD  Patient Profile    Edwin Mcclain 57 year old male presents the clinic today for follow-up evaluation of his acute on chronic diastolic CHF and essential hypertension.  Past Medical History    Past Medical History:  Diagnosis Date   Acute diastolic HF (heart failure) (HCC) 02/08/2019   Acute respiratory failure with hypoxia (HCC) 02/08/2019   CHF (congestive heart failure) (HCC)    COPD (chronic obstructive pulmonary disease) (HCC)    HTN (hypertension)    Morbid obesity (HCC)    Past Surgical History:  Procedure Laterality Date   C-spine surgery      Allergies  Allergies  Allergen Reactions   Metformin And Related Diarrhea    History of Present Illness    [name] has a PMH of HTN, chronic diastolic CHF, acute COPD exacerbation, type 2 diabetes, AKI, morbid obesity, anemia, morbid obesity and shortness of breath.  He was seen by Dr. Antoine Poche on 03/11/2021.  He complained of lower extremity edema.  Dr. Antoine Poche found him to be euvolemic with no evidence of significant fluid volume overload.  His blood pressure was not optimal and his carvedilol was increased to 25 mg twice daily.  He was seen by his PCP 10/30/2021 and his blood pressure was 121 over 70s.  He was seen in follow-up by Bailey Mech, DNP on 12/19/2021.  During that time he had no cardiac complaints.  He had not yet taken his medications that day.  His blood pressure was elevated.  He did note occasional cough with some phlegm.  He denied fever chills and significant chest congestion.  He was doing well with weight loss and lost around 28 pounds.  He was trying to eliminate soft drinks.  He was working on more strict low-sodium diet and increasing physical activity.  He reported that his sister is a Publishing rights manager at [employer]  and had been coaching him to become more active.  He presented to the clinic  07/06/22 for follow-up evaluation stated he felt fairly well.  He presented to the clinic  with 3 L of oxygen nasal cannula.  He reported that he was ordered to start  due to his respiratory status.  He was saturating in the 95% range today.  He does not need to increase his oxygen with increased physical activity.  He reports that he is disappointed due to his weight increase back to 400 pounds.  He was working on reducing his soft drinks.  He was  drinking around 4 Pepsi cans of per day.  He felt it was a significant improvement from his previous consumption.  We reviewed the importance of heart healthy diet.  We reviewed his previous echocardiogram he expressed understanding.  I planned follow-up in 6 months.  He was seen in follow-up at the advanced heart failure clinic 11 [phone].  Overall he was stable from a cardiac standpoint.  He was taking breaks with walking.  He denied orthopnea and PND.  He was walking his dog outside.  He was trying to eat smaller portions.  He denied fever and chills.  He could not remember his weight.  He reported compliance with his BiPAP and medications.  He presents to the clinic today for follow-up evaluation and states***.  Today he denies chest pain, increased shortness of breath, lower extremity edema, fatigue, palpitations, melena,  hematuria, hemoptysis, diaphoresis, weakness, presyncope, syncope, orthopnea, and PND.   Home Medications    Prior to Admission medications   Medication Sig Start Date End Date Taking? Authorizing Provider  Accu-Chek Softclix Lancets lancets Use to check blood sugar once daily. 03/30/22   Hoy Register, MD  aspirin 81 MG EC tablet Take 1 tablet (81 mg total) by mouth daily. 12/12/19   Anders Simmonds, PA-C  atorvastatin (LIPITOR) 10 MG tablet Take 1 tablet (10 mg total) by mouth daily. 03/30/22   Hoy Register, MD  Blood Glucose Monitoring Suppl  (ACCU-CHEK GUIDE) w/Device KIT Use to check blood sugar once daily. 03/30/22   Hoy Register, MD  carvedilol (COREG) 25 MG tablet Take 1 tablet (25 mg total) by mouth 2 (two) times daily with a meal. 03/30/22   Hoy Register, MD  dapagliflozin propanediol (FARXIGA) 10 MG TABS tablet Take 1 tablet (10 mg total) by mouth daily before breakfast. 03/30/22   Hoy Register, MD  glucose blood (ACCU-CHEK GUIDE) test strip Use to check blood sugar once daily. 03/30/22   Hoy Register, MD  iron polysaccharides (NIFEREX) 150 MG capsule Take 150 mg by mouth daily. 07/18/19   [provider]  Lancet Devices (ONE TOUCH DELICA LANCING DEV) MISC Use as directed. Dx: E10.9, E11.9 10/17/19   Marcine Matar, MD  losartan (COZAAR) 25 MG tablet TAKE 1 TABLET(25 MG) BY MOUTH DAILY 03/30/22   Hoy Register, MD  Melatonin 10 MG TABS Take 1 tablet by mouth at bedtime as needed (sleep).    [provider]  sitaGLIPtin (JANUVIA) 25 MG tablet Take 1 tablet (25 mg total) by mouth daily. 04/29/21   Rema Fendt, NP  torsemide (DEMADEX) 20 MG tablet Take 2 tablets (40 mg total) by mouth daily. 03/30/22   Hoy Register, MD  vitamin B-12 1000 MCG tablet Take 1 tablet (1,000 mcg total) by mouth daily. 04/05/21   Ghimire, Werner Lean, MD    Family History    Family History  Problem Relation Age of Onset   Cancer Mother        Patient is not sure which type of cancer   Leukemia Father    He indicated that his mother is alive. He indicated that his father is deceased.  Social History    Social History   Socioeconomic History   Marital status: Single    Spouse name: Not on file   Number of children: Not on file   Years of education: Not on file   Highest education level: Not on file  Occupational History   Not on file  Tobacco Use   Smoking status: Never   Smokeless tobacco: Never  Vaping Use   Vaping Use: Never used  Substance and Sexual Activity   Alcohol use: Never   Drug use: Never    Sexual activity: Not on file  Other Topics Concern   Not on file  Social History Narrative   Lives with cousin for now.  Works at [employer].     Social Determinants of Health   Financial Resource Strain: Low Risk  (09/17/2022)   Overall Financial Resource Strain (CARDIA)    Difficulty of Paying Living Expenses: Not very hard  Food Insecurity: No Food Insecurity (09/13/2022)   Hunger Vital Sign    Worried About Running Out of Food in the Last Year: Never true    Ran Out of Food in the Last Year: Never true  Transportation Needs: Unmet Transportation Needs (09/29/2022)   PRAPARE -  Administrator, Civil Service (Medical): Yes    Lack of Transportation (Non-Medical): Yes  Physical Activity: Not on file  Stress: Not on file  Social Connections: Not on file  Intimate Partner Violence: Not At Risk (09/13/2022)   Humiliation, Afraid, Rape, and Kick questionnaire    Fear of Current or Ex-Partner: No    Emotionally Abused: No    Physically Abused: No    Sexually Abused: No     Review of Systems    General:  No chills, fever, night sweats or weight changes.  Cardiovascular:  No chest pain, dyspnea on exertion, bilateral lower extremity woody edema, orthopnea, palpitations, paroxysmal nocturnal dyspnea. Dermatological: No rash, lesions/masses Respiratory: No cough, dyspnea Urologic: No hematuria, dysuria Abdominal:   No nausea, vomiting, diarrhea, bright red blood per rectum, melena, or hematemesis Neurologic:  No visual changes, wkns, changes in mental status. All other systems reviewed and are otherwise negative except as noted above.  Physical Exam    VS:  There were no vitals taken for this visit. , BMI There is no height or weight on file to calculate BMI. GEN: Well nourished, well developed, in no acute distress. HEENT: normal. Neck: Supple, no JVD, carotid bruits, or masses. Cardiac: RRR, no murmurs, rubs, or gallops. No clubbing, cyanosis, edema.  Radials/DP/PT 2+ and  equal bilaterally.  Respiratory:  Respirations regular and unlabored, clear to auscultation bilaterally. GI: Soft, nontender, nondistended, BS + x 4. MS: no deformity or atrophy. Skin: warm and dry, no rash. Neuro:  Strength and sensation are intact. Psych: Normal affect.  Accessory Clinical Findings    Recent Labs: 09/12/2022: B Natriuretic Peptide 427.0 09/13/2022: ALT 47; Magnesium 2.3 09/16/2022: Hemoglobin 12.3; Platelets 195 09/30/2022: BUN 8; Creatinine, Ser 0.92; Potassium 4.7; Sodium 146   Recent Lipid Panel    Component Value Date/Time   CHOL 165 05/29/2019 0953   TRIG 122 09/13/2022 0131   HDL 44 05/29/2019 0953   CHOLHDL 3.8 05/29/2019 0953   LDLCALC 98 05/29/2019 0953    ECG personally reviewed by me today-***none today.  Echocardiogram 04/03/2021  IMPRESSIONS     1. Left ventricular ejection fraction, by estimation, is 55 to 60%. The  left ventricle has normal function. The left ventricle has no regional  wall motion abnormalities. Left ventricular diastolic parameters were  normal.   2. Right ventricular systolic function was not well visualized. The right  ventricular size is not well visualized. There is normal pulmonary artery  systolic pressure.   3. The mitral valve is normal in structure. No evidence of mitral valve  regurgitation. No evidence of mitral stenosis.   4. The aortic valve was not well visualized. Aortic valve regurgitation  is not visualized. No aortic stenosis is present.   5. Aortic dilatation noted. There is mild dilatation of the aortic root,  measuring 40 mm.   6. No complete TR doppler jet so unable to estimate PA systolic pressure.   FINDINGS   Left Ventricle: Left ventricular ejection fraction, by estimation, is 55  to 60%. The left ventricle has normal function. The left ventricle has no  regional wall motion abnormalities. The left ventricular internal cavity  size was normal in size. There is   no left ventricular  hypertrophy. Left ventricular diastolic parameters  were normal.   Right Ventricle: The right ventricular size is not well visualized. Right  vetricular wall thickness was not well visualized. Right ventricular  systolic function was not well visualized. There is normal pulmonary  artery systolic pressure. The tricuspid  regurgitant velocity is 2.58 m/s, and with an assumed right atrial  pressure of 3 mmHg, the estimated right ventricular systolic pressure is  29.6 mmHg.   Left Atrium: Left atrial size was normal in size.   Right Atrium: Right atrial size was not well visualized.   Pericardium: Trivial pericardial effusion is present.   Mitral Valve: The mitral valve is normal in structure. Mild mitral annular  calcification. No evidence of mitral valve regurgitation. No evidence of  mitral valve stenosis.   Tricuspid Valve: The tricuspid valve is normal in structure. Tricuspid  valve regurgitation is trivial.   Aortic Valve: The aortic valve was not well visualized. Aortic valve  regurgitation is not visualized. No aortic stenosis is present. Aortic  valve mean gradient measures 8.0 mmHg. Aortic valve peak gradient measures  13.1 mmHg. Aortic valve area, by VTI  measures 4.97 cm.   Pulmonic Valve: The pulmonic valve was not well visualized. Pulmonic valve  regurgitation is not visualized.   Aorta: Aortic dilatation noted. There is mild dilatation of the aortic  root, measuring 40 mm.   Echocardiogram 09/13/2022  IMPRESSIONS     1. Left ventricular ejection fraction, by estimation, is 60 to 65%. The  left ventricle has normal function. The left ventricle has no regional  wall motion abnormalities. There is mild left ventricular hypertrophy.  Left ventricular diastolic parameters  are consistent with Grade II diastolic dysfunction (pseudonormalization).   2. Right ventricular systolic function is normal. The right ventricular  size is normal.   3. The mitral valve is  normal in structure. No evidence of mitral valve  regurgitation. No evidence of mitral stenosis.   4. The aortic valve is tricuspid. Aortic valve regurgitation is not  visualized. No aortic stenosis is present.   5. The inferior vena cava is normal in size with greater than 50%  respiratory variability, suggesting right atrial pressure of 3 mmHg.   FINDINGS   Left Ventricle: Left ventricular ejection fraction, by estimation, is 60  to 65%. The left ventricle has normal function. The left ventricle has no  regional wall motion abnormalities. Definity contrast agent was given IV  to delineate the left ventricular   endocardial borders. The left ventricular internal cavity size was normal  in size. There is mild left ventricular hypertrophy. Left ventricular  diastolic parameters are consistent with Grade II diastolic dysfunction  (pseudonormalization).   Right Ventricle: The right ventricular size is normal. No increase in  right ventricular wall thickness. Right ventricular systolic function is  normal.   Left Atrium: Left atrial size was normal in size.   Right Atrium: Right atrial size was normal in size.   Pericardium: There is no evidence of pericardial effusion.   Mitral Valve: The mitral valve is normal in structure. No evidence of  mitral valve regurgitation. No evidence of mitral valve stenosis.   Tricuspid Valve: The tricuspid valve is normal in structure. Tricuspid  valve regurgitation is not demonstrated. No evidence of tricuspid  stenosis.   Aortic Valve: The aortic valve is tricuspid. Aortic valve regurgitation is  not visualized. No aortic stenosis is present.   Pulmonic Valve: The pulmonic valve was normal in structure. Pulmonic valve  regurgitation is not visualized. No evidence of pulmonic stenosis.   Aorta: The aortic root is normal in size and structure.   Venous: The inferior vena cava is normal in size with greater than 50%  respiratory variability,  suggesting right atrial pressure of  3 mmHg.   IAS/Shunts: The interatrial septum was not well visualized.    Assessment & Plan   1. Essential hypertension-BP [id number].  Maintain blood pressure log Continue carvedilol, losartan Heart healthy low-sodium diet-salty 6 reviewed Increase physical activity as tolerated   Diastolic CHF-denies increased DOE or activity intolerance.  Weight today***400 pounds.  Euvolemic .  Bilateral lower extremity woody edema. Continue current medical therapy Heart healthy low-sodium diet Increase physical activity as tolerated Follows with advanced heart failure clinic   Obstructive sleep apnea-compliant with CPAP.  Continues to wake up well rested continue CPAP use. Continue weight loss Avoid sleep on sleeping Sleep hygiene  Aortic root dilation-denies episodes of back discomfort.  Echocardiogram 04/03/2021 showed aortic root measuring 40 mm.  Follow-up echocardiogram 09/13/2022 showed normal LVEF, G2 DD and no aortic root dilation. Continue to monitor  Morbid obesity-weight today***400.8 .  Following strict diet. Continue weight loss and exercise Follows with PCP  Follow-up with Dr. Antoine Poche or me in 6-9 months.   Thomasene Ripple. Shondale Quinley NP-C     06/01/2023, 11:28 AM [facility] [address] Office [phone] Fax [phone]  Notice: This dictation was prepared with Dragon dictation along with smaller phrase technology. Any transcriptional errors that result from this process are unintentional and may not be corrected upon review.  I spent 13*** minutes examining this patient, reviewing medications, and using patient centered shared decision making involving her cardiac care.  Prior to her visit I spent greater than 20 minutes reviewing her past medical history,  medications, and prior cardiac tests.

## 2023-06-03 ENCOUNTER — Encounter: Payer: Self-pay | Admitting: General Practice

## 2023-06-03 ENCOUNTER — Ambulatory Visit: Payer: Medicaid Other | Attending: General Practice | Admitting: General Practice

## 2023-09-06 NOTE — Progress Notes (Unsigned)
Cardiology Clinic Note   Patient Name: Larin Brallier Date of Encounter: 09/07/2023  Primary Care Provider:  Georganna Skeans, MD Primary Cardiologist:  Rollene Rotunda, MD  Patient Profile    Haydan Kapla 57 year old male presents the clinic today for follow-up evaluation of his acute on chronic diastolic CHF and essential hypertension.  Past Medical History    Past Medical History:  Diagnosis Date   Acute diastolic HF (heart failure) (HCC) 02/08/2019   Acute respiratory failure with hypoxia (HCC) 02/08/2019   CHF (congestive heart failure) (HCC)    COPD (chronic obstructive pulmonary disease) (HCC)    HTN (hypertension)    Morbid obesity (HCC)    Past Surgical History:  Procedure Laterality Date   C-spine surgery      Allergies  Allergies  Allergen Reactions   Metformin And Related Diarrhea    History of Present Illness    Quantarius Holloran has a PMH of HTN, chronic diastolic CHF, acute COPD exacerbation, type 2 diabetes, AKI, morbid obesity, anemia, morbid obesity and shortness of breath.  He was seen by Dr. Antoine Poche on 03/11/2021.  He complained of lower extremity edema.  Dr. Antoine Poche found him to be euvolemic with no evidence of significant fluid volume overload.  His blood pressure was not optimal and his carvedilol was increased to 25 mg twice daily.  He was seen by his PCP 10/30/2021 and his blood pressure was 121 over 70s.  He was seen in follow-up by Bailey Mech, DNP on 12/19/2021.  During that time he had no cardiac complaints.  He had not yet taken his medications that day.  His blood pressure was elevated.  He did note occasional cough with some phlegm.  He denied fever chills and significant chest congestion.  He was doing well with weight loss and lost around 28 pounds.  He was trying to eliminate soft drinks.  He was working on more strict low-sodium diet and increasing physical activity.  He reported that his sister is a Publishing rights manager at [employer]  and had been coaching him to become more active.  He presented to the clinic 07/06/22 for follow-up evaluation stated he felt fairly well.  He presented to the clinic today with 3 L of oxygen nasal cannula.  He reported that he was ordered to start this due to his respiratory status.  He was saturating in the 95% range.  He did not need to increase his oxygen with increased physical activity.  He reported that he was disappointed due to his weight increase back to 400 pounds.  He was working on reducing his soft drinks.   We reviewed his previous echocardiogram he expressed understanding.  I orderd a CBC, BMP, repeat echocardiogram and plan follow-up in 6 months.  He was seen in follow-up by Tonye Becket, NP on 10/27/2022.  He was tolerating transition from losartan to [facility].  His spironolactone, Farxiga, torsemide, carvedilol were continued.  His volume status was stable.  He had been admitted for hypercarbic respiratory failure due to poor compliance with CPAP.  It was recommended that he continue his CPAP nightly.  He presents to the clinic today for follow-up evaluation and states he brings paperwork for Dr. Antoine Poche to fill out from his insurance company.  We reviewed his last general cardiology appointment and heart failure appointments.  He expressed understanding.  He continues to be compliant with his CPAP.  He has been monitoring his diet.  He continues to lose weight.  His weight today is  357 pounds.  He continues on 3 L of oxygen.  Today he denies chest pain, increased shortness of breath, lower extremity edema, fatigue, palpitations, melena, hematuria, hemoptysis, diaphoresis, weakness, presyncope, syncope, orthopnea, and PND.   Home Medications    Prior to Admission medications   Medication Sig Start Date End Date Taking? Authorizing Provider  Accu-Chek Softclix Lancets lancets Use to check blood sugar once daily. 03/30/22   Hoy Register, MD  aspirin 81 MG EC tablet Take 1 tablet (81 mg  total) by mouth daily. 12/12/19   Anders Simmonds, PA-C  atorvastatin (LIPITOR) 10 MG tablet Take 1 tablet (10 mg total) by mouth daily. 03/30/22   Hoy Register, MD  Blood Glucose Monitoring Suppl (ACCU-CHEK GUIDE) w/Device KIT Use to check blood sugar once daily. 03/30/22   Hoy Register, MD  carvedilol (COREG) 25 MG tablet Take 1 tablet (25 mg total) by mouth 2 (two) times daily with a meal. 03/30/22   Hoy Register, MD  dapagliflozin propanediol (FARXIGA) 10 MG TABS tablet Take 1 tablet (10 mg total) by mouth daily before breakfast. 03/30/22   Hoy Register, MD  glucose blood (ACCU-CHEK GUIDE) test strip Use to check blood sugar once daily. 03/30/22   Hoy Register, MD  iron polysaccharides (NIFEREX) 150 MG capsule Take 150 mg by mouth daily. 07/18/19   [provider]  Lancet Devices (ONE TOUCH DELICA LANCING DEV) MISC Use as directed. Dx: E10.9, E11.9 10/17/19   Marcine Matar, MD  losartan (COZAAR) 25 MG tablet TAKE 1 TABLET(25 MG) BY MOUTH DAILY 03/30/22   Hoy Register, MD  Melatonin 10 MG TABS Take 1 tablet by mouth at bedtime as needed (sleep).    [provider]  sitaGLIPtin (JANUVIA) 25 MG tablet Take 1 tablet (25 mg total) by mouth daily. 04/29/21   Rema Fendt, NP  torsemide (DEMADEX) 20 MG tablet Take 2 tablets (40 mg total) by mouth daily. 03/30/22   Hoy Register, MD  vitamin B-12 1000 MCG tablet Take 1 tablet (1,000 mcg total) by mouth daily. 04/05/21   Ghimire, Werner Lean, MD    Family History    Family History  Problem Relation Age of Onset   Cancer Mother        Patient is not sure which type of cancer   Leukemia Father    He indicated that his mother is alive. He indicated that his father is deceased.  Social History    Social History   Socioeconomic History   Marital status: Single    Spouse name: Not on file   Number of children: Not on file   Years of education: Not on file   Highest education level: Not on file  Occupational  History   Not on file  Tobacco Use   Smoking status: Never   Smokeless tobacco: Never  Vaping Use   Vaping status: Never Used  Substance and Sexual Activity   Alcohol use: Never   Drug use: Never   Sexual activity: Not on file  Other Topics Concern   Not on file  Social History Narrative   Lives with cousin for now.  Works at [employer].     Social Determinants of Health   Financial Resource Strain: Low Risk  (09/17/2022)   Overall Financial Resource Strain (CARDIA)    Difficulty of Paying Living Expenses: Not very hard  Food Insecurity: No Food Insecurity (09/13/2022)   Hunger Vital Sign    Worried About Running Out of Food in the Last  Year: Never true    Ran Out of Food in the Last Year: Never true  Transportation Needs: Unmet Transportation Needs (09/29/2022)   PRAPARE - Administrator, Civil Service (Medical): Yes    Lack of Transportation (Non-Medical): Yes  Physical Activity: Not on file  Stress: Not on file  Social Connections: Not on file  Intimate Partner Violence: Not At Risk (09/13/2022)   Humiliation, Afraid, Rape, and Kick questionnaire    Fear of Current or Ex-Partner: No    Emotionally Abused: No    Physically Abused: No    Sexually Abused: No     Review of Systems    General:  No chills, fever, night sweats or weight changes.  Cardiovascular:  No chest pain, dyspnea on exertion, bilateral lower extremity woody edema, orthopnea, palpitations, paroxysmal nocturnal dyspnea. Dermatological: No rash, lesions/masses Respiratory: No cough, dyspnea Urologic: No hematuria, dysuria Abdominal:   No nausea, vomiting, diarrhea, bright red blood per rectum, melena, or hematemesis Neurologic:  No visual changes, wkns, changes in mental status. All other systems reviewed and are otherwise negative except as noted above.  Physical Exam    VS:  BP (!) 140/90 (BP Location: Left Arm, Patient Position: Sitting, Cuff Size: Large)   Ht 5\' 11"  (1.803 m)   Wt (!) 357 lb  (161.9 kg)   BMI 49.79 kg/m  , BMI Body mass index is 49.79 kg/m. GEN: Well nourished, well developed, in no acute distress. HEENT: normal. Neck: Supple, no JVD, carotid bruits, or masses. Cardiac: RRR, no murmurs, rubs, or gallops. No clubbing, cyanosis, edema.  Radials/DP/PT 2+ and equal bilaterally.  Respiratory:  Respirations regular and unlabored, clear to auscultation bilaterally. GI: Soft, nontender, nondistended, BS + x 4. MS: no deformity or atrophy. Skin: warm and dry, no rash. Neuro:  Strength and sensation are intact. Psych: Normal affect.  Accessory Clinical Findings    Recent Labs: 09/12/2022: B Natriuretic Peptide 427.0 09/13/2022: ALT 47; Magnesium 2.3 09/16/2022: Hemoglobin 12.3; Platelets 195 09/30/2022: BUN 8; Creatinine, Ser 0.92; Potassium 4.7; Sodium 146   Recent Lipid Panel    Component Value Date/Time   CHOL 165 05/29/2019 0953   TRIG 122 09/13/2022 0131   HDL 44 05/29/2019 0953   CHOLHDL 3.8 05/29/2019 0953   LDLCALC 98 05/29/2019 0953    ECG personally reviewed by me today-none today.  Echocardiogram 04/03/2021  IMPRESSIONS     1. Left ventricular ejection fraction, by estimation, is 55 to 60%. The  left ventricle has normal function. The left ventricle has no regional  wall motion abnormalities. Left ventricular diastolic parameters were  normal.   2. Right ventricular systolic function was not well visualized. The right  ventricular size is not well visualized. There is normal pulmonary artery  systolic pressure.   3. The mitral valve is normal in structure. No evidence of mitral valve  regurgitation. No evidence of mitral stenosis.   4. The aortic valve was not well visualized. Aortic valve regurgitation  is not visualized. No aortic stenosis is present.   5. Aortic dilatation noted. There is mild dilatation of the aortic root,  measuring 40 mm.   6. No complete TR doppler jet so unable to estimate PA systolic pressure.   FINDINGS    Left Ventricle: Left ventricular ejection fraction, by estimation, is 55  to 60%. The left ventricle has normal function. The left ventricle has no  regional wall motion abnormalities. The left ventricular internal cavity  size was normal in size. There  is   no left ventricular hypertrophy. Left ventricular diastolic parameters  were normal.   Right Ventricle: The right ventricular size is not well visualized. Right  vetricular wall thickness was not well visualized. Right ventricular  systolic function was not well visualized. There is normal pulmonary  artery systolic pressure. The tricuspid  regurgitant velocity is 2.58 m/s, and with an assumed right atrial  pressure of 3 mmHg, the estimated right ventricular systolic pressure is  29.6 mmHg.   Left Atrium: Left atrial size was normal in size.   Right Atrium: Right atrial size was not well visualized.   Pericardium: Trivial pericardial effusion is present.   Mitral Valve: The mitral valve is normal in structure. Mild mitral annular  calcification. No evidence of mitral valve regurgitation. No evidence of  mitral valve stenosis.   Tricuspid Valve: The tricuspid valve is normal in structure. Tricuspid  valve regurgitation is trivial.   Aortic Valve: The aortic valve was not well visualized. Aortic valve  regurgitation is not visualized. No aortic stenosis is present. Aortic  valve mean gradient measures 8.0 mmHg. Aortic valve peak gradient measures  13.1 mmHg. Aortic valve area, by VTI  measures 4.97 cm.   Pulmonic Valve: The pulmonic valve was not well visualized. Pulmonic valve  regurgitation is not visualized.   Aorta: Aortic dilatation noted. There is mild dilatation of the aortic  root, measuring 40 mm.   Echocardiogram 09/13/2022  IMPRESSIONS     1. Left ventricular ejection fraction, by estimation, is 60 to 65%. The  left ventricle has normal function. The left ventricle has no regional  wall motion  abnormalities. There is mild left ventricular hypertrophy.  Left ventricular diastolic parameters  are consistent with Grade II diastolic dysfunction (pseudonormalization).   2. Right ventricular systolic function is normal. The right ventricular  size is normal.   3. The mitral valve is normal in structure. No evidence of mitral valve  regurgitation. No evidence of mitral stenosis.   4. The aortic valve is tricuspid. Aortic valve regurgitation is not  visualized. No aortic stenosis is present.   5. The inferior vena cava is normal in size with greater than 50%  respiratory variability, suggesting right atrial pressure of 3 mmHg.   FINDINGS   Left Ventricle: Left ventricular ejection fraction, by estimation, is 60  to 65%. The left ventricle has normal function. The left ventricle has no  regional wall motion abnormalities. Definity contrast agent was given IV  to delineate the left ventricular   endocardial borders. The left ventricular internal cavity size was normal  in size. There is mild left ventricular hypertrophy. Left ventricular  diastolic parameters are consistent with Grade II diastolic dysfunction  (pseudonormalization).   Right Ventricle: The right ventricular size is normal. No increase in  right ventricular wall thickness. Right ventricular systolic function is  normal.   Left Atrium: Left atrial size was normal in size.   Right Atrium: Right atrial size was normal in size.   Pericardium: There is no evidence of pericardial effusion.   Mitral Valve: The mitral valve is normal in structure. No evidence of  mitral valve regurgitation. No evidence of mitral valve stenosis.   Tricuspid Valve: The tricuspid valve is normal in structure. Tricuspid  valve regurgitation is not demonstrated. No evidence of tricuspid  stenosis.   Aortic Valve: The aortic valve is tricuspid. Aortic valve regurgitation is  not visualized. No aortic stenosis is present.   Pulmonic Valve:  The pulmonic valve was normal  in structure. Pulmonic valve  regurgitation is not visualized. No evidence of pulmonic stenosis.   Aorta: The aortic root is normal in size and structure.   Venous: The inferior vena cava is normal in size with greater than 50%  respiratory variability, suggesting right atrial pressure of 3 mmHg.   IAS/Shunts: The interatrial septum was not well visualized.    Assessment & Plan   1.  Diastolic CHF-breathing stable.  NYHA class III.  Weight today 357 pounds.  Euvolemic .  Bilateral lower extremity woody edema. Continue current medical therapy Heart healthy low-sodium diet-salty 6 reviewed Increase physical activity as tolerated Order Echo  Essential hypertension-BP today 132/86.   Maintain blood pressure log . Continue carvedilol, losartan Heart healthy low-sodium diet Increase physical activity as tolerated  Obstructive sleep apnea-compliant with CPAP.  Noted to have prior admissions for hypercarbia due to poor compliance with CPAP. Continue CPAP use. Continue weight loss Avoid sedative and EtOH before bed-reviewed   Aortic root dilation-denies episodes of back discomfort.  Echocardiogram 04/03/2021 showed aortic root measuring 40 mm.  Echocardiogram 9/23 showed normal aortic root. Repeat echocardiogram  Morbid obesity-weight today 357 lbs .   Disappointed in weight gain. Continue weight loss Continue heart healthy low-sodium diet   Follow-up with Dr. Antoine Poche or me in 6-9 months.   Thomasene Ripple. Lashauna Arpin NP-C     09/07/2023, 2:56 PM [facility] [address] Office [phone] Fax [phone]  Notice: This dictation was prepared with Dragon dictation along with smaller phrase technology. Any transcriptional errors that result from this process are unintentional and may not be corrected upon review.  I spent 14 minutes examining this patient, reviewing medications, and using patient centered shared  decision making involving her cardiac care.  Prior to her visit I spent greater than 20 minutes reviewing her past medical history,  medications, and prior cardiac tests.

## 2023-09-07 ENCOUNTER — Encounter: Payer: Self-pay | Admitting: General Practice

## 2023-09-07 ENCOUNTER — Ambulatory Visit: Payer: 59 | Attending: Cardiology | Admitting: General Practice

## 2023-09-07 ENCOUNTER — Other Ambulatory Visit (HOSPITAL_COMMUNITY): Payer: Self-pay

## 2023-09-07 VITALS — BP 132/86 | Ht 71.0 in | Wt 357.0 lb

## 2023-09-07 DIAGNOSIS — G4733 Obstructive sleep apnea (adult) (pediatric): Secondary | ICD-10-CM

## 2023-09-07 DIAGNOSIS — I5032 Chronic diastolic (congestive) heart failure: Secondary | ICD-10-CM | POA: Diagnosis not present

## 2023-09-07 DIAGNOSIS — I10 Essential (primary) hypertension: Secondary | ICD-10-CM | POA: Diagnosis not present

## 2023-09-07 DIAGNOSIS — I77810 Thoracic aortic ectasia: Secondary | ICD-10-CM | POA: Diagnosis not present

## 2023-09-07 NOTE — Patient Instructions (Signed)
Medication Instructions:  The current medical regimen is effective;  continue present plan and medications as directed. Please refer to the Current Medication list given to you today.  *If you need a refill on your cardiac medications before your next appointment, please call your pharmacy*  Lab Work: NONE If you have labs (blood work) drawn today and your tests are completely normal, you will receive your results only by:  MyChart Message (if you have MyChart) OR  A paper copy in the mail If you have any lab test that is abnormal or we need to change your treatment, we will call you to review the results.  Testing/Procedures: Your physician has requested that you have an echocardiogram. Echocardiography is a painless test that uses sound waves to create images of your heart. It provides your doctor with information about the size and shape of your heart and how well your heart's chambers and valves are working. This procedure takes approximately one hour. There are no restrictions for this procedure. Please do NOT wear cologne, perfume, aftershave, or lotions (deodorant is allowed). Please arrive 15 minutes prior to your appointment time.   Other Instructions TAKE AND LOG YOUR WEIGHT DAILY-AFTER 1ST URINATION, BEFORE EATING  Follow-Up: At [facility], you and your health needs are our priority.  As part of our continuing mission to provide you with exceptional heart care, we have created designated Provider Care Teams.  These Care Teams include your primary Cardiologist (physician) and Advanced Practice Providers (APPs -  Physician Assistants and Nurse Practitioners) who all work together to provide you with the care you need, when you need it.  We recommend signing up for the patient portal called "MyChart".  Sign up information is provided on this After Visit Summary.  MyChart is used to connect with patients for Virtual Visits (Telemedicine).  Patients are able to view lab/test  results, encounter notes, upcoming appointments, etc.  Non-urgent messages can be sent to your provider as well.   To learn more about what you can do with MyChart, go to [email or web address].    Your next appointment:   6-9 month(s)  Provider:   Rollene Rotunda, MD

## 2023-09-13 ENCOUNTER — Encounter: Payer: Self-pay | Admitting: *Deleted

## 2023-09-13 NOTE — Telephone Encounter (Signed)
Received disability paperwork for this patient that dr hochrein has not seen since 2022. This encounter was created in error - please disregard.

## 2023-09-15 ENCOUNTER — Telehealth: Payer: Self-pay | Admitting: Cardiology

## 2023-09-15 NOTE — Telephone Encounter (Signed)
Pt is requesting a callback regarding him checking on the status of the disability paperwork he left at that office to be filled out due to him needing to have them turned in before 10/1. Please advise

## 2023-09-15 NOTE — Telephone Encounter (Signed)
Patient called to see if his disability paperwork is complete. He stated he left it to be filled out during his appointment on 9/17. He states it needs to be completed and turned in by 10/1.

## 2023-09-15 NOTE — Telephone Encounter (Signed)
Spoke with pt, aware for dr hochrein to fill out the disability paperwork he will need to be seen. Follow up scheduled as soon as possible.

## 2023-09-27 ENCOUNTER — Ambulatory Visit (HOSPITAL_COMMUNITY): Payer: 59 | Attending: General Practice

## 2023-09-27 ENCOUNTER — Ambulatory Visit: Payer: Medicaid Other | Admitting: Cardiology

## 2023-09-27 DIAGNOSIS — I5032 Chronic diastolic (congestive) heart failure: Secondary | ICD-10-CM

## 2023-09-27 LAB — ECHOCARDIOGRAM COMPLETE
Area-P 1/2: 4.01 cm2
S' Lateral: 2.8 cm

## 2023-09-27 MED ORDER — PERFLUTREN LIPID MICROSPHERE
1.0000 mL | INTRAVENOUS | Status: AC | PRN
  Administered 2023-09-27: 2 mL via INTRAVENOUS

## 2023-09-30 NOTE — Progress Notes (Signed)
Cardiology Office Note:   Date:  10/01/2023  ID:  Edwin Mcclain, DOB 07-15-1966, MRN [id number] PCP: Georganna Skeans, MD  [facility] Providers Cardiologist:  Rollene Rotunda, MD {  History of Present Illness:   Edwin Mcclain is a 57 y.o. male for evaluation of chronic diastolic HF.  I have not seen him in a couple of years.  He is coming with some disability paperwork.  He has been managed for COPD, type 2 diabetes, renal insufficiency, morbid obesity and chronic shortness of breath.  He had chronic lower extremity swelling.  He has been on home O2 in the past.  [facility]  He was in the hospital last fall with acute on chronic respiratory failure.  He had BiPAP but he not been compliant with it.  He has been told he had COPD and was actually managed with prednisone and antibiotics at that time.  He was living in a boardinghouse but now lives in [place] and a home by himself close to family.  He has been on chronic disability.  He is using 3 L of oxygen.  He reports that when he uses his oxygen he feels well.  We did walk him around the office today and his sats dropped into the 80s off of oxygen when we walked him around.  Came back when resting.  He says he is been watching his daily weights and they have been stable.  He has chronic lower extremity swelling.  He is not having any new PND or orthopnea.  He sleeps with his head up a little bit.  He says he watches his salt and fluid and that he is compliant with his CPAP and he is medications.  ROS: As stated in the HPI and negative for all other systems.  Studies Reviewed:    EKG:   NA  Risk Assessment/Calculations:    Physical Exam:   VS:  BP (!) 144/80 (BP Location: Left Arm, Patient Position: Sitting, Cuff Size: Large)   Pulse 86   Ht 5\' 11"  (1.803 m)   Wt (!) 358 lb (162.4 kg)   SpO2 95%   BMI 49.93 kg/m    Wt Readings from Last 3 Encounters:  10/01/23 (!) 358 lb (162.4 kg)   09/07/23 (!) 357 lb (161.9 kg)  01/28/23 (!) 394 lb 12.8 oz (179.1 kg)     GEN: Well nourished, well developed in no acute distress NECK: No JVD; No carotid bruits CARDIAC: RRR, no murmurs, rubs, gallops RESPIRATORY:  Clear to auscultation without rales, wheezing or rhonchi  ABDOMEN: Soft, non-tender, non-distended EXTREMITIES: Moderate bilateral lower extremity edema; No deformity.  Chronic venous stasis changes  ASSESSMENT AND PLAN:   Diastolic CHF: Would like to increase his Entresto.  I also do not see that he is ever had a workup for his diastolic dysfunction so I will get a PYP scan.   Essential hypertension: This will be managed in context of treating his heart failure.  Obstructive sleep apnea: He says he is compliant with his CPAP.   Aortic root dilation: This is mention on an echo in 2022 but I do not see evidence of this on the most recent CT in September 2023.  We will follow this up with echocardiography in the future.   Morbid obesity: He has been counseled multiple times about weight loss with diet.     Follow up with APP in six months.   Signed, Rollene Rotunda, MD

## 2023-10-01 ENCOUNTER — Telehealth: Payer: Self-pay | Admitting: Cardiology

## 2023-10-01 ENCOUNTER — Encounter: Payer: Self-pay | Admitting: Cardiology

## 2023-10-01 ENCOUNTER — Ambulatory Visit: Payer: 59 | Attending: Cardiology | Admitting: Cardiology

## 2023-10-01 VITALS — BP 144/80 | HR 86 | Ht 71.0 in | Wt 358.0 lb

## 2023-10-01 DIAGNOSIS — I5032 Chronic diastolic (congestive) heart failure: Secondary | ICD-10-CM

## 2023-10-01 NOTE — Telephone Encounter (Signed)
The requested disability forms faxed to [employer] (questionnaire completed by Dr. Antoine Poche).  Called pt to inform him that 2 additional forms are needed from him to fill out in the office + $29 processing fee (cash, check, or money order).  Forms to complete (Release of Info. & Billing) left in FMLA box at front desk.  JB, 10-01-23

## 2023-10-01 NOTE — Patient Instructions (Signed)
   Follow-Up: At [facility], you and your health needs are our priority.  As part of our continuing mission to provide you with exceptional heart care, we have created designated Provider Care Teams.  These Care Teams include your primary Cardiologist (physician) and Advanced Practice Providers (APPs -  Physician Assistants and Nurse Practitioners) who all work together to provide you with the care you need, when you need it.  We recommend signing up for the patient portal called "MyChart".  Sign up information is provided on this After Visit Summary.  MyChart is used to connect with patients for Virtual Visits (Telemedicine).  Patients are able to view lab/test results, encounter notes, upcoming appointments, etc.  Non-urgent messages can be sent to your provider as well.   To learn more about what you can do with MyChart, go to [email or web address].    Your next appointment:   6 month(s)  Provider:   Edd Fabian NP

## 2023-10-04 DIAGNOSIS — Z0279 Encounter for issue of other medical certificate: Secondary | ICD-10-CM

## 2023-10-04 NOTE — Telephone Encounter (Signed)
Billing forms faxed.  10-04-23, JB

## 2023-10-04 NOTE — Telephone Encounter (Signed)
Pt came into office to fill out 2 additional forms needed for FMLA processing & paid $29 fee.   Pt states he needed more info completed by Dr. Antoine Poche for [employer]. [employer] paperwork left in Dr. Jenene Slicker box,. Pt is informed that Dr. Antoine Poche will not be back in office until next week (week of 10-11-23).  Pt asked if info previously completed by Dr. Antoine Poche (for [facility]) could be used to fax to [employer] - states his benefits are being effected due to [facility] not having all needed information. I informed him that I would consult with my supervisor.  JB, 10-04-23

## 2023-10-05 NOTE — Telephone Encounter (Signed)
Spoke w/pt to inform him that the information completed for [employer] could not be used for [employer], per N.Cocklin. Pt clarified that info for [employer] is Workman's Comp/Long-term Disability.   Pt asked if prior Notes from his last doctor's appt w/Dr. Antoine Poche (10-01-23) could be sent to [facility]?? Pt is concerned about benefits.   I will speak w/ my supervisor & Dr. Jenene Slicker nurse to clarify.   JB, 10-05-23

## 2023-10-11 NOTE — Telephone Encounter (Signed)
$  29 FMLA processing fee payment posted to pt's account.  JB, 10-11-23

## 2023-10-13 NOTE — Telephone Encounter (Signed)
Pt following up to see if [employer] received documents. Informed him there is a note that forms were sent yesterday. Recommended he call [employer] to see if forms have been received. Pt verbalized understanding, no further questions at this time.

## 2023-10-13 NOTE — Telephone Encounter (Signed)
Pt calling to see if we received fax for disability. Wanting update. Please call to advise//ah

## 2023-10-14 ENCOUNTER — Telehealth: Payer: Self-pay | Admitting: Cardiology

## 2023-10-14 NOTE — Telephone Encounter (Signed)
[employer] Disability forms faxed & scanned into pt's chart.  JB, 10-14-23

## 2023-10-14 NOTE — Telephone Encounter (Signed)
Patient called to say the [employer] havent received the documents. Please refax. Please advise

## 2023-10-15 NOTE — Telephone Encounter (Signed)
Spoke w/pt yesterday afternoon to inform him that completed paperwork was being faxed to [facility]. Will contact pt if further information is needed.  JB, 10-15-23

## 2024-02-01 ENCOUNTER — Telehealth: Payer: Self-pay | Admitting: Family Medicine

## 2024-02-01 NOTE — Telephone Encounter (Signed)
Patient was identified as falling into the [employer] Measure - Diabetes.   Patient was: Left voicemail to schedule with primary care provider.

## 2024-03-07 ENCOUNTER — Ambulatory Visit: Payer: 59 | Admitting: Cardiology

## 2024-03-27 ENCOUNTER — Telehealth: Payer: Self-pay

## 2024-03-27 NOTE — Telephone Encounter (Signed)
 I called patient and no one answered so I left a voice message to return my call.

## 2024-03-27 NOTE — Telephone Encounter (Signed)
 Copied from CRM [id number]. Topic: Clinical - Medication Question >> Mar 27, 2024 10:41 AM Sim Boast F wrote: Reason for CRM: Patient request a call back today to go over his medication list, patient is not tech savvy and does not have MyChart. Please call [phone] (M)

## 2024-03-27 NOTE — Telephone Encounter (Signed)
 I called patient and gave him a list of his medications.

## 2024-05-11 NOTE — Progress Notes (Deleted)
 Cardiology Office Note:   Date:  05/11/2024  ID:  Edwin Mcclain, DOB 14-May-1966, MRN [id number] PCP: Abraham Abo, MD  [facility] Providers Cardiologist:  Eilleen Grates, MD {  History of Present Illness:   Edwin Mcclain is a 58 y.o. male  for evaluation of chronic diastolic HF.  I have not seen him in a couple of years.  He is coming with some disability paperwork.  He has been managed for COPD, type 2 diabetes, renal insufficiency, morbid obesity and chronic shortness of breath.  He had chronic lower extremity swelling.  He has been on home O2 in the past.  [facility].  He was in the hospital last fall with acute on chronic respiratory failure.  He had BiPAP but he not been compliant with it.  He has been told he had COPD and was actually managed with prednisone  and antibiotics at that time.  He was living in a boardinghouse but now lives in [place] and a home by himself close to family.  He has been on chronic disability.  He is using 3 L of oxygen .  ***   ***  He reports that when he uses his oxygen  he feels well.  We did walk him around the office today and his sats dropped into the 80s off of oxygen  when we walked him around.  Came back when resting.  He says he is been watching his daily weights and they have been stable.  He has chronic lower extremity swelling.  He is not having any new PND or orthopnea.  He sleeps with his head up a little bit.  He says he watches his salt and fluid and that he is compliant with his CPAP and he is medications.    ROS: ***  Studies Reviewed:    EKG:       ***  Risk Assessment/Calculations:   {Does this patient have ATRIAL FIBRILLATION?[phone]} No BP recorded.  {Refresh Note OR Click here to enter BP  :1}***        Physical Exam:   VS:  There were no vitals taken for this visit.   Wt Readings from Last 3 Encounters:  10/01/23 (!) 358 lb (162.4 kg)  09/07/23 (!) 357 lb (161.9 kg)   01/28/23 (!) 394 lb 12.8 oz (179.1 kg)     GEN: Well nourished, well developed in no acute distress NECK: No JVD; No carotid bruits CARDIAC: ***RR, *** murmurs, rubs, gallops RESPIRATORY:  Clear to auscultation without rales, wheezing or rhonchi  ABDOMEN: Soft, non-tender, non-distended EXTREMITIES:  No edema; No deformity   ASSESSMENT AND PLAN:   Diastolic CHF: ***  Would like to increase his Entresto .  I also do not see that he is ever had a workup for his diastolic dysfunction so I will get a PYP scan.   Essential hypertension:  ***  This will be managed in context of treating his heart failure.   Obstructive sleep apnea: He says he is compliant with his CPAP.   Aortic root dilation: This is mention on an echo in 2022 but I do not see evidence of this on the most recent CT in September 2023.  There was no evidence of aortic root dilatation on echo in October 2024.  ***   We will follow this up with echocardiography in the future.   Morbid obesity: ***   He has been counseled multiple times about weight loss with diet.  Follow up ***  Signed, Eilleen Grates, MD

## 2024-05-12 ENCOUNTER — Ambulatory Visit: Payer: Medicare (Managed Care) | Attending: Cardiology | Admitting: Cardiology

## 2024-05-12 DIAGNOSIS — I10 Essential (primary) hypertension: Secondary | ICD-10-CM

## 2024-05-12 DIAGNOSIS — I5032 Chronic diastolic (congestive) heart failure: Secondary | ICD-10-CM

## 2024-05-12 DIAGNOSIS — I77810 Thoracic aortic ectasia: Secondary | ICD-10-CM

## 2024-10-19 ENCOUNTER — Other Ambulatory Visit: Payer: Self-pay
